# Patient Record
Sex: Female | Born: 2000 | Race: White | Hispanic: Yes | Marital: Single | Smoking: Former smoker
Health system: Southern US, Community
[De-identification: ages and names within clinical notes are randomized; demographics above are authoritative.]

## PROBLEM LIST (undated history)

## (undated) DIAGNOSIS — Z789 Other specified health status: Secondary | ICD-10-CM

## (undated) DIAGNOSIS — F32A Depression, unspecified: Secondary | ICD-10-CM

## (undated) DIAGNOSIS — H539 Unspecified visual disturbance: Secondary | ICD-10-CM

## (undated) DIAGNOSIS — F329 Major depressive disorder, single episode, unspecified: Secondary | ICD-10-CM

## (undated) DIAGNOSIS — F419 Anxiety disorder, unspecified: Secondary | ICD-10-CM

## (undated) HISTORY — PX: WISDOM TOOTH EXTRACTION: SHX21

## (undated) HISTORY — DX: Depression, unspecified: F32.A

## (undated) HISTORY — DX: Major depressive disorder, single episode, unspecified: F32.9

---
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
## 2019-01-13 ENCOUNTER — Ambulatory Visit
Admission: EM | Admit: 2019-01-13 | Discharge: 2019-01-13 | Disposition: A | Discharge status: Home / Self Care | Payer: 59

## 2019-01-13 ENCOUNTER — Other Ambulatory Visit: Payer: Self-pay

## 2019-01-13 DIAGNOSIS — Z0000 Encounter for general adult medical examination without abnormal findings: Secondary | ICD-10-CM

## 2019-01-13 NOTE — Discharge Instructions (Signed)
This is a normal finding of the tongue The bumps are most likely due to some food irritant Follow up as needed for continued or worsening symptoms

## 2019-01-13 NOTE — ED Triage Notes (Signed)
Pt c/o white bumps to back of tongue x2 days, no pain at this time. Eating and drinking ok, just wants to know what it is.

## 2019-01-13 NOTE — ED Provider Notes (Signed)
[facility]    CSN: [id number] Arrival date & time: 01/13/19  1503     History   Chief Complaint Chief Complaint  Patient presents with  . bumps on tongue    HPI Gail [name] is a 18 y.o. female.   Pt is an 18 year old female that presents with bumps on the back of her tongue. She noticed this Saturday. These bumps area not painful. No associated fever, chills, sore throat, ear pain. She is able to eat and drink without any issues.   ROS per HPI      Past Medical History:  Diagnosis Date  . Anxiety   . Depression   . Medical history non-contributory   . Vision abnormalities     Patient Active Problem List   Diagnosis Date Noted  . Initiation of Depo Provera 09/24/2018  . MDD (major depressive disorder), single episode, severe with psychosis (HCC) 05/03/2018  . Severe major depression with psychotic features, mood-congruent (HCC) 04/28/2018  . Suicide ideation 04/28/2018  . GAD (generalized anxiety disorder) 04/28/2018    Past Surgical History:  Procedure Laterality Date  . WISDOM TOOTH EXTRACTION      OB History    Gravida  0   Para  0   Term  0   Preterm  0   AB  0   Living  0     SAB  0   TAB  0   Ectopic  0   Multiple  0   Live Births  0            Home Medications    Prior to Admission medications   Medication Sig Start Date End Date Taking? Authorizing Provider  ARIPiprazole (ABILIFY) 5 MG tablet Take 0.5 tablets (2.5 mg total) by mouth at bedtime. 05/04/18   Denzil Magnusonhomas, Lashunda, NP  escitalopram (LEXAPRO) 10 MG tablet Take 1 tablet (10 mg total) by mouth daily. 05/05/18   Denzil Magnusonhomas, Lashunda, NP  hydrOXYzine (ATARAX/VISTARIL) 25 MG tablet Take 1 tablet (25 mg total) by mouth at bedtime as needed and may repeat dose one time if needed for anxiety (insomnia.). 05/04/18   Denzil Magnusonhomas, Lashunda, NP  medroxyPROGESTERone (DEPO-PROVERA) 150 MG/ML injection Inject 1 mL (150 mg total) into the muscle every 3 (three)  months. 11/19/18   Genia DelLavoie, [place], MD  ondansetron (ZOFRAN-ODT) 4 MG disintegrating tablet Take 1 tablet (4 mg total) by mouth every 8 (eight) hours as needed for nausea or vomiting. 10/24/18   Wieters, Junius CreamerHallie C, PA-C    Family History Family History  Problem Relation Age of Onset  . Rheum arthritis Mother   . Lupus Mother   . Obesity Mother   . Hypertension Mother   . Healthy Sister   . Breast cancer Maternal Grandmother   . Breast cancer Cousin     Social History Social History   Tobacco Use  . Smoking status: Never Smoker  . Smokeless tobacco: Never Used  Substance Use Topics  . Alcohol use: Not Currently    Frequency: Never  . Drug use: Never     Allergies   Patient has no known allergies.   Review of Systems Review of Systems   Physical Exam Triage Vital Signs ED Triage Vitals  Enc Vitals Group     BP 01/13/19 1519 106/72     Pulse Rate 01/13/19 1519 75     Resp 01/13/19 1519 16     Temp 01/13/19 1519 98.1 F (36.7 C)  Temp Source 01/13/19 1519 Oral     SpO2 01/13/19 1519 98 %     Weight --      Height --      Head Circumference --      Peak Flow --      Pain Score 01/13/19 1538 5     Pain Loc --      Pain Edu? --      Excl. in GC? --    No data found.  Updated Vital Signs BP 106/72 (BP Location: Left Arm)   Pulse 75   Temp 98.1 F (36.7 C) (Oral)   Resp 16   SpO2 98%   Visual Acuity Right Eye Distance:   Left Eye Distance:   Bilateral Distance:    Right Eye Near:   Left Eye Near:    Bilateral Near:     Physical Exam Vitals signs and nursing note reviewed.  Constitutional:      Appearance: Normal appearance.  HENT:     Head: Normocephalic and atraumatic.     Mouth/Throat:     Lips: Pink. No lesions.     Mouth: Mucous membranes are moist. No oral lesions.     Tongue: No lesions. Tongue does not deviate from midline.     Palate: No mass and lesions.     Pharynx: Oropharynx is clear. No pharyngeal swelling, oropharyngeal  exudate, posterior oropharyngeal erythema or uvula swelling.     Tonsils: No tonsillar exudate or tonsillar abscesses. 0 on the right. 0 on the left.     Comments: Bumps noted to the back of tongue. Normal finding.  Neurological:     Mental Status: She is alert.      UC Treatments / Results  Labs (all labs ordered are listed, but only abnormal results are displayed) Labs Reviewed - No data to display  EKG None  Radiology No results found.  Procedures Procedures (including critical care time)  Medications Ordered in UC Medications - No data to display  Initial Impression / Assessment and Plan / UC Course  I have reviewed the triage vital signs and the nursing notes.  Pertinent labs & imaging results that were available during my care of the patient were reviewed by me and considered in my medical decision making (see chart for details).     Reassured patient this is a normal finding. Follow up as needed for continued or worsening symptoms  Final Clinical Impressions(s) / UC Diagnoses   Final diagnoses:  Normal exam     Discharge Instructions     This is a normal finding of the tongue The bumps are most likely due to some food irritant Follow up as needed for continued or worsening symptoms     ED Prescriptions    None     Controlled Substance Prescriptions [place] Controlled Substance Registry consulted? Not Applicable   Janace Aris, NP 01/13/19 [phone]

## 2019-05-27 ENCOUNTER — Encounter (HOSPITAL_COMMUNITY): Payer: Self-pay | Admitting: Emergency Medicine

## 2019-05-27 ENCOUNTER — Other Ambulatory Visit: Payer: Self-pay

## 2019-05-27 ENCOUNTER — Emergency Department (HOSPITAL_COMMUNITY): Payer: 59

## 2019-05-27 ENCOUNTER — Emergency Department (HOSPITAL_COMMUNITY)
Admission: EM | Admit: 2019-05-27 | Discharge: 2019-05-27 | Disposition: A | Discharge status: Home / Self Care | Payer: 59 | Attending: Emergency Medicine | Admitting: Emergency Medicine

## 2019-05-27 DIAGNOSIS — Z79899 Other long term (current) drug therapy: Secondary | ICD-10-CM | POA: Diagnosis not present

## 2019-05-27 DIAGNOSIS — R1013 Epigastric pain: Secondary | ICD-10-CM | POA: Diagnosis not present

## 2019-05-27 DIAGNOSIS — R112 Nausea with vomiting, unspecified: Secondary | ICD-10-CM

## 2019-05-27 DIAGNOSIS — R634 Abnormal weight loss: Secondary | ICD-10-CM | POA: Diagnosis not present

## 2019-05-27 DIAGNOSIS — R1011 Right upper quadrant pain: Secondary | ICD-10-CM

## 2019-05-27 LAB — CBC
HCT: 40.9 % (ref 36.0–46.0)
Hemoglobin: 13 g/dL (ref 12.0–15.0)
MCH: 30.6 pg (ref 26.0–34.0)
MCHC: 31.8 g/dL (ref 30.0–36.0)
MCV: 96.2 fL (ref 80.0–100.0)
Platelets: 318 10*3/uL (ref 150–400)
RBC: 4.25 MIL/uL (ref 3.87–5.11)
RDW: 12.2 % (ref 11.5–15.5)
WBC: 6.5 10*3/uL (ref 4.0–10.5)
nRBC: 0 % (ref 0.0–0.2)

## 2019-05-27 LAB — URINALYSIS, ROUTINE W REFLEX MICROSCOPIC
Bilirubin Urine: NEGATIVE
Glucose, UA: NEGATIVE mg/dL
Ketones, ur: NEGATIVE mg/dL
Leukocytes,Ua: NEGATIVE
Nitrite: NEGATIVE
Protein, ur: NEGATIVE mg/dL
Specific Gravity, Urine: 1.021 (ref 1.005–1.030)
pH: 6 (ref 5.0–8.0)

## 2019-05-27 LAB — COMPREHENSIVE METABOLIC PANEL
ALT: 13 U/L (ref 0–44)
AST: 15 U/L (ref 15–41)
Albumin: 4 g/dL (ref 3.5–5.0)
Alkaline Phosphatase: 46 U/L (ref 38–126)
Anion gap: 9 (ref 5–15)
BUN: 12 mg/dL (ref 6–20)
CO2: 26 mmol/L (ref 22–32)
Calcium: 9.3 mg/dL (ref 8.9–10.3)
Chloride: 102 mmol/L (ref 98–111)
Creatinine, Ser: 0.86 mg/dL (ref 0.44–1.00)
GFR calc Af Amer: >60 mL/min (ref >60)
GFR calc non Af Amer: >60 mL/min (ref >60)
Glucose, Bld: 88 mg/dL (ref 70–99)
Potassium: 3.9 mmol/L (ref 3.5–5.1)
Sodium: 137 mmol/L (ref 135–145)
Total Bilirubin: 1.6 mg/dL — ABNORMAL HIGH (ref 0.3–1.2)
Total Protein: 7 g/dL (ref 6.5–8.1)

## 2019-05-27 LAB — LIPASE, BLOOD: Lipase: 40 U/L (ref 11–51)

## 2019-05-27 LAB — I-STAT BETA HCG BLOOD, ED (MC, WL, AP ONLY): I-stat hCG, quantitative: <5 m[IU]/mL (ref <5)

## 2019-05-27 MED ORDER — FAMOTIDINE IN NACL 20-0.9 MG/50ML-% IV SOLN
20.0000 mg | Freq: Once | INTRAVENOUS | Status: AC
  Administered 2019-05-27: 20 mg via INTRAVENOUS
  Filled 2019-05-27: qty 50

## 2019-05-27 MED ORDER — FAMOTIDINE 20 MG PO TABS: 20.0000 mg | ORAL_TABLET | Freq: Every day | ORAL | 0 refills | Status: DC

## 2019-05-27 MED ORDER — SODIUM CHLORIDE 0.9 % IV BOLUS
1000.0000 mL | Freq: Once | INTRAVENOUS | Status: AC
  Administered 2019-05-27: 1000 mL via INTRAVENOUS

## 2019-05-27 MED ORDER — SODIUM CHLORIDE 0.9% FLUSH: 3.0000 mL | Freq: Once | INTRAVENOUS | Status: DC

## 2019-05-27 MED ORDER — PROMETHAZINE HCL 25 MG PO TABS: 25.0000 mg | ORAL_TABLET | Freq: Four times a day (QID) | ORAL | 0 refills | Status: DC | PRN

## 2019-05-27 MED ORDER — ONDANSETRON HCL 4 MG/2ML IJ SOLN
4.0000 mg | Freq: Once | INTRAMUSCULAR | Status: AC
  Administered 2019-05-27: 4 mg via INTRAVENOUS
  Filled 2019-05-27: qty 2

## 2019-05-27 NOTE — ED Triage Notes (Signed)
Pt complains of abd pain and vomiting that has been going on since Monday. Pt has lost weight 112lb-99lb in a week.

## 2019-05-27 NOTE — ED Provider Notes (Signed)
[facility] Note   CSN: [id number] Arrival date & time: 05/27/19  1700     History   Chief Complaint Chief Complaint  Patient presents with  . Emesis  . Abdominal Pain    HPI Gail [name] is a 18 y.o. female.     Pt presents to the ED today abdominal pain and n/v.  The pt said she's been having these sx for the past week.  She's lost weight (different scales).  There was a little bit of blood in her emesis today.  She initially went to UC who sent her here.  UC did a covid swab while there.  No f/c.  The pt has been taking Zofran, but it is not helping.     Past Medical History:  Diagnosis Date  . Anxiety   . Depression   . Medical history non-contributory   . Vision abnormalities     Patient Active Problem List   Diagnosis Date Noted  . Initiation of Depo Provera 09/24/2018  . MDD (major depressive disorder), single episode, severe with psychosis (HCC) 05/03/2018  . Severe major depression with psychotic features, mood-congruent (HCC) 04/28/2018  . Suicide ideation 04/28/2018  . GAD (generalized anxiety disorder) 04/28/2018    Past Surgical History:  Procedure Laterality Date  . WISDOM TOOTH EXTRACTION       OB History    Gravida  0   Para  0   Term  0   Preterm  0   AB  0   Living  0     SAB  0   TAB  0   Ectopic  0   Multiple  0   Live Births  0            Home Medications    Prior to Admission medications   Medication Sig Start Date End Date Taking? Authorizing Provider  ARIPiprazole (ABILIFY) 5 MG tablet Take 0.5 tablets (2.5 mg total) by mouth at bedtime. 05/04/18   Denzil Magnusonhomas, Lashunda, NP  escitalopram (LEXAPRO) 10 MG tablet Take 1 tablet (10 mg total) by mouth daily. 05/05/18   Denzil Magnusonhomas, Lashunda, NP  famotidine (PEPCID) 20 MG tablet Take 1 tablet (20 mg total) by mouth daily. 05/27/19   Jacalyn LefevreHaviland, Jireh Elmore, MD  hydrOXYzine (ATARAX/VISTARIL) 25 MG tablet Take 1 tablet (25 mg  total) by mouth at bedtime as needed and may repeat dose one time if needed for anxiety (insomnia.). 05/04/18   Denzil Magnusonhomas, Lashunda, NP  medroxyPROGESTERone (DEPO-PROVERA) 150 MG/ML injection Inject 1 mL (150 mg total) into the muscle every 3 (three) months. 11/19/18   Genia DelLavoie, Marie-Lyne, MD  ondansetron (ZOFRAN-ODT) 4 MG disintegrating tablet Take 1 tablet (4 mg total) by mouth every 8 (eight) hours as needed for nausea or vomiting. 10/24/18   Wieters, Hallie C, PA-C  promethazine (PHENERGAN) 25 MG tablet Take 1 tablet (25 mg total) by mouth every 6 (six) hours as needed for nausea or vomiting. 05/27/19   Jacalyn LefevreHaviland, Cia Garretson, MD    Family History Family History  Problem Relation Age of Onset  . Rheum arthritis Mother   . Lupus Mother   . Obesity Mother   . Hypertension Mother   . Healthy Sister   . Breast cancer Maternal Grandmother   . Breast cancer Cousin     Social History Social History   Tobacco Use  . Smoking status: Never Smoker  . Smokeless tobacco: Never Used  Substance Use Topics  . Alcohol use: Not Currently  Frequency: Never  . Drug use: Never     Allergies   Patient has no known allergies.   Review of Systems Review of Systems  Gastrointestinal: Positive for abdominal pain, nausea and vomiting.  All other systems reviewed and are negative.    Physical Exam Updated Vital Signs BP 113/73   Pulse 77   Temp 98.4 F (36.9 C) (Oral)   Resp 16   Ht 5\' 2"  (1.575 m)   Wt 44.9 kg   SpO2 100%   BMI 18.11 kg/m   Physical Exam Vitals signs and nursing note reviewed.  Constitutional:      Appearance: She is well-developed.  HENT:     Head: Normocephalic and atraumatic.  Eyes:     Extraocular Movements: Extraocular movements intact.     Pupils: Pupils are equal, round, and reactive to light.  Cardiovascular:     Rate and Rhythm: Normal rate and regular rhythm.  Pulmonary:     Effort: Pulmonary effort is normal.     Breath sounds: Normal breath sounds.   Abdominal:     General: Abdomen is flat and scaphoid. Bowel sounds are normal.     Palpations: Abdomen is soft.     Tenderness: There is abdominal tenderness in the epigastric area.  Skin:    General: Skin is warm.     Capillary Refill: Capillary refill takes less than 2 seconds.  Neurological:     General: No focal deficit present.     Mental Status: She is alert and oriented to person, place, and time.  Psychiatric:        Mood and Affect: Mood normal.        Behavior: Behavior normal.      ED Treatments / Results  Labs (all labs ordered are listed, but only abnormal results are displayed) Labs Reviewed  COMPREHENSIVE METABOLIC PANEL - Abnormal; Notable for the following components:      Result Value   Total Bilirubin 1.6 (*)    All other components within normal limits  URINALYSIS, ROUTINE W REFLEX MICROSCOPIC - Abnormal; Notable for the following components:   APPearance HAZY (*)    Hgb urine dipstick SMALL (*)    Bacteria, UA MANY (*)    All other components within normal limits  LIPASE, BLOOD  CBC  I-STAT BETA HCG BLOOD, ED ([facility], [facility], [facility] ONLY)    EKG None  Radiology Abdomen Limited Ruq  Result Date: 05/27/2019 CLINICAL DATA:  Right upper quadrant pain EXAM: ULTRASOUND ABDOMEN LIMITED RIGHT UPPER QUADRANT COMPARISON:  None. FINDINGS: Gallbladder: No gallstones or wall thickening visualized. No sonographic Murphy sign noted by sonographer. Common bile duct: Diameter: 2.3 mm Liver: No focal lesion identified. Within normal limits in parenchymal echogenicity. Portal vein is patent on color Doppler imaging with normal direction of blood flow towards the liver. Other: None. IMPRESSION: Normal gallbladder and liver. Electronically Signed   By: 07/27/2019 M.D.   On: 05/27/2019 22:13    Procedures Procedures (including critical care time)  Medications Ordered in ED Medications  sodium chloride flush (NS) 0.9 % injection 3 mL (3 mLs Intravenous Not Given 05/27/19 2004)   famotidine (PEPCID) IVPB 20 mg premix (has no administration in time range)  sodium chloride 0.9 % bolus 1,000 mL (1,000 mLs Intravenous New Bag/Given 05/27/19 2149)  ondansetron (ZOFRAN) injection 4 mg (4 mg Intravenous Given 05/27/19 2150)     Initial Impression / Assessment and Plan / ED Course  I have reviewed the triage vital  signs and the nursing notes.  Pertinent labs & imaging results that were available during my care of the patient were reviewed by me and considered in my medical decision making (see chart for details).   Pt is feeling better after treatment.    GB US nl.  Pt's blood in emesis likely a small mallory-weiss tear.  Pt has had this periodic n/v and epigastric abd pain for awhile.  No fever or elevation in wbc, so I don't think pt needs a ct scan.  Pt has never seen a GI doctor, so she will be referred to one.  She will also be started on pepcid.  Zofran is not helping, so I will put her on phenergan.  She knows to return if worse.  Final Clinical Impressions(s) / ED Diagnoses   Final diagnoses:  RUQ pain  Non-intractable vomiting with nausea, unspecified vomiting type    ED Discharge Orders         Ordered    Ambulatory referral to Gastroenterology     05/27/19 2227    famotidine (PEPCID) 20 MG tablet  Daily     05/27/19 2228    promethazine (PHENERGAN) 25 MG tablet  Every 6 hours PRN     05/27/19 2228           Isla Pence, MD 05/27/19 2229

## 2019-05-27 NOTE — ED Notes (Signed)
Patient verbalizes understanding of discharge instructions. Opportunity for questioning and answers were provided. Armband removed by staff, pt discharged from ED ambulatory to home.  

## 2019-05-27 NOTE — ED Notes (Signed)
Patient transported to Ultrasound 

## 2019-05-27 NOTE — ED Notes (Signed)
Urine Culture sent to Main Lab with UA 

## 2019-05-28 ENCOUNTER — Encounter: Payer: Self-pay | Admitting: Gastroenterology

## 2019-06-01 ENCOUNTER — Emergency Department (HOSPITAL_COMMUNITY)
Admission: EM | Admit: 2019-06-01 | Discharge: 2019-06-01 | Disposition: A | Discharge status: Home / Self Care | Payer: 59 | Attending: Emergency Medicine | Admitting: Emergency Medicine

## 2019-06-01 ENCOUNTER — Other Ambulatory Visit: Payer: Self-pay

## 2019-06-01 DIAGNOSIS — Z79899 Other long term (current) drug therapy: Secondary | ICD-10-CM | POA: Insufficient documentation

## 2019-06-01 DIAGNOSIS — R112 Nausea with vomiting, unspecified: Secondary | ICD-10-CM | POA: Insufficient documentation

## 2019-06-01 DIAGNOSIS — R1013 Epigastric pain: Secondary | ICD-10-CM | POA: Insufficient documentation

## 2019-06-01 LAB — COMPREHENSIVE METABOLIC PANEL
ALT: 14 U/L (ref 0–44)
AST: 19 U/L (ref 15–41)
Albumin: 4.1 g/dL (ref 3.5–5.0)
Alkaline Phosphatase: 47 U/L (ref 38–126)
Anion gap: 10 (ref 5–15)
BUN: 9 mg/dL (ref 6–20)
CO2: 23 mmol/L (ref 22–32)
Calcium: 9.7 mg/dL (ref 8.9–10.3)
Chloride: 103 mmol/L (ref 98–111)
Creatinine, Ser: 0.97 mg/dL (ref 0.44–1.00)
GFR calc Af Amer: >60 mL/min (ref >60)
GFR calc non Af Amer: >60 mL/min (ref >60)
Glucose, Bld: 89 mg/dL (ref 70–99)
Potassium: 3.8 mmol/L (ref 3.5–5.1)
Sodium: 136 mmol/L (ref 135–145)
Total Bilirubin: 2.5 mg/dL — ABNORMAL HIGH (ref 0.3–1.2)
Total Protein: 7.2 g/dL (ref 6.5–8.1)

## 2019-06-01 LAB — URINALYSIS, ROUTINE W REFLEX MICROSCOPIC
Bilirubin Urine: NEGATIVE
Glucose, UA: NEGATIVE mg/dL
Ketones, ur: NEGATIVE mg/dL
Leukocytes,Ua: NEGATIVE
Nitrite: NEGATIVE
Protein, ur: NEGATIVE mg/dL
Specific Gravity, Urine: 1.009 (ref 1.005–1.030)
pH: 6 (ref 5.0–8.0)

## 2019-06-01 LAB — I-STAT BETA HCG BLOOD, ED (MC, WL, AP ONLY): I-stat hCG, quantitative: <5 m[IU]/mL (ref <5)

## 2019-06-01 LAB — LIPASE, BLOOD: Lipase: 32 U/L (ref 11–51)

## 2019-06-01 LAB — CBC
HCT: 39.9 % (ref 36.0–46.0)
Hemoglobin: 13.2 g/dL (ref 12.0–15.0)
MCH: 31.1 pg (ref 26.0–34.0)
MCHC: 33.1 g/dL (ref 30.0–36.0)
MCV: 94.1 fL (ref 80.0–100.0)
Platelets: 300 10*3/uL (ref 150–400)
RBC: 4.24 MIL/uL (ref 3.87–5.11)
RDW: 12.2 % (ref 11.5–15.5)
WBC: 5.9 10*3/uL (ref 4.0–10.5)
nRBC: 0 % (ref 0.0–0.2)

## 2019-06-01 MED ORDER — ONDANSETRON 4 MG PO TBDP: 4.0000 mg | ORAL_TABLET | Freq: Three times a day (TID) | ORAL | 0 refills | Status: DC | PRN

## 2019-06-01 MED ORDER — SODIUM CHLORIDE 0.9% FLUSH: 3.0000 mL | Freq: Once | INTRAVENOUS | Status: DC

## 2019-06-01 NOTE — ED Provider Notes (Signed)
[facility] EMERGENCY DEPARTMENT Provider Note   CSN: [id number] Arrival date & time: 06/01/19  4970     History   Chief Complaint Chief Complaint  Patient presents with  . Abdominal Pain  . Emesis    HPI Gail [name] is a 18 y.o. female with a history of anxiety and depression presenting to the emergency department with abdominal pain and vomiting.  The patient reports is been going on for "weeks" the patient was seen on October 8 in our ER for similar symptoms, had negative workup including  RUQ ultrasound after noting isolated elevated T Bili level on blood testing.  Discharged home on pepcid.  Now returning complaining of continued difficulty eating.  Feels that everything she eats and drinks "Comes up" immediately.  States she has not kept anything down in "days."  Unsure of her last bowel movement.  She is passing gas.  She has epigastric pain that is cramping and comes and goes, does not radiate.  Denies fevers, chills, dysuria, hematuria.     HPI  Past Medical History:  Diagnosis Date  . Anxiety   . Depression   . Medical history non-contributory   . Vision abnormalities     Patient Active Problem List   Diagnosis Date Noted  . Initiation of Depo Provera 09/24/2018  . MDD (major depressive disorder), single episode, severe with psychosis ([place]) 05/03/2018  . Severe major depression with psychotic features, mood-congruent ([place]) 04/28/2018  . Suicide ideation 04/28/2018  . GAD (generalized anxiety disorder) 04/28/2018    Past Surgical History:  Procedure Laterality Date  . WISDOM TOOTH EXTRACTION       OB History    Gravida  0   Para  0   Term  0   Preterm  0   AB  0   Living  0     SAB  0   TAB  0   Ectopic  0   Multiple  0   Live Births  0            Home Medications    Prior to Admission medications   Medication Sig Start Date End Date Taking? Authorizing Provider  ARIPiprazole (ABILIFY) 5 MG  tablet Take 0.5 tablets (2.5 mg total) by mouth at bedtime. 05/04/18   Mordecai Maes, NP  escitalopram (LEXAPRO) 10 MG tablet Take 1 tablet (10 mg total) by mouth daily. 05/05/18   Mordecai Maes, NP  famotidine (PEPCID) 20 MG tablet Take 1 tablet (20 mg total) by mouth daily. 05/27/19   Isla Pence, MD  hydrOXYzine (ATARAX/VISTARIL) 25 MG tablet Take 1 tablet (25 mg total) by mouth at bedtime as needed and may repeat dose one time if needed for anxiety (insomnia.). 05/04/18   Mordecai Maes, NP  medroxyPROGESTERone (DEPO-PROVERA) 150 MG/ML injection Inject 1 mL (150 mg total) into the muscle every 3 (three) months. 11/19/18   Princess Bruins, MD  ondansetron (ZOFRAN ODT) 4 MG disintegrating tablet Take 1 tablet (4 mg total) by mouth every 8 (eight) hours as needed for up to 15 doses for nausea or vomiting. 06/01/19   Wyvonnia Dusky, MD  ondansetron (ZOFRAN-ODT) 4 MG disintegrating tablet Take 1 tablet (4 mg total) by mouth every 8 (eight) hours as needed for nausea or vomiting. 10/24/18   Wieters, Hallie C, PA-C  promethazine (PHENERGAN) 25 MG tablet Take 1 tablet (25 mg total) by mouth every 6 (six) hours as needed for nausea or vomiting. 05/27/19   Isla Pence,  MD    Family History Family History  Problem Relation Age of Onset  . Rheum arthritis Mother   . Lupus Mother   . Obesity Mother   . Hypertension Mother   . Healthy Sister   . Breast cancer Maternal Grandmother   . Breast cancer Cousin     Social History Social History   Tobacco Use  . Smoking status: Never Smoker  . Smokeless tobacco: Never Used  Substance Use Topics  . Alcohol use: Not Currently    Frequency: Never  . Drug use: Never     Allergies   Patient has no known allergies.   Review of Systems Review of Systems  Constitutional: Negative for chills and fever.  Respiratory: Negative for cough and shortness of breath.   Cardiovascular: Negative for chest pain and palpitations.  Gastrointestinal:  Positive for abdominal pain, nausea and vomiting. Negative for diarrhea.  Genitourinary: Negative for dysuria and hematuria.  Musculoskeletal: Negative for arthralgias and back pain.  Skin: Negative for pallor and rash.  Neurological: Negative for syncope and light-headedness.  All other systems reviewed and are negative.    Physical Exam Updated Vital Signs BP 113/89 (BP Location: Left Arm)   Pulse 67   Temp 98.4 F (36.9 C) (Oral)   Resp 16   SpO2 100%   Physical Exam Vitals signs and nursing note reviewed.  Constitutional:      General: She is not in acute distress.    Appearance: She is well-developed.     Comments: Flat affect, does not make eye contact  HENT:     Head: Normocephalic and atraumatic.  Eyes:     Conjunctiva/sclera: Conjunctivae normal.  Neck:     Musculoskeletal: Neck supple.  Cardiovascular:     Rate and Rhythm: Normal rate and regular rhythm.     Heart sounds: No murmur.  Pulmonary:     Effort: Pulmonary effort is normal. No respiratory distress.     Breath sounds: Normal breath sounds.  Abdominal:     Palpations: Abdomen is soft.     Tenderness: There is abdominal tenderness (mild) in the epigastric area. There is no right CVA tenderness, left CVA tenderness, guarding or rebound. Negative signs include Murphy's sign, Rovsing's sign and McBurney's sign.  Skin:    General: Skin is warm and dry.  Neurological:     Mental Status: She is alert.      ED Treatments / Results  Labs (all labs ordered are listed, but only abnormal results are displayed) Labs Reviewed  COMPREHENSIVE METABOLIC PANEL - Abnormal; Notable for the following components:      Result Value   Total Bilirubin 2.5 (*)    All other components within normal limits  URINALYSIS, ROUTINE W REFLEX MICROSCOPIC - Abnormal; Notable for the following components:   Hgb urine dipstick SMALL (*)    Bacteria, UA MANY (*)    All other components within normal limits  LIPASE, BLOOD  CBC   I-STAT BETA HCG BLOOD, ED ([facility], [facility], [facility] ONLY)    EKG None  Radiology No results found.  Procedures Procedures (including critical care time)  Medications Ordered in ED Medications - No data to display   Initial Impression / Assessment and Plan / ED Course  I have reviewed the triage vital signs and the nursing notes.  Pertinent labs & imaging results that were available during my care of the patient were reviewed by me and considered in my medical decision making (see chart for details).  18 yo female presenting with recurrent ED visit for epigastric abdominal pain and nausea vomiting, triggered by eating.  Symptoms and presentation most consistent with gastroparesis. She is quite comfortable and well appearing on exam. Her labs show no evidence of dehydration or electrolyte abnormalities to correlate with her statement that she has not been able to eat or drink anything at all in "days."  I explained that this is likely a chronic process, she needs to see a GI doctor.  We can offer her zofran for nausea.  Keep drinking fluids, milkshakes, Ensure or Boost, and avoid heavy foods.  Pregnancy negative No leukocytosis or fever on labs today or previously to suggest acute intraabdominal process such as appendicitis  RUQ negative on last visit, low suspicion for cholecystitis    Will discharge.  Clinical Course as of Jun 01 1935  Tue Jun 01, 2019  1119 I-stat hCG, quantitative: <5.0 [MT]  1119 WBC: 5.9 [MT]  1119 Ultrasound RUQ on 10/8 showed: FINDINGS: Gallbladder:  No gallstones or wall thickening visualized. No sonographic Murphy sign noted by sonographer.  Common bile duct:  Diameter: 2.3 mm   [MT]    Clinical Course User Index [MT] Terald Sleeper, MD     Final Clinical Impressions(s) / ED Diagnoses   Final diagnoses:  Epigastric pain  Non-intractable vomiting with nausea, unspecified vomiting type    ED Discharge Orders         Ordered    ondansetron  (ZOFRAN ODT) 4 MG disintegrating tablet  Every 8 hours PRN     06/01/19 1142           Terald Sleeper, MD 06/01/19 1941

## 2019-06-01 NOTE — ED Notes (Signed)
Patient Alert and oriented to baseline. Stable and ambulatory to baseline. Patient verbalized understanding of the discharge instructions.  Patient belongings were taken by the patient.   

## 2019-06-01 NOTE — ED Triage Notes (Signed)
Pt reports ongoing abdominal pain and emesis over weekend. Denies recent fever. Had COVID test last Thursday. Doesn't have results. VSS. NAD at present.

## 2019-06-01 NOTE — Discharge Instructions (Addendum)
It is possible you have gastroparesis.  You should see a GI doctor for this.  Please call back their office and let them know you were seen in the ER twice, and ask if they can move up your office appointment.  At home, eat fluids and smoothies, liquids and soft food.  Eat SLOWLY.    Your bloodwork did not show signs of dehydration or infection.

## 2019-06-07 ENCOUNTER — Ambulatory Visit (INDEPENDENT_AMBULATORY_CARE_PROVIDER_SITE_OTHER): Payer: 59 | Admitting: Nurse Practitioner

## 2019-06-07 ENCOUNTER — Encounter: Payer: Self-pay | Admitting: Nurse Practitioner

## 2019-06-07 ENCOUNTER — Other Ambulatory Visit: Payer: Self-pay

## 2019-06-07 VITALS — BP 102/80 | HR 75 | Temp 98.3°F | Ht 61.89 in | Wt 99.8 lb

## 2019-06-07 DIAGNOSIS — Z23 Encounter for immunization: Secondary | ICD-10-CM | POA: Diagnosis not present

## 2019-06-07 DIAGNOSIS — R1013 Epigastric pain: Secondary | ICD-10-CM

## 2019-06-07 DIAGNOSIS — Z30013 Encounter for initial prescription of injectable contraceptive: Secondary | ICD-10-CM

## 2019-06-07 DIAGNOSIS — K5904 Chronic idiopathic constipation: Secondary | ICD-10-CM

## 2019-06-07 MED ORDER — MEDROXYPROGESTERONE ACETATE 150 MG/ML IM SUSP
150.0000 mg | Freq: Once | INTRAMUSCULAR | Status: AC
  Administered 2019-06-07: 150 mg via INTRAMUSCULAR

## 2019-06-07 MED ORDER — ONDANSETRON 4 MG PO TBDP: 4.0000 mg | ORAL_TABLET | Freq: Three times a day (TID) | ORAL | 0 refills | Status: DC | PRN

## 2019-06-07 MED ORDER — FAMOTIDINE 20 MG PO TABS: 20.0000 mg | ORAL_TABLET | Freq: Two times a day (BID) | ORAL | 0 refills | Status: DC

## 2019-06-07 MED ORDER — DOCUSATE SODIUM 100 MG PO CAPS: 100.0000 mg | ORAL_CAPSULE | Freq: Every day | ORAL | 0 refills | Status: DC

## 2019-06-07 NOTE — Progress Notes (Signed)
Subjective:  Patient ID: Gail [name], female    DOB: 19-Aug-2001  Age: 18 y.o. MRN: [id number]  CC: Establish Care (est care/follow up from ED/still has abd pain,nausea,vomitting,not able to eat,contipiation/goingon 2 wks/ cant sleep at night)  HPI Gail [name] is accompanied by her mother-Tanya. GI symptoms: chronic, ongoing for over 1year, worsening in last 59months. Reports anorexia, nausea, vomiting, ABD pain (describes as fullness), and constipation. Had 2 ED visits in last 2weeks. labs and radiology report reviewed: all normal except elevated bilirubin. She has not tried zofran prior to food. Gail [name] state she si only able to tolerate jello. She is unable to remember last BM, use of OTC laxative with no results.  Anxiety and Depression: Admits to non compliance with abilify, lexapro and vistaril due to side effects. Reports excessive daytime somnolence with all medications. Has not taken any in last 71months. Medications were previous by [facility]. Last seen by psychiatry 20months ago. Has appt with therapist. Has difficulty completing school while staying on campus. Hx of inpatient admission due to suicidal thoughts and auditory hallucinations. Hx of attempted suicide: overdose on mediation. She denies any ETOH or illicit drug or tobacco use. She is not sexually active at this time. Depression screen [facility] 2/9 06/07/2019 06/07/2019 09/23/2018  Decreased Interest 2 3 1   Down, Depressed, Hopeless 1 3 1   PHQ - 2 Score 3 6 2   Altered sleeping 3 - 3  Tired, decreased energy 3 - 1  Change in appetite 3 - 0  Feeling bad or failure about yourself  1 - 1  Trouble concentrating 3 - 0  Moving slowly or fidgety/restless 0 - 0  Suicidal thoughts 0 - 0  PHQ-9 Score 16 - 7   GAD 7 : Generalized Anxiety Score 06/07/2019 09/23/2018  Nervous, Anxious, on Edge 2 1  Control/stop worrying 2 1  Worry too much - different things 1 1  Trouble relaxing 3 1  Restless 2 0   Easily annoyed or irritable 3 1  Afraid - awful might happen 1 1  Total GAD 7 Score 14 6   Contraception management: Last depo Provera injection 10/2018, administered by GYN, last pregnancy test done in Ed 06/01/2019 (negative), LMP 79months ago.  Reviewed past Medical, Social and Family history today.  Outpatient Medications Prior to Visit  Medication Sig Dispense Refill  . promethazine (PHENERGAN) 25 MG tablet Take 1 tablet (25 mg total) by mouth every 6 (six) hours as needed for nausea or vomiting. 30 tablet 0  . ondansetron (ZOFRAN-ODT) 4 MG disintegrating tablet Take 1 tablet (4 mg total) by mouth every 8 (eight) hours as needed for nausea or vomiting. 20 tablet 0  . ARIPiprazole (ABILIFY) 5 MG tablet Take 0.5 tablets (2.5 mg total) by mouth at bedtime. (Patient not taking: Reported on 06/07/2019) 30 tablet 0  . escitalopram (LEXAPRO) 10 MG tablet Take 1 tablet (10 mg total) by mouth daily. (Patient not taking: Reported on 06/07/2019) 30 tablet 0  . hydrOXYzine (ATARAX/VISTARIL) 25 MG tablet Take 1 tablet (25 mg total) by mouth at bedtime as needed and may repeat dose one time if needed for anxiety (insomnia.). (Patient not taking: Reported on 06/07/2019) 30 tablet 0  . medroxyPROGESTERone (DEPO-PROVERA) 150 MG/ML injection Inject 1 mL (150 mg total) into the muscle every 3 (three) months. (Patient not taking: Reported on 06/07/2019) 1 mL 4  . famotidine (PEPCID) 20 MG tablet Take 1 tablet (20 mg total) by mouth daily. (Patient not taking: Reported on  06/07/2019) 30 tablet 0  . ondansetron (ZOFRAN ODT) 4 MG disintegrating tablet Take 1 tablet (4 mg total) by mouth every 8 (eight) hours as needed for up to 15 doses for nausea or vomiting. (Patient not taking: Reported on 06/07/2019) 15 tablet 0   No facility-administered medications prior to visit.     ROS See HPI  Objective:  BP 102/80   Pulse 75   Temp 98.3 F (36.8 C) (Tympanic)   Ht 5' 1.89" (1.572 m)   Wt 99 lb 12.8 oz (45.3  kg)   SpO2 97%   BMI 18.32 kg/m   BP Readings from Last 3 Encounters:  06/07/19 102/80  06/01/19 113/89  05/27/19 118/82    Wt Readings from Last 3 Encounters:  06/07/19 99 lb 12.8 oz (45.3 kg) (4 %, Z= -1.77)*  05/27/19 99 lb (44.9 kg) (3 %, Z= -1.84)*  11/19/18 103 lb (46.7 kg) (8 %, Z= -1.42)*   * Growth percentiles are based on CDC (Girls, 2-20 Years) data.   Physical Exam Neck:     Musculoskeletal: Normal range of motion and neck supple.  Cardiovascular:     Pulses: Normal pulses.     Heart sounds: Normal heart sounds.  Pulmonary:     Effort: Pulmonary effort is normal.     Breath sounds: Normal breath sounds.  Lymphadenopathy:     Cervical: No cervical adenopathy.    Lab Results  Component Value Date   WBC 5.9 06/01/2019   HGB 13.2 06/01/2019   HCT 39.9 06/01/2019   PLT 300 06/01/2019   GLUCOSE 89 06/01/2019   CHOL 125 04/28/2018   TRIG 58 04/28/2018   HDL 51 04/28/2018   LDLCALC 62 04/28/2018   ALT 14 06/01/2019   AST 19 06/01/2019   NA 136 06/01/2019   K 3.8 06/01/2019   CL 103 06/01/2019   CREATININE 0.97 06/01/2019   BUN 9 06/01/2019   CO2 23 06/01/2019   TSH 1.611 04/28/2018   HGBA1C 4.8 04/28/2018   Assessment & Plan:   Gail Folksmanda was seen today for establish care.  Diagnoses and all orders for this visit:  Initiation of Depo Provera -     medroxyPROGESTERone (DEPO-PROVERA) injection 150 mg  Dyspepsia -     famotidine (PEPCID) 20 MG tablet; Take 1 tablet (20 mg total) by mouth 2 (two) times daily. -     ondansetron (ZOFRAN ODT) 4 MG disintegrating tablet; Take 1 tablet (4 mg total) by mouth every 8 (eight) hours as needed for nausea or vomiting.  Chronic idiopathic constipation -     docusate sodium (COLACE) 100 MG capsule; Take 1 capsule (100 mg total) by mouth daily.  Need for influenza vaccination -     Flu Vaccine QUAD 36+ mos IM   I have discontinued Gail [name] ondansetron. I have also changed her famotidine and  ondansetron. Additionally, I am having her start on docusate sodium. Lastly, I am having her maintain her ARIPiprazole, escitalopram, hydrOXYzine, medroxyPROGESTERone, and promethazine. We administered medroxyPROGESTERone.  Meds ordered this encounter  Medications  . docusate sodium (COLACE) 100 MG capsule    Sig: Take 1 capsule (100 mg total) by mouth daily.    Dispense:  10 capsule    Refill:  0    Order Specific Question:   Supervising Provider    Answer:   Dianne DunARON, TALIA M [[id number]]  . famotidine (PEPCID) 20 MG tablet    Sig: Take 1 tablet (20 mg total) by mouth 2 (two) times  daily.    Dispense:  30 tablet    Refill:  0    Order Specific Question:   Supervising Provider    Answer:   Lucille Passy [[id number]]  . ondansetron (ZOFRAN ODT) 4 MG disintegrating tablet    Sig: Take 1 tablet (4 mg total) by mouth every 8 (eight) hours as needed for nausea or vomiting.    Dispense:  30 tablet    Refill:  0    Order Specific Question:   Supervising Provider    Answer:   Lucille Passy [[id number]]  . medroxyPROGESTERone (DEPO-PROVERA) injection 150 mg    Problem List Items Addressed This Visit      Other   Initiation of Depo Provera - Primary    Other Visit Diagnoses    Dyspepsia       Relevant Medications   famotidine (PEPCID) 20 MG tablet   ondansetron (ZOFRAN ODT) 4 MG disintegrating tablet   Chronic idiopathic constipation       Relevant Medications   docusate sodium (COLACE) 100 MG capsule   Need for influenza vaccination       Relevant Orders   Flu Vaccine QUAD 36+ mos IM (Completed)      Follow-up: Return if symptoms worsen or fail to improve.  Wilfred Lacy, NP

## 2019-06-07 NOTE — Patient Instructions (Addendum)
Maintain small frequent meals. Take zofran 15-39mins prior to food intake Start famotidine BID. Start colace daily. Maintain appt with GI.  Return to office in 8months for repeat depoprovera injection.  Make appt with [facility]ry asap: [facility] in [place]. Maintain appt with therapist.

## 2019-06-09 ENCOUNTER — Other Ambulatory Visit: Payer: Self-pay

## 2019-06-09 ENCOUNTER — Encounter: Payer: Self-pay | Admitting: Gastroenterology

## 2019-06-09 ENCOUNTER — Other Ambulatory Visit (INDEPENDENT_AMBULATORY_CARE_PROVIDER_SITE_OTHER): Payer: 59

## 2019-06-09 ENCOUNTER — Encounter: Payer: Self-pay | Admitting: Nurse Practitioner

## 2019-06-09 ENCOUNTER — Ambulatory Visit (INDEPENDENT_AMBULATORY_CARE_PROVIDER_SITE_OTHER): Payer: 59 | Admitting: Gastroenterology

## 2019-06-09 VITALS — BP 98/54 | HR 62 | Temp 98.1°F | Ht 61.0 in | Wt 101.0 lb

## 2019-06-09 DIAGNOSIS — R112 Nausea with vomiting, unspecified: Secondary | ICD-10-CM | POA: Diagnosis not present

## 2019-06-09 DIAGNOSIS — K5900 Constipation, unspecified: Secondary | ICD-10-CM

## 2019-06-09 DIAGNOSIS — K920 Hematemesis: Secondary | ICD-10-CM

## 2019-06-09 LAB — HEPATIC FUNCTION PANEL
ALT: 9 U/L (ref 0–35)
AST: 12 U/L (ref 0–37)
Albumin: 4.3 g/dL (ref 3.5–5.2)
Alkaline Phosphatase: 47 U/L (ref 47–119)
Bilirubin, Direct: 0.3 mg/dL (ref 0.0–0.3)
Total Bilirubin: 1.5 mg/dL — ABNORMAL HIGH (ref 0.3–1.2)
Total Protein: 6.9 g/dL (ref 6.0–8.3)

## 2019-06-09 LAB — BILIRUBIN, DIRECT: Bilirubin, Direct: 0.3 mg/dL (ref 0.0–0.3)

## 2019-06-09 MED ORDER — PANTOPRAZOLE SODIUM 40 MG PO TBEC: 40.0000 mg | DELAYED_RELEASE_TABLET | Freq: Two times a day (BID) | ORAL | 3 refills | Status: DC

## 2019-06-09 MED ORDER — LINACLOTIDE 145 MCG PO CAPS: 145.0000 ug | ORAL_CAPSULE | Freq: Every day | ORAL | 1 refills | Status: DC

## 2019-06-09 NOTE — Progress Notes (Signed)
Referring Provider: Bing Neighbors, FNP Primary Care Physician:  Anne Ng, NP  Reason for Consultation:  Nausea, vomiting, and abdomina pain   IMPRESSION:  Nausea and vomiting and midepigastric abdominal pain x 3 weeks Hematemesis x 1 Chronic constipation Elevated bilirubin with otherwise normal liver enzymes  Etiology of nausea, vomiting and midepigastric pain is unclear given the normal ultrasound. Differential includes H pylori, PUD, reflux esophagitis, gastritis. They may also be related to her constipation, although she does not feel there is a temporal association.  Discussed UGI and H pylori versus EGD. Patient and her mother strongly prefer EGD.  Will start empiric PPI therapy and work to treat her constipation.   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 Will repeat hepatic function panel today including a direct bilirubin.     PLAN: Pantoprazole 40 mg daily Hepatic function panel, direct bilirubin EGD High fiber diet, drink plenty of liquids, add daily stool bulking agent Trial of Linzess 145 mcg daily to treat her constipation  Please see the "Patient Instructions" section for addition details about the plan.  HPI: Gail [name] is a 18 y.o. female referred by the ED for further evaluation of abdominal pain, nausea, and vomiting. The history is obtained through the patient, review of her medical record, and her mother accompanies her to this appointment. She has a history of anxiety and depression, but using her Lexapro on a PRN basis.  3 weeks of sharp, non-radiating, mid-abdominal pain described as a "stomach ache."  Nausea and vomiting after eating, although nausea is present when she awakes. Intermittent blood in the emesis. Associated dizziness, sitophobia, early satiety and fullness/bloating. Symptoms can be so severe that it "doubles her over" and is unable to walk when most severe. No change in symptoms with eating, defecation,  or movement. Smelling food escalates her symptoms. Her Mom often finds her crying in the morning due to the pain.   Associated weight loss because she doesn't feel liks she is keeping any food down. Has lost 10 pounds. No appetite. Ate [employer] nuggets and fries prior to her appointment today but has  felt nausea since eating it. Symptoms are particularly worse with fried or greasy. Does not eat diary because she does not like it.  Eats jello. No other associated symptoms. No identified exacerbating or relieving features.   Seen in the ED 10/8/ and 10/13 for these symptoms. RUQ was normal. Labs including liver enzymes and lipase were normal except for a bilirubin.  Prescribed Pepcid, promethazine, and ondansetron. Taking them as standing doses. Pretreating 30 minutes prior to meals has not provided any relief.  No relief with PeptoBismal or ginger ale, either.   Chronic constipation. Has a bowel movement once or twice weekly. Currently taking Colace. No associated of constipation with nausea.   Prior abdominal imaging: Abdominal ultrasound 05/27/19: normal gallbladder and liver  No known family history of colon cancer or polyps. No family history of uterine/endometrial cancer, pancreatic cancer or gastric/stomach cancer.   Past Medical History:  Diagnosis Date  . Anxiety   . Depression   . Medical history non-contributory   . Vision abnormalities     Past Surgical History:  Procedure Laterality Date  . WISDOM TOOTH EXTRACTION      Current Outpatient Medications  Medication Sig Dispense Refill  . ARIPiprazole (ABILIFY) 5 MG tablet Take 0.5 tablets (2.5 mg total) by mouth at bedtime. 30 tablet 0  . docusate sodium (COLACE) 100 MG capsule Take 1 capsule (100 mg total)  by mouth daily. 10 capsule 0  . escitalopram (LEXAPRO) 10 MG tablet Take 1 tablet (10 mg total) by mouth daily. 30 tablet 0  . famotidine (PEPCID) 20 MG tablet Take 1 tablet (20 mg total) by mouth 2 (two) times daily. 30  tablet 0  . hydrOXYzine (ATARAX/VISTARIL) 25 MG tablet Take 1 tablet (25 mg total) by mouth at bedtime as needed and may repeat dose one time if needed for anxiety (insomnia.). 30 tablet 0  . medroxyPROGESTERone (DEPO-PROVERA) 150 MG/ML injection Inject 1 mL (150 mg total) into the muscle every 3 (three) months. 1 mL 4  . ondansetron (ZOFRAN ODT) 4 MG disintegrating tablet Take 1 tablet (4 mg total) by mouth every 8 (eight) hours as needed for nausea or vomiting. 30 tablet 0  . promethazine (PHENERGAN) 25 MG tablet Take 1 tablet (25 mg total) by mouth every 6 (six) hours as needed for nausea or vomiting. 30 tablet 0  . linaclotide (LINZESS) 145 MCG CAPS capsule Take 1 capsule (145 mcg total) by mouth daily before breakfast. 30 capsule 1  . pantoprazole (PROTONIX) 40 MG tablet Take 1 tablet (40 mg total) by mouth 2 (two) times daily. 30 tablet 3   No current facility-administered medications for this visit.     Allergies as of 06/09/2019  . (No Known Allergies)    Family History  Problem Relation Age of Onset  . Rheum arthritis Mother   . Lupus Mother   . Obesity Mother   . Hypertension Mother   . Kidney disease Mother   . Healthy Sister   . Breast cancer Maternal Grandmother   . Cancer Maternal Grandmother        breast cancer  . Diabetes Maternal Grandmother   . Hypertension Maternal Grandmother   . Hyperlipidemia Maternal Grandmother   . Breast cancer Cousin   . Diabetes Father     Social History   Socioeconomic History  . Marital status: Single    Spouse name: Not on file  . Number of children: Not on file  . Years of education: Not on file  . Highest education level: Not on file  Occupational History  . Not on file  Social Needs  . Financial resource strain: Not on file  . Food insecurity    Worry: Not on file    Inability: Not on file  . Transportation needs    Medical: Not on file    Non-medical: Not on file  Tobacco Use  . Smoking status: Never Smoker  .  Smokeless tobacco: Never Used  Substance and Sexual Activity  . Alcohol use: Not Currently    Frequency: Never  . Drug use: Never  . Sexual activity: Not Currently    Comment: 1st intercourse- 16, partners- 2,   Lifestyle  . Physical activity    Days per week: Not on file    Minutes per session: Not on file  . Stress: Not on file  Relationships  . Social Herbalist on phone: Not on file    Gets together: Not on file    Attends religious service: Not on file    Active member of club or organization: Not on file    Attends meetings of clubs or organizations: Not on file    Relationship status: Not on file  . Intimate partner violence    Fear of current or ex partner: Not on file    Emotionally abused: Not on file    Physically abused: Not  on file    Forced sexual activity: Not on file  Other Topics Concern  . Not on file  Social History Narrative  . Not on file    Review of Systems: 12 system ROS is negative except as noted above with the additions of anxiety, depression, headaches, and insomnia.   Physical Exam: General:   Alert,  well-nourished, pleasant and cooperative in NAD Head:  Normocephalic and atraumatic. Eyes:  Sclera clear, no icterus.   Conjunctiva pink. Ears:  Normal auditory acuity. Nose:  No deformity, discharge,  or lesions. Mouth:  No deformity or lesions.   Neck:  Supple; no masses or thyromegaly. Lungs:  Clear throughout to auscultation.   No wheezes. Heart:  Regular rate and rhythm; no murmurs. Abdomen:  Soft,nontender, nondistended, normal bowel sounds, no rebound or guarding. No hepatosplenomegaly.   Rectal:  Deferred  Msk:  Symmetrical. No boney deformities LAD: No inguinal or umbilical LAD Extremities:  No clubbing or edema. Neurologic:  Alert and  oriented x4;  grossly nonfocal Skin:  Intact without significant lesions or rashes. Psych:  Alert and cooperative. Normal mood and affect.   Anola Mcgough L. Orvan FalconerBeavers, MD, MPH 06/09/2019,  8:35 PM

## 2019-06-09 NOTE — Patient Instructions (Addendum)
We have sent the following medications to your pharmacy for you to pick up at your convenience: Pantoprazole 40 mg daily Linzess 145 mcg daily  Your provider has requested that you go to the basement level for lab work before leaving today. Press "B" on the elevator. The lab is located at the first door on the left as you exit the elevator.  You have been scheduled for an endoscopy. Please follow written instructions given to you at your visit today. If you use inhalers (even only as needed), please bring them with you on the day of your procedure.

## 2019-06-10 ENCOUNTER — Ambulatory Visit (AMBULATORY_SURGERY_CENTER): Payer: 59 | Admitting: Gastroenterology

## 2019-06-10 ENCOUNTER — Other Ambulatory Visit: Payer: Self-pay

## 2019-06-10 ENCOUNTER — Encounter: Payer: Self-pay | Admitting: Gastroenterology

## 2019-06-10 ENCOUNTER — Encounter: Payer: Self-pay | Admitting: *Deleted

## 2019-06-10 VITALS — BP 103/68 | HR 89 | Temp 98.2°F | Resp 22 | Ht 61.0 in | Wt 101.0 lb

## 2019-06-10 DIAGNOSIS — K219 Gastro-esophageal reflux disease without esophagitis: Secondary | ICD-10-CM

## 2019-06-10 DIAGNOSIS — R112 Nausea with vomiting, unspecified: Secondary | ICD-10-CM

## 2019-06-10 MED ORDER — SODIUM CHLORIDE 0.9 % IV SOLN: 500.0000 mL | Freq: Once | INTRAVENOUS | Status: DC

## 2019-06-10 NOTE — Patient Instructions (Signed)
Thank you for allowing [place] to care for you today!  Await biopsy results, approximately 2 weeks.  We will either send a letter or call, depending on results.  Resume previous diet and medications today.  CONTINUE TO TAKE PANTOPRAZOLE 40 mg BY MOUTH EVERY MORNING.  Return to GI clinic in 3-4 weeks.  We will call to make this appointment with you.    YOU HAD AN ENDOSCOPIC PROCEDURE TODAY AT [facility]:   Refer to the procedure report that was given to you for any specific questions about what was found during the examination.  If the procedure report does not answer your questions, please call your gastroenterologist to clarify.  If you requested that your care partner not be given the details of your procedure findings, then the procedure report has been included in a sealed envelope for you to review at your convenience later.  YOU SHOULD EXPECT: Some feelings of bloating in the abdomen. Passage of more gas than usual.  Walking can help get rid of the air that was put into your GI tract during the procedure and reduce the bloating. If you had a lower endoscopy (such as a colonoscopy or flexible sigmoidoscopy) you may notice spotting of blood in your stool or on the toilet paper. If you underwent a bowel prep for your procedure, you may not have a normal bowel movement for a few days.  Please Note:  You might notice some irritation and congestion in your nose or some drainage.  This is from the oxygen used during your procedure.  There is no need for concern and it should clear up in a day or so.  SYMPTOMS TO REPORT IMMEDIATELY:   Following upper endoscopy (EGD)  Vomiting of blood or coffee ground material  New chest pain or pain under the shoulder blades  Painful or persistently difficult swallowing  New shortness of breath  Fever of 100F or higher  Black, tarry-looking stools  For urgent or emergent issues, a gastroenterologist can be reached at any hour by calling  [phone].   DIET:  We do recommend a small meal at first, but then you may proceed to your regular diet.  Drink plenty of fluids but you should avoid alcoholic beverages for 24 hours.  ACTIVITY:  You should plan to take it easy for the rest of today and you should NOT DRIVE or use heavy machinery until tomorrow (because of the sedation medicines used during the test).    FOLLOW UP: Our staff will call the number listed on your records 48-72 hours following your procedure to check on you and address any questions or concerns that you may have regarding the information given to you following your procedure. If we do not reach you, we will leave a message.  We will attempt to reach you two times.  During this call, we will ask if you have developed any symptoms of COVID 19. If you develop any symptoms (ie: fever, flu-like symptoms, shortness of breath, cough etc.) before then, please call [phone].  If you test positive for Covid 19 in the 2 weeks post procedure, please call and report this information to [place].    If any biopsies were taken you will be contacted by phone or by letter within the next 1-3 weeks.  Please call us at [phone] if you have not heard about the biopsies in 3 weeks.    SIGNATURES/CONFIDENTIALITY: You and/or your care partner have signed paperwork which will be  entered into your electronic medical record.  These signatures attest to the fact that that the information above on your After Visit Summary has been reviewed and is understood.  Full responsibility of the confidentiality of this discharge information lies with you and/or your care-partner. 

## 2019-06-10 NOTE — Progress Notes (Signed)
PT taken to PACU. Monitors in place. VSS. Report given to RN. 

## 2019-06-10 NOTE — Progress Notes (Signed)
Called to room to assist during endoscopic procedure.  Patient ID and intended procedure confirmed with present staff. Received instructions for my participation in the procedure from the performing physician.  

## 2019-06-10 NOTE — Op Note (Signed)
[facility] Patient Name: Delaina Fetsch Procedure Date: 06/10/2019 3:09 PM MRN: [id number] Endoscopist: Thornton Park MD, MD Age: 18 Referring MD:  Date of Birth: 07-30-2001 Gender: Female Account #: [id number] Procedure:                Upper GI endoscopy Indications:              Nausea with vomiting                           Midepigastric abdominal pain x 3 weeks                           Hematemesis x 1 Medicines:                See the Anesthesia note for documentation of the                            administered medications Procedure:                Pre-Anesthesia Assessment:                           - Prior to the procedure, a History and Physical                            was performed, and patient medications and                            allergies were reviewed. The patient's tolerance of                            previous anesthesia was also reviewed. The risks                            and benefits of the procedure and the sedation                            options and risks were discussed with the patient.                            All questions were answered, and informed consent                            was obtained. Prior Anticoagulants: The patient has                            taken no previous anticoagulant or antiplatelet                            agents. ASA Grade Assessment: I - A normal, healthy                            patient. After reviewing the risks and benefits,  the patient was deemed in satisfactory condition to                            undergo the procedure.                           After obtaining informed consent, the endoscope was                            passed under direct vision. Throughout the                            procedure, the patient's blood pressure, pulse, and                            oxygen saturations were monitored continuously. The   Endoscope was introduced through the mouth, and                            advanced to the third part of duodenum. The upper                            GI endoscopy was accomplished without difficulty.                            The patient tolerated the procedure well. Scope In: Scope Out: Findings:                 The examined esophagus was normal. Biopsies were                            obtained from the proximal and distal esophagus                            with cold forceps for histology of suspected                            eosinophilic esophagitis.                           The entire examined stomach was normal. Biopsies                            were taken with a cold forceps for histology.                            Estimated blood loss was minimal.                           The examined duodenum was normal. Biopsies were                            taken with a cold forceps for histology. Estimated  blood loss was minimal.                           The cardia and gastric fundus were normal on                            retroflexion.                           The exam was otherwise without abnormality. Complications:            No immediate complications. Estimated blood loss:                            Minimal. Estimated Blood Loss:     Estimated blood loss was minimal. Impression:               - Normal esophagus. Biopsied.                           - Normal stomach. Biopsied.                           - Normal examined duodenum. Biopsied.                           - The examination was otherwise normal. Recommendation:           - Patient has a contact number available for                            emergencies. The signs and symptoms of potential                            delayed complications were discussed with the                            patient. Return to normal activities tomorrow.                            Written discharge  instructions were provided to the                            patient.                           - Resume previous diet.                           - Continue present medications including                            pantoprazole 40 mg every morning.                           - Await pathology results.                           -  Return to GI clinic in 3-4 weeks. Tressia DanasKimberly Laurajean Hosek MD, MD 06/10/2019 3:47:23 PM This report has been signed electronically.

## 2019-06-14 ENCOUNTER — Telehealth: Payer: Self-pay

## 2019-06-14 NOTE — Telephone Encounter (Signed)
  Follow up Call-  Call back number 06/10/2019  Post procedure Call Back phone  # [phone]  Permission to leave phone message Yes     Patient questions:  Do you have a fever, pain , or abdominal swelling? No. Pain Score  0 *  Have you tolerated food without any problems? Yes.    Have you been able to return to your normal activities? Yes.    Do you have any questions about your discharge instructions: Diet   No. Medications  No. Follow up visit  No.  Do you have questions or concerns about your Care? No.  Actions: * If pain score is 4 or above: No action needed, pain <4.  1. Have you developed a fever since your procedure? no  2.   Have you had an respiratory symptoms (SOB or cough) since your procedure? no  3.   Have you tested positive for COVID 19 since your procedure no  4.   Have you had any family members/close contacts diagnosed with the COVID 19 since your procedure?  no   If yes to any of these questions please route to Joylene John, RN and Alphonsa Gin, Therapist, sports.

## 2019-06-14 NOTE — Telephone Encounter (Signed)
No answer, left message to call back later today, B.Jamarrius Salay RN. 

## 2019-06-15 ENCOUNTER — Encounter: Payer: Self-pay | Admitting: Gastroenterology

## 2019-06-16 ENCOUNTER — Encounter: Payer: Self-pay | Admitting: *Deleted

## 2019-07-07 ENCOUNTER — Ambulatory Visit: Payer: 59 | Admitting: Gastroenterology

## 2019-07-08 ENCOUNTER — Encounter: Payer: 59 | Admitting: Gastroenterology

## 2019-07-21 ENCOUNTER — Other Ambulatory Visit: Payer: Self-pay | Admitting: Gastroenterology

## 2019-07-27 ENCOUNTER — Ambulatory Visit: Payer: 59 | Admitting: Gastroenterology

## 2019-07-30 ENCOUNTER — Encounter: Payer: Self-pay | Admitting: Gastroenterology

## 2019-07-30 ENCOUNTER — Ambulatory Visit (INDEPENDENT_AMBULATORY_CARE_PROVIDER_SITE_OTHER): Payer: 59 | Admitting: Gastroenterology

## 2019-07-30 VITALS — BP 96/60 | HR 80 | Temp 97.9°F | Ht 61.0 in | Wt 101.2 lb

## 2019-07-30 DIAGNOSIS — K920 Hematemesis: Secondary | ICD-10-CM | POA: Diagnosis not present

## 2019-07-30 DIAGNOSIS — K5900 Constipation, unspecified: Secondary | ICD-10-CM

## 2019-07-30 DIAGNOSIS — R112 Nausea with vomiting, unspecified: Secondary | ICD-10-CM

## 2019-07-30 NOTE — Progress Notes (Signed)
Referring Provider: Flossie Buffy, NP Primary Care Physician:  Flossie Buffy, NP  Reason for Consultation:  Nausea, vomiting, and abdomina pain   IMPRESSION:  Reflux esophagitis on EGD 06/09/19     - esophageal biopsies negative for eosinophilic esophagitis Nausea and vomiting and midepigastric abdominal pain likely due to reflux esophagitis    - normal abdominal ultrasound 05/27/19    - gastric biopsies negative for H pylori, duodenal biopsies negative for celiac Chronic constipation Gilbert's syndrome - isolated elevated bilirubin with otherwise normal liver enzymes  Etiology of nausea, vomiting and midepigastric pain may be due to reflux esophagitis given her clinical improvement on BID PPI and H2Blocker.  Differential also includes functional dyspepsia and symptomatic biliary dyskinesia. Gastroparesis seems less likely.   HIDA with CCK recommended to excluded symptomatic biliary dyskinesia.   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 No further work-up needed at this time.    PLAN: Continue pantoprazole 40 mg BID and famotidine 20 mg BID (3 months refill today) Add a daily stool bulking agent such as Metamucil HIDA scan with CCK Follow-up after the HIDA scan  Please see the "Patient Instructions" section for addition details about the plan.  HPI: Gail [name] is a 18 y.o. female initially referred by the ED for further evaluation of acute onset of sharp, non-radiating, mid-abdominal pain, with associated nausea, early satiety, bloating and vomiting that had resulted in an unintentional 10 pounds weight loss. The interval history is obtained through the patient, review of her electronic health health record, and her mother who accompanies her to this appointment. She also has a history of anxiety and depression, but using her Lexapro on a PRN basis. She also has a history of chronic constipation with a bowel movement once or twice weekly.    Prior work-up in the ED included a RUQ ultrasound, normal liver enzymes except for a bilirubin, and normal lipase.  Initially seen in consultation  06/09/19. She had a normal EGD 06/10/19. Esophageal biopsies showed reflux without eosinophilic esophagitis. Gastric and duodenal biopsies were normal. There was no H pylori.   Labs to follow-up on the hyperbilirubinemia on 06/09/19: TB 1.5, DB 0.3, AST 12, ALT 9, alk phos 47  Returns in follow-up taking famotidine and pantoprazole BID. Uses phenergan as needed for nausea.   Symptoms have largely improved. Symptoms return when she eats large meals. Certain food will trigger her symptoms, particularly fast food. Symptoms are currently diffuse, sharp, non-radiating mid-abdominal pain that develops within 30 minutes of eating and and lasts for up to 2 hours.  Did not like Linzess because of the risk for diarrhea interfering with her normal routine.    No new complaints or concerns.  Weight is stable compared to her visit from 06/09/19.   Prior abdominal imaging: Abdominal ultrasound 05/27/19: normal gallbladder and liver  Endoscopy history: EGD 06/10/19: Normal. Esophageal biopsies showed reflux without eosinophilic esophagitis. Gastric and duodenal biopsies were normal. There was no H pylori.   Past Medical History:  Diagnosis Date  . Anxiety   . Depression   . Medical history non-contributory   . Vision abnormalities     Past Surgical History:  Procedure Laterality Date  . WISDOM TOOTH EXTRACTION      Current Outpatient Medications  Medication Sig Dispense Refill  . famotidine (PEPCID) 20 MG tablet Take 1 tablet (20 mg total) by mouth 2 (two) times daily. 30 tablet 0  . medroxyPROGESTERone (DEPO-PROVERA) 150 MG/ML injection Inject 1 mL (150 mg total) into  the muscle every 3 (three) months. 1 mL 4  . ondansetron (ZOFRAN ODT) 4 MG disintegrating tablet Take 1 tablet (4 mg total) by mouth every 8 (eight) hours as needed for nausea or  vomiting. 30 tablet 0  . pantoprazole (PROTONIX) 40 MG tablet Take 1 tablet (40 mg total) by mouth 2 (two) times daily. 180 tablet 3  . promethazine (PHENERGAN) 25 MG tablet Take 1 tablet (25 mg total) by mouth every 6 (six) hours as needed for nausea or vomiting. 30 tablet 0   No current facility-administered medications for this visit.    Allergies as of 07/30/2019  . (No Known Allergies)    Family History  Problem Relation Age of Onset  . Rheum arthritis Mother   . Lupus Mother   . Obesity Mother   . Hypertension Mother   . Kidney disease Mother   . Healthy Sister   . Breast cancer Maternal Grandmother   . Cancer Maternal Grandmother        breast cancer  . Diabetes Maternal Grandmother   . Hypertension Maternal Grandmother   . Hyperlipidemia Maternal Grandmother   . Breast cancer Cousin   . Diabetes Father     Social History   Socioeconomic History  . Marital status: Single    Spouse name: Not on file  . Number of children: Not on file  . Years of education: Not on file  . Highest education level: Not on file  Occupational History  . Not on file  Tobacco Use  . Smoking status: Never Smoker  . Smokeless tobacco: Never Used  Substance and Sexual Activity  . Alcohol use: Not Currently  . Drug use: Never  . Sexual activity: Not Currently    Comment: 1st intercourse- 16, partners- 2,   Other Topics Concern  . Not on file  Social History Narrative  . Not on file   Social Determinants of Health   Financial Resource Strain:   . Difficulty of Paying Living Expenses: Not on file  Food Insecurity:   . Worried About Charity fundraiser in the Last Year: Not on file  . Ran Out of Food in the Last Year: Not on file  Transportation Needs:   . Lack of Transportation (Medical): Not on file  . Lack of Transportation (Non-Medical): Not on file  Physical Activity:   . Days of Exercise per Week: Not on file  . Minutes of Exercise per Session: Not on file  Stress:   .  Feeling of Stress : Not on file  Social Connections:   . Frequency of Communication with Friends and Family: Not on file  . Frequency of Social Gatherings with Friends and Family: Not on file  . Attends Religious Services: Not on file  . Active Member of Clubs or Organizations: Not on file  . Attends Archivist Meetings: Not on file  . Marital Status: Not on file  Intimate Partner Violence:   . Fear of Current or Ex-Partner: Not on file  . Emotionally Abused: Not on file  . Physically Abused: Not on file  . Sexually Abused: Not on file     Physical Exam: General:   Alert,  well-nourished, pleasant and cooperative in NAD. Appears her stated age.  Head:  Normocephalic and atraumatic. Eyes:  Sclera clear, no icterus.   Conjunctiva pink. Ears:  Normal auditory acuity. Nose:  No deformity, discharge,  or lesions. Mouth:  No deformity or lesions.   Neck:  Supple; no masses  or thyromegaly. Abdomen:  Soft, thin, nontender, nondistended, normal bowel sounds, no rebound or guarding. I am unable to reproduce her pain or symptoms.  No hepatosplenomegaly.   LAD: No inguinal or umbilical LAD Extremities:  No clubbing or edema. Neurologic:  Alert and  oriented x4;  grossly nonfocal Skin:  No obvious rash or bruise Psych:  Alert and cooperative. Normal mood and affect.   Gaylen Venning L. Tarri Glenn, MD, MPH 07/30/2019, 9:06 AM

## 2019-07-30 NOTE — Patient Instructions (Signed)
Continue Pantoprazole 40 mg twice a day and famotidine 20 mg twice a day.    Metamucil, 1 tablespoon once or twice daily can be used to keep bowels regular if needed.  You have been scheduled for a HIDA scan at [facility] (1st floor) on 08-09-2019. Please arrive 15 minutes prior to your scheduled appointment at  6:57 am. Make certain not to have anything to eat or drink at least 6 hours prior to your test. Should this appointment date or time not work well for you, please call radiology scheduling at [phone].  _____________________________________________________________________ hepatobiliary (HIDA) scan is an imaging procedure used to diagnose problems in the liver, gallbladder and bile ducts. In the HIDA scan, a radioactive chemical or tracer is injected into a vein in your arm. The tracer is handled by the liver like bile. Bile is a fluid produced and excreted by your liver that helps your digestive system break down fats in the foods you eat. Bile is stored in your gallbladder and the gallbladder releases the bile when you eat a meal. A special nuclear medicine scanner (gamma camera) tracks the flow of the tracer from your liver into your gallbladder and small intestine.  During your HIDA scan  You'll be asked to change into a hospital gown before your HIDA scan begins. Your health care team will position you on a table, usually on your back. The radioactive tracer is then injected into a vein in your arm.The tracer travels through your bloodstream to your liver, where it's taken up by the bile-producing cells. The radioactive tracer travels with the bile from your liver into your gallbladder and through your bile ducts to your small intestine.You may feel some pressure while the radioactive tracer is injected into your vein. As you lie on the table, a special gamma camera is positioned over your abdomen taking pictures of the tracer as it moves through your body. The gamma camera takes  pictures continually for about an hour. You'll need to keep still during the HIDA scan. This can become uncomfortable, but you may find that you can lessen the discomfort by taking deep breaths and thinking about other things. Tell your health care team if you're uncomfortable. The radiologist will watch on a computer the progress of the radioactive tracer through your body. The HIDA scan may be stopped when the radioactive tracer is seen in the gallbladder and enters your small intestine. This typically takes about an hour. In some cases extra imaging will be performed if original images aren't satisfactory, if morphine is given to help visualize the gallbladder or if the medication CCK is given to look at the contraction of the gallbladder. This test typically takes 2 hours to complete. ________________________________________________________________________

## 2019-08-10 ENCOUNTER — Other Ambulatory Visit: Payer: Self-pay

## 2019-08-10 ENCOUNTER — Encounter (HOSPITAL_COMMUNITY)
Admission: RE | Admit: 2019-08-10 | Discharge: 2019-08-10 | Disposition: A | Discharge status: Home / Self Care | Payer: Managed Care, Other (non HMO) | Source: Ambulatory Visit | Attending: Gastroenterology | Admitting: Gastroenterology

## 2019-08-10 DIAGNOSIS — R112 Nausea with vomiting, unspecified: Secondary | ICD-10-CM | POA: Insufficient documentation

## 2019-08-10 MED ORDER — TECHNETIUM TC 99M MEBROFENIN IV KIT
5.4000 | PACK | Freq: Once | INTRAVENOUS | Status: AC
  Administered 2019-08-10: 5.4 via INTRAVENOUS

## 2019-08-31 ENCOUNTER — Other Ambulatory Visit: Payer: Self-pay

## 2019-09-01 ENCOUNTER — Ambulatory Visit (INDEPENDENT_AMBULATORY_CARE_PROVIDER_SITE_OTHER): Payer: No Typology Code available for payment source

## 2019-09-01 DIAGNOSIS — Z3042 Encounter for surveillance of injectable contraceptive: Secondary | ICD-10-CM

## 2019-09-01 MED ORDER — MEDROXYPROGESTERONE ACETATE 150 MG/ML IM SUSP: 150.0000 mg | Freq: Once | INTRAMUSCULAR | Status: DC

## 2019-09-01 MED ORDER — MEDROXYPROGESTERONE ACETATE 150 MG/ML IM SUSY: PREFILLED_SYRINGE | Freq: Once | INTRAMUSCULAR | Status: AC

## 2019-09-01 NOTE — Progress Notes (Signed)
Per orders of Charlotte Nche  Injection Depo provera of  given by Novalyn Lajara L Jamare Vanatta in Left Glute. Patient tolerated injection well. Patient will make appointment for 3 months, between 11/17/19-12/01/19.

## 2019-09-01 NOTE — Progress Notes (Signed)
Medical screening examination/treatment/procedure(s) were performed by the CMA. As primary care provider I was immediately available for consulation/collaboration. I agree with above documentation. Riggin Cuttino, AGNP-C 

## 2019-09-01 NOTE — Patient Instructions (Signed)
Health Maintenance Due  Topic Date Due  . TETANUS/TDAP  07/05/2019    Depression screen [facility] 2/9 06/07/2019 06/07/2019 09/23/2018  Decreased Interest 2 3 1   Down, Depressed, Hopeless 1 3 1   PHQ - 2 Score 3 6 2   Altered sleeping 3 - 3  Tired, decreased energy 3 - 1  Change in appetite 3 - 0  Feeling bad or failure about yourself  1 - 1  Trouble concentrating 3 - 0  Moving slowly or fidgety/restless 0 - 0  Suicidal thoughts 0 - 0  PHQ-9 Score 16 - 7

## 2019-11-22 ENCOUNTER — Other Ambulatory Visit: Payer: Self-pay

## 2019-11-23 MED ORDER — MEDROXYPROGESTERONE ACETATE 150 MG/ML IM SUSP: 150.0000 mg | Freq: Once | INTRAMUSCULAR | Status: DC

## 2019-11-23 NOTE — Progress Notes (Signed)
This encounter was created in error - please disregard.

## 2019-11-26 ENCOUNTER — Other Ambulatory Visit: Payer: Self-pay

## 2019-11-26 ENCOUNTER — Ambulatory Visit (INDEPENDENT_AMBULATORY_CARE_PROVIDER_SITE_OTHER): Payer: 59 | Admitting: Obstetrics & Gynecology

## 2019-11-26 ENCOUNTER — Encounter: Payer: Self-pay | Admitting: Obstetrics & Gynecology

## 2019-11-26 VITALS — BP 116/72 | Ht 61.0 in | Wt 109.0 lb

## 2019-11-26 DIAGNOSIS — Z113 Encounter for screening for infections with a predominantly sexual mode of transmission: Secondary | ICD-10-CM

## 2019-11-26 DIAGNOSIS — Z3042 Encounter for surveillance of injectable contraceptive: Secondary | ICD-10-CM | POA: Diagnosis not present

## 2019-11-26 DIAGNOSIS — Z01419 Encounter for gynecological examination (general) (routine) without abnormal findings: Secondary | ICD-10-CM

## 2019-11-26 NOTE — Patient Instructions (Signed)
1. Well female exam with routine gynecological exam Normal gynecologic exam.  Will start Pap tests at age 19.  Breasts normal.  BMI 20.6.  Walks her dog.  Healthy nutrition.  2. Surveillance for Depo-Provera contraception Well on Depo-Provera IM q3 mths x 09/2018.  Prescribed and administered by Fam MD.  3. Screen for STD (sexually transmitted disease) Strict condom use strongly recommended.   - Gono-Chlam done - HIV antibody (with reflex) - RPR - Hepatitis C Antibody - Hepatitis B Surface AntiGEN  Gail [name], it was a pleasure seeing you today!  I will inform you of your results as soon as they are available.

## 2019-11-26 NOTE — Progress Notes (Signed)
    Gail [name] [birth year] [id number]   History:    19 y.o. G0 Single.  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  RP:  Established patient presenting for annual gyn exam   HPI:  Well on DepoProvera started 09/2018.  No BTB.  No pelvic pain.  Had IC with condoms in the past, not currently sexually active.  STI screen Negative 09/2018.  Breasts normal. BMI 20.6.  Physically active.   Past medical history,surgical history, family history and social history were all reviewed and documented in the EPIC chart.  Gynecologic History No LMP recorded. Patient has had an injection.  Obstetric History OB History  Gravida Para Term Preterm AB Living  0 0 0 0 0 0  SAB TAB Ectopic Multiple Live Births  0 0 0 0 0     ROS: A ROS was performed and pertinent positives and negatives are included in the history.  GENERAL: No fevers or chills. HEENT: No change in vision, no earache, sore throat or sinus congestion. NECK: No pain or stiffness. CARDIOVASCULAR: No chest pain or pressure. No palpitations. PULMONARY: No shortness of breath, cough or wheeze. GASTROINTESTINAL: No abdominal pain, nausea, vomiting or diarrhea, melena or bright red blood per rectum. GENITOURINARY: No urinary frequency, urgency, hesitancy or dysuria. MUSCULOSKELETAL: No joint or muscle pain, no back pain, no recent trauma. DERMATOLOGIC: No rash, no itching, no lesions. ENDOCRINE: No polyuria, polydipsia, no heat or cold intolerance. No recent change in weight. HEMATOLOGICAL: No anemia or easy bruising or bleeding. NEUROLOGIC: No headache, seizures, numbness, tingling or weakness. PSYCHIATRIC: No depression, no loss of interest in normal activity or change in sleep pattern.     Exam:   BP 116/72   Ht 5\' 1"  (1.549 m)   Wt 109 lb (49.4 kg)   BMI 20.60 kg/m   Body mass index is 20.6 kg/m.  General appearance : Well developed well nourished female. No acute distress HEENT: Eyes: no retinal hemorrhage or exudates,  Neck supple,  trachea midline, no carotid bruits, no thyroidmegaly Lungs: Clear to auscultation, no rhonchi or wheezes, or rib retractions  Heart: Regular rate and rhythm, no murmurs or gallops Breast:Examined in sitting and supine position were symmetrical in appearance, no palpable masses or tenderness,  no skin retraction, no nipple inversion, no nipple discharge, no skin discoloration, no axillary or supraclavicular lymphadenopathy Abdomen: no palpable masses or tenderness, no rebound or guarding Extremities: no edema or skin discoloration or tenderness  Pelvic: Vulva: Normal             Vagina: No gross lesions or discharge  Cervix: No gross lesions or discharge.  Gono-Chlam done.  Uterus  AV, normal size, shape and consistency, non-tender and mobile  Adnexa  Without masses or tenderness  Anus: Normal   Assessment/Plan:  19 y.o. female for annual exam   1. Well female exam with routine gynecological exam Normal gynecologic exam.  Will start Pap tests at age 68.  Breasts normal.  BMI 20.6.  Walks her dog.  Healthy nutrition.  2. Surveillance for Depo-Provera contraception Well on Depo-Provera IM q3 mths x 09/2018.  Prescribed and administered by Fam MD.  3. Screen for STD (sexually transmitted disease) Strict condom use strongly recommended.   - Gono-Chlam done - HIV antibody (with reflex) - RPR - Hepatitis C Antibody - Hepatitis B Surface AntiGEN  10/2018 MD, 10:30 AM 11/26/2019

## 2019-11-26 NOTE — Addendum Note (Signed)
Addended by: Dayna Barker on: 11/26/2019 10:58 AM   Modules accepted: Orders

## 2019-11-27 LAB — C. TRACHOMATIS/N. GONORRHOEAE RNA
C. trachomatis RNA, TMA: NOT DETECTED
N. gonorrhoeae RNA, TMA: NOT DETECTED

## 2019-11-29 LAB — HEPATITIS C ANTIBODY
Hepatitis C Ab: NONREACTIVE
SIGNAL TO CUT-OFF: 0.01 (ref <1.00)

## 2019-11-29 LAB — HEPATITIS B SURFACE ANTIGEN: Hepatitis B Surface Ag: NONREACTIVE

## 2019-11-29 LAB — RPR: RPR Ser Ql: NONREACTIVE

## 2019-11-29 LAB — HIV ANTIBODY (ROUTINE TESTING W REFLEX): HIV 1&2 Ab, 4th Generation: NONREACTIVE

## 2019-12-01 ENCOUNTER — Ambulatory Visit: Payer: Self-pay

## 2019-12-02 ENCOUNTER — Other Ambulatory Visit: Payer: Self-pay

## 2019-12-02 ENCOUNTER — Ambulatory Visit (INDEPENDENT_AMBULATORY_CARE_PROVIDER_SITE_OTHER): Payer: 59

## 2019-12-02 DIAGNOSIS — Z30013 Encounter for initial prescription of injectable contraceptive: Secondary | ICD-10-CM | POA: Diagnosis not present

## 2019-12-02 LAB — POCT URINE PREGNANCY: Preg Test, Ur: NEGATIVE

## 2019-12-02 MED ORDER — MEDROXYPROGESTERONE ACETATE 150 MG/ML IM SUSY
PREFILLED_SYRINGE | Freq: Once | INTRAMUSCULAR | Status: AC
  Administered 2019-12-02: 150 mg via INTRAMUSCULAR

## 2019-12-02 NOTE — Progress Notes (Signed)
After obtaining consent, and per orders of [facility], injection of Depo given left glute by [place]. Patient instructed to remain in clinic for 20 minutes afterwards, and to report any adverse reaction to me immediately.

## 2019-12-03 ENCOUNTER — Encounter: Payer: 59 | Admitting: Nurse Practitioner

## 2019-12-03 ENCOUNTER — Other Ambulatory Visit: Payer: Self-pay

## 2019-12-03 ENCOUNTER — Encounter: Payer: Self-pay | Admitting: Nurse Practitioner

## 2019-12-03 NOTE — Progress Notes (Signed)
Medical screening examination/treatment/procedure(s) were performed by the CMA. As primary care provider I was immediately available for consulation/collaboration. I agree with above documentation. Devanny Palecek, AGNP-C 

## 2019-12-05 NOTE — Progress Notes (Signed)
This encounter was created in error - please disregard.

## 2019-12-07 ENCOUNTER — Ambulatory Visit: Payer: 59 | Admitting: Nurse Practitioner

## 2019-12-22 ENCOUNTER — Encounter: Payer: Self-pay | Admitting: Nurse Practitioner

## 2019-12-22 ENCOUNTER — Other Ambulatory Visit: Payer: Self-pay

## 2019-12-22 ENCOUNTER — Ambulatory Visit (INDEPENDENT_AMBULATORY_CARE_PROVIDER_SITE_OTHER): Payer: 59 | Admitting: Nurse Practitioner

## 2019-12-22 VITALS — BP 112/62 | HR 91 | Temp 97.2°F | Ht 61.0 in | Wt 110.0 lb

## 2019-12-22 DIAGNOSIS — R519 Headache, unspecified: Secondary | ICD-10-CM | POA: Diagnosis not present

## 2019-12-22 NOTE — Patient Instructions (Addendum)
Please contact GI to schedule f/up appt.  Work on time management to allow for adequate sleep. Use blue light glass during computer time. Ok to use tylenol 500mg  1tab every 8hrs as needed.  Decrease shaving frequency to avoid skin irritation Use miconazole power twice a day in underarms x 1week. Change blade after each use.  General Headache Without Cause A headache is pain or discomfort that is felt around the head or neck area. There are many causes and types of headaches. In some cases, the cause may not be found. Follow these instructions at home: Watch your condition for any changes. Let your doctor know about them. Take these steps to help with your condition: Managing pain      Take over-the-counter and prescription medicines only as told by your doctor.  Lie down in a dark, quiet room when you have a headache.  If told, put ice on your head and neck area: ? Put ice in a plastic bag. ? Place a towel between your skin and the bag. ? Leave the ice on for 20 minutes, 2-3 times per day.  If told, put heat on the affected area. Use the heat source that your doctor recommends, such as a moist heat pack or a heating pad. ? Place a towel between your skin and the heat source. ? Leave the heat on for 20-30 minutes. ? Remove the heat if your skin turns bright red. This is very important if you are unable to feel pain, heat, or cold. You may have a greater risk of getting burned.  Keep lights dim if bright lights bother you or make your headaches worse. Eating and drinking  Eat meals on a regular schedule.  If you drink alcohol: ? Limit how much you use to:  0-1 drink a day for women.  0-2 drinks a day for men. ? Be aware of how much alcohol is in your drink. In the [place] one drink equals one 12 oz bottle of beer (355 mL), one 5 oz glass of wine (148 mL), or one 1 oz glass of hard liquor (44 mL).  Stop drinking caffeine, or reduce how much caffeine you drink. General  instructions   Keep a journal to find out if certain things bring on headaches. For example, write down: ? What you eat and drink. ? How much sleep you get. ? Any change to your diet or medicines.  Get a massage or try other ways to relax.  Limit stress.  Sit up straight. Do not tighten (tense) your muscles.  Do not use any products that contain nicotine or tobacco. This includes cigarettes, e-cigarettes, and chewing tobacco. If you need help quitting, ask your doctor.  Exercise regularly as told by your doctor.  Get enough sleep. This often means 7-9 hours of sleep each night.  Keep all follow-up visits as told by your doctor. This is important. Contact a doctor if:  Your symptoms are not helped by medicine.  You have a headache that feels different than the other headaches.  You feel sick to your stomach (nauseous) or you throw up (vomit).  You have a fever. Get help right away if:  Your headache gets very bad quickly.  Your headache gets worse after a lot of physical activity.  You keep throwing up.  You have a stiff neck.  You have trouble seeing.  You have trouble speaking.  You have pain in the eye or ear.  Your muscles are weak or you lose  muscle control.  You lose your balance or have trouble walking.  You feel like you will pass out (faint) or you pass out.  You are mixed up (confused).  You have a seizure. Summary  A headache is pain or discomfort that is felt around the head or neck area.  There are many causes and types of headaches. In some cases, the cause may not be found.  Keep a journal to help find out what causes your headaches. Watch your condition for any changes. Let your doctor know about them.  Contact a doctor if you have a headache that is different from usual, or if your headache is not helped by medicine.  Get help right away if your headache gets very bad, you throw up, you have trouble seeing, you lose your balance, or you  have a seizure. This information is not intended to replace advice given to you by your health care provider. Make sure you discuss any questions you have with your health care provider. Document Revised: 02/23/2018 Document Reviewed: 02/23/2018 Elsevier Patient Education  [place].

## 2019-12-22 NOTE — Progress Notes (Signed)
Subjective:  Patient ID: Gail [name], female    DOB: 26-Feb-2001  Age: 19 y.o. MRN: [id number]  CC: Abdominal Pain (Pt not fasting//abdominal pain in the center of stomach more when Gail [name] eats//bothered her since April//pepid helps some) and Migraine (last week been geting more headaches//they seem to be getting worse and makes her somach hurt worse)  Headache  This is a new problem. The current episode started in the past 7 days. The problem occurs constantly. The problem has been unchanged. The pain is located in the frontal region. The pain does not radiate. The pain quality is similar to prior headaches. The quality of the pain is described as dull. Associated symptoms include nausea. Pertinent negatives include no abdominal pain, abnormal behavior, anorexia, back pain, blurred vision, eye pain, eye redness, eye watering, facial sweating, fever, hearing loss, insomnia, loss of balance, muscle aches, phonophobia, photophobia, rhinorrhea, scalp tenderness, seizures, sinus pressure, tingling, tinnitus, visual change, vomiting or weakness. The symptoms are aggravated by emotional stress, bright light and fatigue. Gail [name] has tried nothing for the symptoms. There is no history of cluster headaches, hypertension, immunosuppression, migraine headaches, migraines in the family, obesity, recent head traumas or sinus disease.  triggered by increased demand with school work (final exam material), which has led to late bedtime. And prolonged time on her labtop.  Reviewed past Medical, Social and Family history today.  Outpatient Medications Prior to Visit  Medication Sig Dispense Refill  . famotidine (PEPCID) 20 MG tablet Take 1 tablet (20 mg total) by mouth 2 (two) times daily. 30 tablet 0  . medroxyPROGESTERone (DEPO-PROVERA) 150 MG/ML injection Inject 1 mL (150 mg total) into the muscle every 3 (three) months. 1 mL 4  . ondansetron (ZOFRAN ODT) 4 MG disintegrating tablet Take 1 tablet (4 mg  total) by mouth every 8 (eight) hours as needed for nausea or vomiting. 30 tablet 0  . pantoprazole (PROTONIX) 40 MG tablet Take 1 tablet (40 mg total) by mouth 2 (two) times daily. 180 tablet 3  . promethazine (PHENERGAN) 25 MG tablet Take 1 tablet (25 mg total) by mouth every 6 (six) hours as needed for nausea or vomiting. 30 tablet 0   No facility-administered medications prior to visit.    ROS See HPI  Objective:  BP 112/62   Pulse 91   Temp (!) 97.2 F (36.2 C) (Tympanic)   Ht 5\' 1"  (1.549 m)   Wt 110 lb (49.9 kg)   SpO2 97%   BMI 20.78 kg/m   BP Readings from Last 3 Encounters:  12/22/19 112/62  11/26/19 116/72  07/30/19 96/60    Wt Readings from Last 3 Encounters:  12/22/19 110 lb (49.9 kg) (16 %, Z= -1.01)*  12/03/19 110 lb (49.9 kg) (16 %, Z= -1.00)*  11/26/19 109 lb (49.4 kg) (14 %, Z= -1.07)*   * Growth percentiles are based on CDC (Girls, 2-20 Years) data.    Physical Exam Vitals reviewed.  HENT:     Right Ear: Tympanic membrane, ear canal and external ear normal.     Left Ear: Tympanic membrane, ear canal and external ear normal.  Eyes:     Extraocular Movements: Extraocular movements intact.     Conjunctiva/sclera: Conjunctivae normal.  Cardiovascular:     Rate and Rhythm: Normal rate and regular rhythm.     Pulses: Normal pulses.     Heart sounds: Normal heart sounds.  Pulmonary:     Effort: Pulmonary effort is normal.     Breath  sounds: Normal breath sounds.  Abdominal:     General: There is no distension.     Palpations: Abdomen is soft.     Tenderness: There is abdominal tenderness.  Musculoskeletal:     Cervical back: Normal range of motion and neck supple.  Skin:    General: Skin is warm and dry.  Neurological:     Mental Status: Gail [name] is alert and oriented to person, place, and time.  Psychiatric:        Mood and Affect: Mood normal.        Behavior: Behavior normal.        Thought Content: Thought content normal.    Lab Results    Component Value Date   WBC 5.9 06/01/2019   HGB 13.2 06/01/2019   HCT 39.9 06/01/2019   PLT 300 06/01/2019   GLUCOSE 89 06/01/2019   CHOL 125 04/28/2018   TRIG 58 04/28/2018   HDL 51 04/28/2018   LDLCALC 62 04/28/2018   ALT 9 06/09/2019   AST 12 06/09/2019   NA 136 06/01/2019   K 3.8 06/01/2019   CL 103 06/01/2019   CREATININE 0.97 06/01/2019   BUN 9 06/01/2019   CO2 23 06/01/2019   TSH 1.611 04/28/2018   HGBA1C 4.8 04/28/2018    Assessment & Plan:  This visit occurred during the SARS-CoV-2 public health emergency.  Safety protocols were in place, including screening questions prior to the visit, additional usage of staff PPE, and extensive cleaning of exam room while observing appropriate contact time as indicated for disinfecting solutions.   Gail [name] was seen today for abdominal pain and migraine.  Diagnoses and all orders for this visit:  Nonintractable episodic headache, unspecified headache type   I am having Gail [name]. [name] maintain her medroxyPROGESTERone, promethazine, famotidine, ondansetron, and pantoprazole.  No orders of the defined types were placed in this encounter.   Problem List Items Addressed This Visit    None    Visit Diagnoses    Nonintractable episodic headache, unspecified headache type    -  Primary       Follow-up: Return if symptoms worsen or fail to improve.  Alysia Penna, NP

## 2020-02-22 ENCOUNTER — Ambulatory Visit (INDEPENDENT_AMBULATORY_CARE_PROVIDER_SITE_OTHER): Payer: No Typology Code available for payment source | Admitting: Behavioral Health

## 2020-02-22 ENCOUNTER — Other Ambulatory Visit: Payer: Self-pay

## 2020-02-22 DIAGNOSIS — Z3042 Encounter for surveillance of injectable contraceptive: Secondary | ICD-10-CM | POA: Diagnosis not present

## 2020-02-22 MED ORDER — MEDROXYPROGESTERONE ACETATE 150 MG/ML IM SUSP: 150.0000 mg | Freq: Once | INTRAMUSCULAR | Status: DC

## 2020-02-22 MED ORDER — MEDROXYPROGESTERONE ACETATE 150 MG/ML IM SUSY: PREFILLED_SYRINGE | Freq: Once | INTRAMUSCULAR | Status: AC

## 2020-02-22 NOTE — Progress Notes (Signed)
Patient presents in clinic today for Depo-Provera injection. IM injection was given in the RT Ventrogluteal. Patient tolerated it well. No signs or symptoms of a reaction were noted prior to patient leaving the nurse visit. Next appointment has been scheduled for 05/10/20 at 8:20 AM.

## 2020-02-27 NOTE — Progress Notes (Signed)
Medical screening examination/treatment/procedure(s) were performed by the RN. As primary care provider I was immediately available for consulation/collaboration. I agree with above documentation. Shelbey Spindler, AGNP-C 

## 2020-04-02 ENCOUNTER — Ambulatory Visit (INDEPENDENT_AMBULATORY_CARE_PROVIDER_SITE_OTHER): Payer: No Typology Code available for payment source

## 2020-04-02 ENCOUNTER — Encounter: Payer: Self-pay | Admitting: Emergency Medicine

## 2020-04-02 ENCOUNTER — Ambulatory Visit
Admission: EM | Admit: 2020-04-02 | Discharge: 2020-04-02 | Disposition: A | Discharge status: Home / Self Care | Payer: No Typology Code available for payment source | Attending: Emergency Medicine | Admitting: Emergency Medicine

## 2020-04-02 ENCOUNTER — Other Ambulatory Visit: Payer: Self-pay

## 2020-04-02 DIAGNOSIS — J069 Acute upper respiratory infection, unspecified: Secondary | ICD-10-CM

## 2020-04-02 DIAGNOSIS — R0981 Nasal congestion: Secondary | ICD-10-CM | POA: Diagnosis not present

## 2020-04-02 DIAGNOSIS — Z1152 Encounter for screening for COVID-19: Secondary | ICD-10-CM | POA: Diagnosis not present

## 2020-04-02 DIAGNOSIS — R05 Cough: Secondary | ICD-10-CM | POA: Diagnosis not present

## 2020-04-02 MED ORDER — CETIRIZINE HCL 10 MG PO TABS: 10.0000 mg | ORAL_TABLET | Freq: Every day | ORAL | 0 refills | Status: DC

## 2020-04-02 MED ORDER — BENZONATATE 100 MG PO CAPS: 100.0000 mg | ORAL_CAPSULE | Freq: Three times a day (TID) | ORAL | 0 refills | Status: DC

## 2020-04-02 MED ORDER — FLUTICASONE PROPIONATE 50 MCG/ACT NA SUSP
1.0000 | Freq: Every day | NASAL | 0 refills | Status: DC
Start: 2020-04-02 — End: 2020-11-29

## 2020-04-02 NOTE — ED Triage Notes (Signed)
Pt here for cough and congestion x 3 weeks; denies fever but sts productive cough

## 2020-04-02 NOTE — Discharge Instructions (Signed)

## 2020-04-02 NOTE — ED Provider Notes (Signed)
[facility]    CSN: [id number] Arrival date & time: 04/02/20  0915      History   Chief Complaint Chief Complaint  Patient presents with  . Cough  . Nasal Congestion    HPI Gail [name] is a 19 y.o. female  No history of anxiety, depression presenting for 3-week course of mildly productive cough and nasal congestion.  Denies fever, hemoptysis, difficulty breathing or chest pain.  No known sick contacts.  Requesting Covid testing as she is supposed to return to school in a few days.  Past Medical History:  Diagnosis Date  . Anxiety   . Depression   . Medical history non-contributory   . Vision abnormalities     Patient Active Problem List   Diagnosis Date Noted  . Initiation of Depo Provera 09/24/2018  . MDD (major depressive disorder), single episode, severe with psychosis (HCC) 05/03/2018  . Severe major depression with psychotic features, mood-congruent (HCC) 04/28/2018  . Suicide ideation 04/28/2018  . GAD (generalized anxiety disorder) 04/28/2018    Past Surgical History:  Procedure Laterality Date  . WISDOM TOOTH EXTRACTION      OB History    Gravida  0   Para  0   Term  0   Preterm  0   AB  0   Living  0     SAB  0   TAB  0   Ectopic  0   Multiple  0   Live Births  0            Home Medications    Prior to Admission medications   Medication Sig Start Date End Date Taking? Authorizing Provider  benzonatate (TESSALON) 100 MG capsule Take 1 capsule (100 mg total) by mouth every 8 (eight) hours. 04/02/20   Hall-Potvin, [place], PA-C  cetirizine (ZYRTEC ALLERGY) 10 MG tablet Take 1 tablet (10 mg total) by mouth daily. 04/02/20   Hall-Potvin, [place], PA-C  famotidine (PEPCID) 20 MG tablet Take 1 tablet (20 mg total) by mouth 2 (two) times daily. 06/07/19   Nche, Bonna Gains, NP  fluticasone (FLONASE) 50 MCG/ACT nasal spray Place 1 spray into both nostrils daily. 04/02/20   Hall-Potvin, [place], PA-C    medroxyPROGESTERone (DEPO-PROVERA) 150 MG/ML injection Inject 1 mL (150 mg total) into the muscle every 3 (three) months. 11/19/18   Genia Del, MD  ondansetron (ZOFRAN ODT) 4 MG disintegrating tablet Take 1 tablet (4 mg total) by mouth every 8 (eight) hours as needed for nausea or vomiting. 06/07/19   Nche, Bonna Gains, NP  pantoprazole (PROTONIX) 40 MG tablet Take 1 tablet (40 mg total) by mouth 2 (two) times daily. 07/21/19   Tressia Danas, MD  promethazine (PHENERGAN) 25 MG tablet Take 1 tablet (25 mg total) by mouth every 6 (six) hours as needed for nausea or vomiting. 05/27/19   Jacalyn Lefevre, MD    Family History Family History  Problem Relation Age of Onset  . Rheum arthritis Mother   . Lupus Mother   . Obesity Mother   . Hypertension Mother   . Kidney disease Mother   . Healthy Sister   . Breast cancer Maternal Grandmother   . Cancer Maternal Grandmother        breast cancer  . Diabetes Maternal Grandmother   . Hypertension Maternal Grandmother   . Hyperlipidemia Maternal Grandmother   . Breast cancer Cousin   . Diabetes Father     Social History Social History   Tobacco  Use  . Smoking status: Never Smoker  . Smokeless tobacco: Never Used  Vaping Use  . Vaping Use: Never used  Substance Use Topics  . Alcohol use: Yes    Comment: Rare  . Drug use: Never     Allergies   Patient has no known allergies.   Review of Systems As per HPI   Physical Exam Triage Vital Signs ED Triage Vitals  Enc Vitals Group     BP 04/02/20 0924 129/90     Pulse Rate 04/02/20 0924 76     Resp 04/02/20 0924 18     Temp 04/02/20 0924 98.3 F (36.8 C)     Temp Source 04/02/20 0924 Oral     SpO2 04/02/20 0924 97 %     Weight --      Height --      Head Circumference --      Peak Flow --      Pain Score 04/02/20 0925 5     Pain Loc --      Pain Edu? --      Excl. in GC? --    No data found.  Updated Vital Signs BP 129/90 (BP Location: Left Arm)   Pulse  76   Temp 98.3 F (36.8 C) (Oral)   Resp 18   SpO2 97%   Visual Acuity Right Eye Distance:   Left Eye Distance:   Bilateral Distance:    Right Eye Near:   Left Eye Near:    Bilateral Near:     Physical Exam Constitutional:      General: She is not in acute distress.    Appearance: She is not ill-appearing or diaphoretic.  HENT:     Head: Normocephalic and atraumatic.     Mouth/Throat:     Mouth: Mucous membranes are moist.     Pharynx: Oropharynx is clear. No oropharyngeal exudate or posterior oropharyngeal erythema.  Eyes:     General: No scleral icterus.    Conjunctiva/sclera: Conjunctivae normal.     Pupils: Pupils are equal, round, and reactive to light.  Neck:     Comments: Trachea midline, negative JVD Cardiovascular:     Rate and Rhythm: Normal rate and regular rhythm.     Heart sounds: No murmur heard.  No gallop.   Pulmonary:     Effort: Pulmonary effort is normal. No respiratory distress.     Breath sounds: No wheezing, rhonchi or rales.     Comments: Decreased BS blt Musculoskeletal:     Cervical back: Neck supple. No tenderness.  Lymphadenopathy:     Cervical: No cervical adenopathy.  Skin:    Capillary Refill: Capillary refill takes less than 2 seconds.     Coloration: Skin is not jaundiced or pale.     Findings: No rash.  Neurological:     General: No focal deficit present.     Mental Status: She is alert and oriented to person, place, and time.      UC Treatments / Results  Labs (all labs ordered are listed, but only abnormal results are displayed) Labs Reviewed  NOVEL CORONAVIRUS, NAA    EKG   Radiology DG Chest 2 View  Result Date: 04/02/2020 CLINICAL DATA:  Productive cough and nasal congestion for 3 weeks. EXAM: CHEST - 2 VIEW COMPARISON:  None. FINDINGS: The heart size and mediastinal contours are within normal limits. Both lungs are clear. The visualized skeletal structures are unremarkable. IMPRESSION: No active cardiopulmonary  disease. Electronically Signed  By: Annia Belt M.D.   On: 04/02/2020 10:03    Procedures Procedures (including critical care time)  Medications Ordered in UC Medications - No data to display  Initial Impression / Assessment and Plan / UC Course  I have reviewed the triage vital signs and the nursing notes.  Pertinent labs & imaging results that were available during my care of the patient were reviewed by me and considered in my medical decision making (see chart for details).     Patient afebrile, nontoxic, with SpO2 97%.  CXR negative.  Covid PCR pending.  Patient to quarantine until results are back.  We will treat supportively as outlined below.  Return precautions discussed, patient verbalized understanding and is agreeable to plan. Final Clinical Impressions(s) / UC Diagnoses   Final diagnoses:  Encounter for screening for COVID-19  URI with cough and congestion     Discharge Instructions     Tessalon for cough. Start flonase, atrovent nasal spray for nasal congestion/drainage. You can use over the counter nasal saline rinse such as neti pot for nasal congestion. Keep hydrated, your urine should be clear to pale yellow in color. Tylenol/motrin for fever and pain. Monitor for any worsening of symptoms, chest pain, shortness of breath, wheezing, swelling of the throat, go to the emergency department for further evaluation needed.     ED Prescriptions    Medication Sig Dispense Auth. Provider   benzonatate (TESSALON) 100 MG capsule Take 1 capsule (100 mg total) by mouth every 8 (eight) hours. 21 capsule Hall-Potvin, [place], PA-C   cetirizine (ZYRTEC ALLERGY) 10 MG tablet Take 1 tablet (10 mg total) by mouth daily. 30 tablet Hall-Potvin, [place], PA-C   fluticasone (FLONASE) 50 MCG/ACT nasal spray Place 1 spray into both nostrils daily. 16 g Hall-Potvin, [place], PA-C     PDMP not reviewed this encounter.   Hall-Potvin, [place], [place] 04/02/20 1025

## 2020-04-03 LAB — NOVEL CORONAVIRUS, NAA: SARS-CoV-2, NAA: NOT DETECTED

## 2020-04-03 LAB — SARS-COV-2, NAA 2 DAY TAT

## 2020-05-10 ENCOUNTER — Other Ambulatory Visit: Payer: Self-pay

## 2020-05-10 ENCOUNTER — Ambulatory Visit (INDEPENDENT_AMBULATORY_CARE_PROVIDER_SITE_OTHER): Payer: No Typology Code available for payment source

## 2020-05-10 DIAGNOSIS — Z3042 Encounter for surveillance of injectable contraceptive: Secondary | ICD-10-CM

## 2020-05-10 MED ORDER — MEDROXYPROGESTERONE ACETATE 150 MG/ML IM SUSP
150.0000 mg | Freq: Once | INTRAMUSCULAR | Status: AC
  Administered 2020-05-10: 150 mg via INTRAMUSCULAR

## 2020-05-10 NOTE — Progress Notes (Signed)
Medical screening examination/treatment/procedure(s) were performed by the CMA. As primary care provider I was immediately available for consulation/collaboration. I agree with above documentation. Jawanza Zambito, AGNP-C 

## 2020-05-10 NOTE — Progress Notes (Signed)
Pt came in today for her 3 month depo injection, pt received injection in her left ventrogluteal an tolerated injection well, pt was asked to scheduled her next appointment in the threshold of Dec. 8 thru Dec. 22, pt said she would call and schedule this pt is aware to schedule or she will have to take pregnancy test, pt was informed to stay in office for 15 min.

## 2020-06-16 ENCOUNTER — Ambulatory Visit
Admission: EM | Admit: 2020-06-16 | Discharge: 2020-06-16 | Disposition: A | Discharge status: Home / Self Care | Payer: No Typology Code available for payment source | Attending: Physician Assistant | Admitting: Physician Assistant

## 2020-06-16 ENCOUNTER — Other Ambulatory Visit: Payer: Self-pay

## 2020-06-16 ENCOUNTER — Encounter: Payer: Self-pay | Admitting: Emergency Medicine

## 2020-06-16 DIAGNOSIS — R112 Nausea with vomiting, unspecified: Secondary | ICD-10-CM

## 2020-06-16 DIAGNOSIS — Z20822 Contact with and (suspected) exposure to covid-19: Secondary | ICD-10-CM | POA: Diagnosis not present

## 2020-06-16 DIAGNOSIS — R1032 Left lower quadrant pain: Secondary | ICD-10-CM

## 2020-06-16 DIAGNOSIS — R1031 Right lower quadrant pain: Secondary | ICD-10-CM

## 2020-06-16 MED ORDER — ONDANSETRON 4 MG PO TBDP
4.0000 mg | ORAL_TABLET | Freq: Three times a day (TID) | ORAL | 0 refills | Status: DC | PRN
Start: 2020-06-16 — End: 2020-07-05

## 2020-06-16 MED ORDER — ONDANSETRON 4 MG PO TBDP
4.0000 mg | ORAL_TABLET | Freq: Once | ORAL | Status: AC
  Administered 2020-06-16: 4 mg via ORAL

## 2020-06-16 NOTE — Discharge Instructions (Signed)
Zofran for nausea and vomiting as needed. Keep hydrated, you urine should be clear to pale yellow in color. Bland diet, advance as tolerated. Monitor for any worsening of symptoms, nausea or vomiting not controlled by medication, worsening abdominal pain, fever, go to the emergency department for further evaluation needed.  ° °

## 2020-06-16 NOTE — ED Triage Notes (Signed)
Pt sts HA with N/V starting last night a midnight; pt denies hx of same

## 2020-06-16 NOTE — ED Provider Notes (Signed)
[facility]    CSN: [id number] Arrival date & time: 06/16/20  4098      History   Chief Complaint Chief Complaint  Patient presents with  . Headache  . Emesis    HPI Gail [name] is a 19 y.o. female.   19 year old female comes in for nausea/vomiting, abdominal pain that started last night. Nausea with 3 episodes of NBNB vomiting. Has been able to tolerate some fluid intake since last episode. Bilateral lower abdominal pain, not directly associated with N/V, pressure/cramping sensation. No diarrhea. Denies fevers. Denies urinary changes. Denies vaginal symptoms. Denies URI symptoms. Gets depo provera injections every 3 months. Not currently sexually active.      Past Medical History:  Diagnosis Date  . Anxiety   . Depression   . Medical history non-contributory   . Vision abnormalities     Patient Active Problem List   Diagnosis Date Noted  . Initiation of Depo Provera 09/24/2018  . MDD (major depressive disorder), single episode, severe with psychosis (HCC) 05/03/2018  . Severe major depression with psychotic features, mood-congruent (HCC) 04/28/2018  . Suicide ideation 04/28/2018  . GAD (generalized anxiety disorder) 04/28/2018    Past Surgical History:  Procedure Laterality Date  . WISDOM TOOTH EXTRACTION      OB History    Gravida  0   Para  0   Term  0   Preterm  0   AB  0   Living  0     SAB  0   TAB  0   Ectopic  0   Multiple  0   Live Births  0            Home Medications    Prior to Admission medications   Medication Sig Start Date End Date Taking? Authorizing Provider  cetirizine (ZYRTEC ALLERGY) 10 MG tablet Take 1 tablet (10 mg total) by mouth daily. 04/02/20   Hall-Potvin, [place], PA-C  famotidine (PEPCID) 20 MG tablet Take 1 tablet (20 mg total) by mouth 2 (two) times daily. 06/07/19   Nche, Bonna Gains, NP  fluticasone (FLONASE) 50 MCG/ACT nasal spray Place 1 spray into both nostrils  daily. 04/02/20   Hall-Potvin, [place], PA-C  medroxyPROGESTERone (DEPO-PROVERA) 150 MG/ML injection Inject 1 mL (150 mg total) into the muscle every 3 (three) months. 11/19/18   Genia Del, MD  ondansetron (ZOFRAN ODT) 4 MG disintegrating tablet Take 1 tablet (4 mg total) by mouth every 8 (eight) hours as needed for nausea or vomiting. 06/16/20   Cathie Hoops, Daquana Paddock V, PA-C  pantoprazole (PROTONIX) 40 MG tablet Take 1 tablet (40 mg total) by mouth 2 (two) times daily. Patient not taking: Reported on 06/16/2020 07/21/19 06/16/20  Tressia Danas, MD  promethazine (PHENERGAN) 25 MG tablet Take 1 tablet (25 mg total) by mouth every 6 (six) hours as needed for nausea or vomiting. Patient not taking: Reported on 06/16/2020 05/27/19 06/16/20  Jacalyn Lefevre, MD    Family History Family History  Problem Relation Age of Onset  . Rheum arthritis Mother   . Lupus Mother   . Obesity Mother   . Hypertension Mother   . Kidney disease Mother   . Healthy Sister   . Breast cancer Maternal Grandmother   . Cancer Maternal Grandmother        breast cancer  . Diabetes Maternal Grandmother   . Hypertension Maternal Grandmother   . Hyperlipidemia Maternal Grandmother   . Breast cancer Cousin   . Diabetes  Father     Social History Social History   Tobacco Use  . Smoking status: Never Smoker  . Smokeless tobacco: Never Used  Vaping Use  . Vaping Use: Never used  Substance Use Topics  . Alcohol use: Yes    Comment: Rare  . Drug use: Never     Allergies   Patient has no known allergies.   Review of Systems Review of Systems  Reason unable to perform ROS: See HPI as above.     Physical Exam Triage Vital Signs ED Triage Vitals  Enc Vitals Group     BP 06/16/20 1006 122/88     Pulse Rate 06/16/20 1006 79     Resp 06/16/20 1006 18     Temp 06/16/20 1006 98.4 F (36.9 C)     Temp Source 06/16/20 1006 Oral     SpO2 06/16/20 1006 97 %     Weight --      Height --      Head Circumference  --      Peak Flow --      Pain Score 06/16/20 1007 10     Pain Loc --      Pain Edu? --      Excl. in GC? --    No data found.  Updated Vital Signs BP 122/88 (BP Location: Left Arm)   Pulse 79   Temp 98.4 F (36.9 C) (Oral)   Resp 18   SpO2 97%   Physical Exam Constitutional:      General: She is not in acute distress.    Appearance: She is well-developed. She is not ill-appearing, toxic-appearing or diaphoretic.  HENT:     Head: Normocephalic and atraumatic.  Eyes:     Conjunctiva/sclera: Conjunctivae normal.     Pupils: Pupils are equal, round, and reactive to light.  Cardiovascular:     Rate and Rhythm: Normal rate and regular rhythm.  Pulmonary:     Effort: Pulmonary effort is normal. No respiratory distress.     Comments: LCTAB Abdominal:     General: Bowel sounds are normal.     Palpations: Abdomen is soft.     Tenderness: There is generalized abdominal tenderness. There is no right CVA tenderness, left CVA tenderness, guarding or rebound. Negative signs include McBurney's sign.  Musculoskeletal:     Cervical back: Normal range of motion and neck supple.  Skin:    General: Skin is warm and dry.  Neurological:     Mental Status: She is alert and oriented to person, place, and time.  Psychiatric:        Behavior: Behavior normal.        Judgment: Judgment normal.      UC Treatments / Results  Labs (all labs ordered are listed, but only abnormal results are displayed) Labs Reviewed  NOVEL CORONAVIRUS, NAA    EKG   Radiology No results found.  Procedures Procedures (including critical care time)  Medications Ordered in UC Medications  ondansetron (ZOFRAN-ODT) disintegrating tablet 4 mg (4 mg Oral Given 06/16/20 1026)    Initial Impression / Assessment and Plan / UC Course  I have reviewed the triage vital signs and the nursing notes.  Pertinent labs & imaging results that were available during my care of the patient were reviewed by me and  considered in my medical decision making (see chart for details).    Discussed with patient no alarming signs on exam. Zofran for nausea. Push fluids. Bland diet, advance as  tolerated. Return precautions given.  Final Clinical Impressions(s) / UC Diagnoses   Final diagnoses:  Encounter for screening laboratory testing for COVID-19 virus  Intractable vomiting with nausea, unspecified vomiting type  Bilateral lower abdominal pain    ED Prescriptions    Medication Sig Dispense Auth. Provider   ondansetron (ZOFRAN ODT) 4 MG disintegrating tablet Take 1 tablet (4 mg total) by mouth every 8 (eight) hours as needed for nausea or vomiting. 20 tablet Belinda Fisher, PA-C     PDMP not reviewed this encounter.   Belinda Fisher, PA-C 06/16/20 1034

## 2020-06-17 LAB — NOVEL CORONAVIRUS, NAA: SARS-CoV-2, NAA: NOT DETECTED

## 2020-06-17 LAB — SARS-COV-2, NAA 2 DAY TAT

## 2020-07-05 ENCOUNTER — Other Ambulatory Visit: Payer: Self-pay

## 2020-07-05 ENCOUNTER — Ambulatory Visit
Admission: EM | Admit: 2020-07-05 | Discharge: 2020-07-05 | Disposition: A | Discharge status: Home / Self Care | Payer: No Typology Code available for payment source | Attending: Internal Medicine | Admitting: Internal Medicine

## 2020-07-05 ENCOUNTER — Encounter: Payer: Self-pay | Admitting: Physician Assistant

## 2020-07-05 ENCOUNTER — Ambulatory Visit (INDEPENDENT_AMBULATORY_CARE_PROVIDER_SITE_OTHER): Payer: No Typology Code available for payment source | Admitting: Physician Assistant

## 2020-07-05 ENCOUNTER — Encounter: Payer: Self-pay | Admitting: Emergency Medicine

## 2020-07-05 VITALS — BP 92/68 | HR 76 | Ht 61.0 in | Wt 107.0 lb

## 2020-07-05 DIAGNOSIS — R519 Headache, unspecified: Secondary | ICD-10-CM | POA: Diagnosis not present

## 2020-07-05 DIAGNOSIS — M791 Myalgia, unspecified site: Secondary | ICD-10-CM

## 2020-07-05 DIAGNOSIS — Z20822 Contact with and (suspected) exposure to covid-19: Secondary | ICD-10-CM | POA: Diagnosis not present

## 2020-07-05 DIAGNOSIS — K589 Irritable bowel syndrome without diarrhea: Secondary | ICD-10-CM | POA: Diagnosis not present

## 2020-07-05 DIAGNOSIS — R112 Nausea with vomiting, unspecified: Secondary | ICD-10-CM

## 2020-07-05 MED ORDER — GLYCOPYRROLATE 2 MG PO TABS
2.0000 mg | ORAL_TABLET | Freq: Two times a day (BID) | ORAL | 6 refills | Status: DC | PRN
Start: 2020-07-05 — End: 2020-11-29

## 2020-07-05 MED ORDER — FAMOTIDINE 20 MG PO TABS: 20.0000 mg | ORAL_TABLET | Freq: Two times a day (BID) | ORAL | 6 refills | Status: DC

## 2020-07-05 MED ORDER — CETIRIZINE HCL 10 MG PO TABS: 10.0000 mg | ORAL_TABLET | Freq: Every day | ORAL | 0 refills | Status: DC

## 2020-07-05 MED ORDER — NAPROXEN 500 MG PO TABS
500.0000 mg | ORAL_TABLET | Freq: Two times a day (BID) | ORAL | 0 refills | Status: DC
Start: 2020-07-05 — End: 2020-11-29

## 2020-07-05 MED ORDER — ONDANSETRON 4 MG PO TBDP: 4.0000 mg | ORAL_TABLET | Freq: Three times a day (TID) | ORAL | 3 refills | Status: DC | PRN

## 2020-07-05 NOTE — Discharge Instructions (Signed)
Your COVID test is pending - it is important to quarantine / isolate at home until your results are back. °If you test positive and would like further evaluation for persistent or worsening symptoms, you may schedule an E-visit or virtual (video) visit throughout the [facility] MyChart app or website. ° °PLEASE NOTE: If you develop severe chest pain or shortness of breath please go to the ER or call [phone] for further evaluation --> DO NOT schedule electronic or virtual visits for this. °Please call our office for further guidance / recommendations as needed. ° °For information about the Covid vaccine, please visit [email or web address] °

## 2020-07-05 NOTE — Progress Notes (Signed)
Subjective:    Patient ID: Gail [name], female    DOB: 2000-11-05, 19 y.o.   MRN: [id number]  HPI  Gail [name] is a pleasant 19 year old female, established with Dr. Orvan Falconer who was last seen here in December 2020.  Patient has diagnosis of GERD and IBS.  She had undergone fairly extensive work-up last fall with complaints of abdominal pain nausea bloating, intermittent vomiting and at that time had had about a 10 pound weight loss.  She had also been having issues with chronic constipation. She underwent EGD which was unremarkable, no evidence for eosinophilic esophagitis, gastric biopsies negative for H. pylori.  She had upper abdominal ultrasound which was negative and then CCK HIDA scan which showed a normal EF of 73%.  She had been placed on a PPI and famotidine and was given Zofran. 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 She comes in today stating that her symptoms have been continuing to be present over the past year "up and down" with some good days and some bad days.  She has actually gained her weight back and is at 107 now.  She continues to have complaints of intermittent nausea that is not occurring on a daily basis at this point and infrequent episodes of vomiting.  She is trying to eat 3 meals a day.  She is now complaining of stomachache and urgency postprandially with loose stools which have been occurring over the past week or so.  No melena or hematochezia. She is not on any medication for anxiety or depression at this point.  It sounds as if she had lost contact with the psychiatrist she had seen previously and in fact she could not remember that person's name.  She has been talking with her mom about getting reestablished with psychiatric care so she can get back on medication. 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  Review of Systems Pertinent positive and negative review of systems  were noted in the above HPI section.  All other review of systems was otherwise negative.  Outpatient Encounter Medications as of 07/05/2020  Medication Sig  . cetirizine (ZYRTEC ALLERGY) 10 MG tablet Take 1 tablet (10 mg total) by mouth daily.  . famotidine (PEPCID) 20 MG tablet Take 1 tablet (20 mg total) by mouth 2 (two) times daily.  . fluticasone (FLONASE) 50 MCG/ACT nasal spray Place 1 spray into both nostrils daily.  . medroxyPROGESTERone (DEPO-PROVERA) 150 MG/ML injection Inject 1 mL (150 mg total) into the muscle every 3 (three) months.  . naproxen (NAPROSYN) 500 MG tablet Take 1 tablet (500 mg total) by mouth 2 (two) times daily.  . ondansetron (ZOFRAN ODT) 4 MG disintegrating tablet Take 1 tablet (4 mg total) by mouth every 8 (eight) hours as needed for nausea or vomiting.  . [DISCONTINUED] famotidine (PEPCID) 20 MG tablet Take 1 tablet (20 mg total) by mouth 2 (two) times daily.  . [DISCONTINUED] ondansetron (ZOFRAN ODT) 4 MG disintegrating tablet Take 1 tablet (4 mg total) by mouth every 8 (eight) hours as needed for nausea or vomiting.  Marland Kitchen glycopyrrolate (ROBINUL) 2 MG tablet Take 1 tablet (2 mg total) by mouth 2 (two) times daily as needed (, Abdominal Cramping/IBS/Diarrhea).  . [DISCONTINUED] cetirizine (ZYRTEC ALLERGY) 10 MG tablet Take 1 tablet (10 mg total) by mouth daily.  . [DISCONTINUED] pantoprazole (PROTONIX) 40 MG tablet Take 1 tablet (40 mg total) by mouth 2 (two) times daily. (Patient not  taking: Reported on 06/16/2020)  . [DISCONTINUED] promethazine (PHENERGAN) 25 MG tablet Take 1 tablet (25 mg total) by mouth every 6 (six) hours as needed for nausea or vomiting. (Patient not taking: Reported on 06/16/2020)   No facility-administered encounter medications on file as of 07/05/2020.   No Known Allergies Patient Active Problem List   Diagnosis Date Noted  . Initiation of Depo Provera 09/24/2018  . MDD (major depressive disorder), single episode, severe with psychosis  (HCC) 05/03/2018  . Severe major depression with psychotic features, mood-congruent (HCC) 04/28/2018  . Suicide ideation 04/28/2018  . GAD (generalized anxiety disorder) 04/28/2018   Social History   Socioeconomic History  . Marital status: Single    Spouse name: Not on file  . Number of children: Not on file  . Years of education: Not on file  . Highest education level: Not on file  Occupational History  . Not on file  Tobacco Use  . Smoking status: Never Smoker  . Smokeless tobacco: Never Used  Vaping Use  . Vaping Use: Never used  Substance and Sexual Activity  . Alcohol use: Yes    Comment: Rare  . Drug use: Never  . Sexual activity: Not Currently    Birth control/protection: Injection    Comment: 1st intercourse- 16, partners- 2,   Other Topics Concern  . Not on file  Social History Narrative  . Not on file   Social Determinants of Health   Financial Resource Strain:   . Difficulty of Paying Living Expenses: Not on file  Food Insecurity:   . Worried About Programme researcher, broadcasting/film/video in the Last Year: Not on file  . Ran Out of Food in the Last Year: Not on file  Transportation Needs:   . Lack of Transportation (Medical): Not on file  . Lack of Transportation (Non-Medical): Not on file  Physical Activity:   . Days of Exercise per Week: Not on file  . Minutes of Exercise per Session: Not on file  Stress:   . Feeling of Stress : Not on file  Social Connections:   . Frequency of Communication with Friends and Family: Not on file  . Frequency of Social Gatherings with Friends and Family: Not on file  . Attends Religious Services: Not on file  . Active Member of Clubs or Organizations: Not on file  . Attends Banker Meetings: Not on file  . Marital Status: Not on file  Intimate Partner Violence:   . Fear of Current or Ex-Partner: Not on file  . Emotionally Abused: Not on file  . Physically Abused: Not on file  . Sexually Abused: Not on file    Gail [name]'s family history includes Breast cancer in her cousin and maternal grandmother; Cancer in her maternal grandmother; Diabetes in her father and maternal grandmother; Healthy in her sister; Hyperlipidemia in her maternal grandmother; Hypertension in her maternal grandmother and mother; Kidney disease in her mother; Lupus in her mother; Obesity in her mother; Rheum arthritis in her mother.      Objective:    Vitals:   07/05/20 1350  BP: 92/68  Pulse: 76    Physical Exam Well-developed well-nourished  Thin young female  in no acute distress.  Height, Weight,107  BMI 20.2  HEENT; nontraumatic normocephalic, EOMI, PE RR LA, sclera anicteric. Oropharynx;not done Neck; supple, no JVD Cardiovascular; regular rate and rhythm with S1-S2, no murmur rub or gallop Pulmonary; Clear bilaterally Abdomen; soft, nontender, nondistended, no palpable mass or hepatosplenomegaly,  bowel sounds are active Rectal;not done Skin; benign exam, no jaundice rash or appreciable lesions Extremities; no clubbing cyanosis or edema skin warm and dry Neuro/Psych; alert and oriented x4, grossly nonfocal mood and affect appropriate       Assessment & Plan:   #56 19 year old female with GERD, ongoing intermittent nausea and now with infrequent episodes of vomiting.  Work-up last fall unremarkable as outlined above. I suspect there is a component of functional dyspepsia and/or nausea exacerbated by underlying anxiety. #2 postprandial abdominal cramping urgency and loose stools worse over the past week or so.  Symptoms are consistent with IBS. #3 history of major depressive disorder, prior depression with psychosis and generalized anxiety disorder  Plan; refill Zofran 4 mg every 6 hours as needed for nausea, Continue Pepcid 20 mg p.o. twice daily Add trial of glycopyrrolate 2 mg p.o. twice daily Of asked her to call back in a couple of weeks if the glycopyrrolate is not helping. Was also able to  determine that her previous psychiatrist had been Dr. Elsie Saas associated with [facility] and she was provided with that information so she can make an appointment  Allyson Tineo Oswald Hillock PA-C 07/05/2020   Cc: Anne Ng, NP

## 2020-07-05 NOTE — ED Triage Notes (Signed)
Patient c/o headache and body aches x 1 day.   Patient states "I feel like I'm burning up".   Patient denies SOB and cough.   Patient stated she has a recent COVID-19 exposure.

## 2020-07-05 NOTE — Progress Notes (Signed)
Reviewed and agree with management plans. ? ?Annison Birchard L. Jibri Schriefer, MD, MPH  ?

## 2020-07-05 NOTE — Patient Instructions (Signed)
If you are age 20 or older, your body mass index should be between 23-30. Your Body mass index is 20.22 kg/m. If this is out of the aforementioned range listed, please consider follow up with your Primary Care Provider.  If you are age 6 or younger, your body mass index should be between 19-25. Your Body mass index is 20.22 kg/m. If this is out of the aformentioned range listed, please consider follow up with your Primary Care Provider.   Continue Pepcid and Zofran  START Glycopyrrolate 2 mg twice daily as needed for abdominal pain/IBS/Diarrhea   Get an Appointment with [facility]-Dr. Lavonia Drafts (Psychiatrist)  Follow up as needed.

## 2020-07-05 NOTE — ED Provider Notes (Signed)
[facility]    CSN: [id number] Arrival date & time: 07/05/20  1253      History   Chief Complaint Chief Complaint  Patient presents with  . Headache    HPI Gail [name] is a 19 y.o. female  Presenting for Covid testing: Exposure: aunt Date of exposure: repeatedly over last wk Any fever, symptoms since exposure: yes - Frontal headache with myalgias.  No nasal congestion, sore throat, cough.  Has not take anything for symptoms.  Past Medical History:  Diagnosis Date  . Anxiety   . Depression   . Medical history non-contributory   . Vision abnormalities     Patient Active Problem List   Diagnosis Date Noted  . Initiation of Depo Provera 09/24/2018  . MDD (major depressive disorder), single episode, severe with psychosis (HCC) 05/03/2018  . Severe major depression with psychotic features, mood-congruent (HCC) 04/28/2018  . Suicide ideation 04/28/2018  . GAD (generalized anxiety disorder) 04/28/2018    Past Surgical History:  Procedure Laterality Date  . WISDOM TOOTH EXTRACTION      OB History    Gravida  0   Para  0   Term  0   Preterm  0   AB  0   Living  0     SAB  0   TAB  0   Ectopic  0   Multiple  0   Live Births  0            Home Medications    Prior to Admission medications   Medication Sig Start Date End Date Taking? Authorizing Provider  cetirizine (ZYRTEC ALLERGY) 10 MG tablet Take 1 tablet (10 mg total) by mouth daily. 07/05/20   Hall-Potvin, [place], PA-C  famotidine (PEPCID) 20 MG tablet Take 1 tablet (20 mg total) by mouth 2 (two) times daily. 06/07/19   Nche, Bonna Gains, NP  fluticasone (FLONASE) 50 MCG/ACT nasal spray Place 1 spray into both nostrils daily. 04/02/20   Hall-Potvin, [place], PA-C  medroxyPROGESTERone (DEPO-PROVERA) 150 MG/ML injection Inject 1 mL (150 mg total) into the muscle every 3 (three) months. 11/19/18   Genia Del, MD  naproxen (NAPROSYN) 500 MG tablet Take 1  tablet (500 mg total) by mouth 2 (two) times daily. 07/05/20   Hall-Potvin, [place], PA-C  ondansetron (ZOFRAN ODT) 4 MG disintegrating tablet Take 1 tablet (4 mg total) by mouth every 8 (eight) hours as needed for nausea or vomiting. 06/16/20   Cathie Hoops, Amy V, PA-C  pantoprazole (PROTONIX) 40 MG tablet Take 1 tablet (40 mg total) by mouth 2 (two) times daily. Patient not taking: Reported on 06/16/2020 07/21/19 06/16/20  Tressia Danas, MD  promethazine (PHENERGAN) 25 MG tablet Take 1 tablet (25 mg total) by mouth every 6 (six) hours as needed for nausea or vomiting. Patient not taking: Reported on 06/16/2020 05/27/19 06/16/20  Jacalyn Lefevre, MD    Family History Family History  Problem Relation Age of Onset  . Rheum arthritis Mother   . Lupus Mother   . Obesity Mother   . Hypertension Mother   . Kidney disease Mother   . Healthy Sister   . Breast cancer Maternal Grandmother   . Cancer Maternal Grandmother        breast cancer  . Diabetes Maternal Grandmother   . Hypertension Maternal Grandmother   . Hyperlipidemia Maternal Grandmother   . Breast cancer Cousin   . Diabetes Father     Social History Social History   Tobacco Use  .  Smoking status: Never Smoker  . Smokeless tobacco: Never Used  Vaping Use  . Vaping Use: Never used  Substance Use Topics  . Alcohol use: Yes    Comment: Rare  . Drug use: Never     Allergies   Patient has no known allergies.   Review of Systems Review of Systems  Constitutional: Positive for chills and fatigue. Negative for fever.  HENT: Negative for congestion, dental problem, ear pain, facial swelling, hearing loss, sinus pain, sore throat, trouble swallowing and voice change.   Eyes: Negative for photophobia, pain and visual disturbance.  Respiratory: Negative for cough and shortness of breath.   Cardiovascular: Negative for chest pain and palpitations.  Gastrointestinal: Negative for diarrhea and vomiting.  Musculoskeletal: Positive  for myalgias. Negative for arthralgias, back pain, neck pain and neck stiffness.  Neurological: Positive for headaches. Negative for dizziness, tremors, facial asymmetry, speech difficulty, weakness, light-headedness and numbness.     Physical Exam Triage Vital Signs ED Triage Vitals  Enc Vitals Group     BP 07/05/20 1302 106/73     Pulse Rate 07/05/20 1302 86     Resp 07/05/20 1302 16     Temp 07/05/20 1302 98.3 F (36.8 C)     Temp Source 07/05/20 1302 Oral     SpO2 07/05/20 1302 98 %     Weight --      Height --      Head Circumference --      Peak Flow --      Pain Score 07/05/20 1259 6     Pain Loc --      Pain Edu? --      Excl. in GC? --    No data found.  Updated Vital Signs BP 106/73 (BP Location: Left Arm)   Pulse 86   Temp 98.3 F (36.8 C) (Oral)   Resp 16   LMP  (LMP Unknown) Comment: Depo   SpO2 98%   Visual Acuity Right Eye Distance:   Left Eye Distance:   Bilateral Distance:    Right Eye Near:   Left Eye Near:    Bilateral Near:     Physical Exam Constitutional:      General: She is not in acute distress.    Appearance: She is well-developed. She is not ill-appearing or diaphoretic.  HENT:     Head: Normocephalic and atraumatic.     Mouth/Throat:     Mouth: Mucous membranes are moist.     Pharynx: Oropharynx is clear. No oropharyngeal exudate or posterior oropharyngeal erythema.  Eyes:     General: No scleral icterus.    Conjunctiva/sclera: Conjunctivae normal.     Pupils: Pupils are equal, round, and reactive to light.  Neck:     Comments: Trachea midline, negative JVD Cardiovascular:     Rate and Rhythm: Normal rate and regular rhythm.     Heart sounds: No murmur heard.  No gallop.   Pulmonary:     Effort: Pulmonary effort is normal. No respiratory distress.     Breath sounds: No wheezing, rhonchi or rales.  Musculoskeletal:     Cervical back: Neck supple. No tenderness.  Lymphadenopathy:     Cervical: No cervical adenopathy.    Skin:    Capillary Refill: Capillary refill takes less than 2 seconds.     Coloration: Skin is not jaundiced or pale.     Findings: No rash.  Neurological:     General: No focal deficit present.  Mental Status: She is alert and oriented to person, place, and time.      UC Treatments / Results  Labs (all labs ordered are listed, but only abnormal results are displayed) Labs Reviewed  NOVEL CORONAVIRUS, NAA    EKG   Radiology No results found.  Procedures Procedures (including critical care time)  Medications Ordered in UC Medications - No data to display  Initial Impression / Assessment and Plan / UC Course  I have reviewed the triage vital signs and the nursing notes.  Pertinent labs & imaging results that were available during my care of the patient were reviewed by me and considered in my medical decision making (see chart for details).     Patient afebrile, nontoxic, with SpO2 98%.  Covid PCR pending.  Patient to quarantine until results are back.  We will treat supportively as outlined below.  Return precautions discussed, patient verbalized understanding and is agreeable to plan. Final Clinical Impressions(s) / UC Diagnoses   Final diagnoses:  Frontal headache  Exposure to COVID-19 virus  Myalgia     Discharge Instructions     Your COVID test is pending - it is important to quarantine / isolate at home until your results are back. If you test positive and would like further evaluation for persistent or worsening symptoms, you may schedule an E-visit or virtual (video) visit throughout the [facility] app or website.  PLEASE NOTE: If you develop severe chest pain or shortness of breath please go to the ER or call [phone] for further evaluation --> DO NOT schedule electronic or virtual visits for this. Please call our office for further guidance / recommendations as needed.  For information about the Covid vaccine, please visit  [email or web address]    ED Prescriptions    Medication Sig Dispense Auth. Provider   naproxen (NAPROSYN) 500 MG tablet Take 1 tablet (500 mg total) by mouth 2 (two) times daily. 30 tablet Hall-Potvin, [place], PA-C   cetirizine (ZYRTEC ALLERGY) 10 MG tablet Take 1 tablet (10 mg total) by mouth daily. 30 tablet Hall-Potvin, [place], PA-C     PDMP not reviewed this encounter.   Hall-Potvin, [place], [place] 07/05/20 1334

## 2020-07-06 LAB — NOVEL CORONAVIRUS, NAA: SARS-CoV-2, NAA: NOT DETECTED

## 2020-07-06 LAB — SARS-COV-2, NAA 2 DAY TAT

## 2020-08-08 ENCOUNTER — Other Ambulatory Visit: Payer: Self-pay

## 2020-08-08 ENCOUNTER — Ambulatory Visit (INDEPENDENT_AMBULATORY_CARE_PROVIDER_SITE_OTHER): Payer: No Typology Code available for payment source

## 2020-08-08 DIAGNOSIS — Z3042 Encounter for surveillance of injectable contraceptive: Secondary | ICD-10-CM

## 2020-08-08 MED ORDER — MEDROXYPROGESTERONE ACETATE 150 MG/ML IM SUSP
150.0000 mg | Freq: Once | INTRAMUSCULAR | Status: AC
  Administered 2020-08-08: 09:00:00 150 mg via INTRAMUSCULAR

## 2020-08-08 NOTE — Progress Notes (Signed)
Pt came in today for her 3 month depo injection, pt received injection in her right ventrogluteal an tolerated injection well, pt was asked to scheduled her next appointment in the threshold of March. 8 thru March. 22, pt said she would schedule appointment upon leaving. Pt is aware to schedule or she will have to take pregnancy test, pt was informed to stay in office for 15 min.

## 2020-08-26 ENCOUNTER — Other Ambulatory Visit: Payer: Self-pay

## 2020-08-26 ENCOUNTER — Emergency Department (HOSPITAL_BASED_OUTPATIENT_CLINIC_OR_DEPARTMENT_OTHER)
Admission: EM | Admit: 2020-08-26 | Discharge: 2020-08-26 | Disposition: A | Discharge status: Home / Self Care | Payer: No Typology Code available for payment source | Attending: Emergency Medicine | Admitting: Emergency Medicine

## 2020-08-26 ENCOUNTER — Encounter (HOSPITAL_BASED_OUTPATIENT_CLINIC_OR_DEPARTMENT_OTHER): Payer: Self-pay | Admitting: Emergency Medicine

## 2020-08-26 DIAGNOSIS — U071 COVID-19: Secondary | ICD-10-CM | POA: Insufficient documentation

## 2020-08-26 DIAGNOSIS — R112 Nausea with vomiting, unspecified: Secondary | ICD-10-CM

## 2020-08-26 DIAGNOSIS — R197 Diarrhea, unspecified: Secondary | ICD-10-CM

## 2020-08-26 LAB — SARS CORONAVIRUS 2 (TAT 6-24 HRS): SARS Coronavirus 2: POSITIVE — AB

## 2020-08-26 MED ORDER — ONDANSETRON 4 MG PO TBDP
4.0000 mg | ORAL_TABLET | Freq: Three times a day (TID) | ORAL | 0 refills | Status: DC | PRN
Start: 2020-08-26 — End: 2020-11-29

## 2020-08-26 MED ORDER — ONDANSETRON 4 MG PO TBDP
4.0000 mg | ORAL_TABLET | Freq: Once | ORAL | Status: AC
  Administered 2020-08-26: 4 mg via ORAL
  Filled 2020-08-26: qty 1

## 2020-08-26 NOTE — ED Triage Notes (Signed)
Cough, congestion, diarrhea x 2-3 days. +COVID exposure.

## 2020-08-26 NOTE — ED Provider Notes (Signed)
[facility] EMERGENCY DEPARTMENT Provider Note   CSN: [id number] Arrival date & time: 08/26/20  0845     History Chief Complaint  Patient presents with  . COVID symptoms    Gail [name] is a 20 y.o. female.  Patient presents to the emergency department for evaluation of Covid-like symptoms.  Patient had sick contacts with her family.  She is vaccinated.  No booster.  Patient reports nausea, vomiting, diarrhea, muscle aches, congestion and cough.  Today is day 3 of illness.  No chest or abdominal pain.  She has been taking TheraFlu at home.  The onset of this condition was acute. The course is constant. Aggravating factors: none. Alleviating factors: none.          Past Medical History:  Diagnosis Date  . Anxiety   . Depression   . Medical history non-contributory   . Vision abnormalities     Patient Active Problem List   Diagnosis Date Noted  . Initiation of Depo Provera 09/24/2018  . MDD (major depressive disorder), single episode, severe with psychosis (HCC) 05/03/2018  . Severe major depression with psychotic features, mood-congruent (HCC) 04/28/2018  . Suicide ideation 04/28/2018  . GAD (generalized anxiety disorder) 04/28/2018    Past Surgical History:  Procedure Laterality Date  . WISDOM TOOTH EXTRACTION       OB History    Gravida  0   Para  0   Term  0   Preterm  0   AB  0   Living  0     SAB  0   IAB  0   Ectopic  0   Multiple  0   Live Births  0           Family History  Problem Relation Age of Onset  . Rheum arthritis Mother   . Lupus Mother   . Obesity Mother   . Hypertension Mother   . Kidney disease Mother   . Healthy Sister   . Breast cancer Maternal Grandmother   . Cancer Maternal Grandmother        breast cancer  . Diabetes Maternal Grandmother   . Hypertension Maternal Grandmother   . Hyperlipidemia Maternal Grandmother   . Breast cancer Cousin   . Diabetes Father     Social History    Tobacco Use  . Smoking status: Never Smoker  . Smokeless tobacco: Never Used  Vaping Use  . Vaping Use: Never used  Substance Use Topics  . Alcohol use: Yes    Comment: Rare  . Drug use: Never    Home Medications Prior to Admission medications   Medication Sig Start Date End Date Taking? Authorizing Provider  ondansetron (ZOFRAN ODT) 4 MG disintegrating tablet Take 1 tablet (4 mg total) by mouth every 8 (eight) hours as needed for nausea or vomiting. 08/26/20  Yes Renne Crigler, PA-C  cetirizine (ZYRTEC ALLERGY) 10 MG tablet Take 1 tablet (10 mg total) by mouth daily. 07/05/20   Hall-Potvin, [place], PA-C  famotidine (PEPCID) 20 MG tablet Take 1 tablet (20 mg total) by mouth 2 (two) times daily. 07/05/20   Esterwood, Amy S, PA-C  fluticasone (FLONASE) 50 MCG/ACT nasal spray Place 1 spray into both nostrils daily. 04/02/20   Hall-Potvin, [place], PA-C  glycopyrrolate (ROBINUL) 2 MG tablet Take 1 tablet (2 mg total) by mouth 2 (two) times daily as needed (, Abdominal Cramping/IBS/Diarrhea). 07/05/20   Esterwood, Amy S, PA-C  medroxyPROGESTERone (DEPO-PROVERA) 150 MG/ML injection Inject 1 mL (  150 mg total) into the muscle every 3 (three) months. 11/19/18   Genia Del, MD  naproxen (NAPROSYN) 500 MG tablet Take 1 tablet (500 mg total) by mouth 2 (two) times daily. 07/05/20   Hall-Potvin, [place], PA-C  pantoprazole (PROTONIX) 40 MG tablet Take 1 tablet (40 mg total) by mouth 2 (two) times daily. Patient not taking: Reported on 06/16/2020 07/21/19 06/16/20  Tressia Danas, MD  promethazine (PHENERGAN) 25 MG tablet Take 1 tablet (25 mg total) by mouth every 6 (six) hours as needed for nausea or vomiting. Patient not taking: Reported on 06/16/2020 05/27/19 06/16/20  Jacalyn Lefevre, MD    Allergies    Patient has no known allergies.  Review of Systems   Review of Systems  Constitutional: Positive for chills and fatigue. Negative for fever.  HENT: Positive for congestion.  Negative for ear pain, rhinorrhea, sinus pressure and sore throat.   Eyes: Negative for redness.  Respiratory: Positive for cough and shortness of breath. Negative for wheezing.   Gastrointestinal: Positive for diarrhea, nausea and vomiting. Negative for abdominal pain.  Genitourinary: Negative for dysuria.  Musculoskeletal: Positive for myalgias. Negative for neck stiffness.  Skin: Negative for rash.  Neurological: Positive for headaches.  Hematological: Negative for adenopathy.    Physical Exam Updated Vital Signs BP (!) 103/92 (BP Location: Left Arm)   Pulse 69   Temp 98.3 F (36.8 C) (Oral)   Resp 18   Ht 5\' 2"  (1.575 m)   Wt 49.9 kg   SpO2 100%   BMI 20.12 kg/m   Physical Exam Vitals and nursing note reviewed.  Constitutional:      Appearance: She is well-developed and well-nourished.  HENT:     Head: Normocephalic and atraumatic.     Jaw: No trismus.     Right Ear: Tympanic membrane, ear canal and external ear normal.     Left Ear: Tympanic membrane, ear canal and external ear normal.     Nose: Nose normal. No mucosal edema or rhinorrhea.     Mouth/Throat:     Mouth: Oropharynx is clear and moist and mucous membranes are normal. Mucous membranes are not dry. No oral lesions.     Pharynx: Uvula midline. No oropharyngeal exudate, posterior oropharyngeal edema, posterior oropharyngeal erythema or uvula swelling.     Tonsils: No tonsillar abscesses.  Eyes:     General:        Right eye: No discharge.        Left eye: No discharge.     Conjunctiva/sclera: Conjunctivae normal.  Cardiovascular:     Rate and Rhythm: Normal rate and regular rhythm.     Heart sounds: Normal heart sounds.  Pulmonary:     Effort: Pulmonary effort is normal. No respiratory distress.     Breath sounds: Normal breath sounds. No wheezing or rales.  Abdominal:     Palpations: Abdomen is soft.     Tenderness: There is no abdominal tenderness.  Musculoskeletal:     Cervical back: Normal range  of motion and neck supple.  Lymphadenopathy:     Cervical: No cervical adenopathy.  Skin:    General: Skin is warm and dry.  Neurological:     Mental Status: She is alert.  Psychiatric:        Mood and Affect: Mood and affect normal.     ED Results / Procedures / Treatments   Labs (all labs ordered are listed, but only abnormal results are displayed) Labs Reviewed  SARS CORONAVIRUS 2 (TAT  6-24 HRS)    EKG None  Radiology No results found.  Procedures Procedures (including critical care time)  Medications Ordered in ED Medications  ondansetron (ZOFRAN-ODT) disintegrating tablet 4 mg (4 mg Oral Given 08/26/20 1121)    ED Course  I have reviewed the triage vital signs and the nursing notes.  Pertinent labs & imaging results that were available during my care of the patient were reviewed by me and considered in my medical decision making (see chart for details).  Patient seen and examined. Work-up initiated. Medications ordered.   Vital signs reviewed and are as follows: BP (!) 103/92 (BP Location: Left Arm)   Pulse 69   Temp 98.3 F (36.8 C) (Oral)   Resp 18   Ht 5\' 2"  (1.575 m)   Wt 49.9 kg   SpO2 100%   BMI 20.12 kg/m    Detailed discussion had with with patient regarding COVID-19 precautions and written instructions given as well.  We discussed need to isolate themselves for 5 days from onset of symptoms and have 24 hours of improvement prior to breaking isolation.  We discussed signs symptoms to return which include worsening shortness of breath, trouble breathing, or increased work of breathing.  Also return with persistent vomiting, confusion, passing out, or if they have any other concerns. Counseled on the need for rest and good hydration. Discussed that high-risk contacts should be aware of positive result and they need to quarantine and be tested if they develop any symptoms. Patient verbalizes understanding.   Amil Moseman [name] was evaluated in  Emergency Department on 08/26/2020 for the symptoms described in the history of present illness. She was evaluated in the context of the global COVID-19 pandemic, which necessitated consideration that the patient might be at risk for infection with the SARS-CoV-2 virus that causes COVID-19. Institutional protocols and algorithms that pertain to the evaluation of patients at risk for COVID-19 are in a state of rapid change based on information released by regulatory bodies including the CDC and federal and state organizations. These policies and algorithms were followed during the patient's care in the ED.      MDM Rules/Calculators/A&P                          Patient with COVID-like symptoms. Testing ordered. Vitals reassuring. Plan for supportive management. Currently tolerating PO fluids in small amounts.    Final Clinical Impression(s) / ED Diagnoses Final diagnoses:  Nausea vomiting and diarrhea    Rx / DC Orders ED Discharge Orders         Ordered    ondansetron (ZOFRAN ODT) 4 MG disintegrating tablet  Every 8 hours PRN        08/26/20 1121           10/24/20, PA-C 08/26/20 1128    10/24/20, MD 08/29/20 1458

## 2020-08-26 NOTE — Discharge Instructions (Signed)
Please read and follow all provided instructions.  Your diagnoses today include:  1. Nausea vomiting and diarrhea     Tests performed today include:  Vital signs. See below for your results today.   COVID test - pending, check mychart for results  Medications prescribed:  Zofran (ondansetron) - for nausea and vomiting  Take any prescribed medications only as directed. Treatment for your infection is aimed at treating the symptoms. There are no medications, such as antibiotics, that will cure your infection.   Home care instructions:  Follow any educational materials contained in this packet.   Your illness is contagious and can be spread to others, especially during the first 3 or 4 days. It cannot be cured by antibiotics or other medicines. Take basic precautions such as washing your hands often, covering your mouth when you cough or sneeze, and avoiding public places where you could spread your illness to others.   Please continue drinking plenty of fluids.  Use over-the-counter medicines as needed as directed on packaging for symptom relief.  You may also use ibuprofen or tylenol as directed on packaging for pain or fever.  Do not take multiple medicines containing Tylenol or acetaminophen to avoid taking too much of this medication.  If you are positive for Covid-19, you should isolate yourself and not be exposed to other people for 5 days after your symptoms began. If you are not feeling better at day 5, you need to isolate yourself for a total of 10 days. If you are feeling better by day 5, you should wear a mask properly, over your nose and mouth, at all times while around other people until 10 days after your symptoms started.   Follow-up instructions: Please follow-up with your primary care provider as needed for further evaluation of your symptoms if you are not feeling better.   Return instructions:   Please return to the Emergency Department if you experience worsening  symptoms.   Return to the emergency department if you have worsening shortness of breath breathing or increased work of breathing, persistent vomiting  RETURN IMMEDIATELY IF you develop shortness of breath, confusion or altered mental status, a new rash, become dizzy, faint, or poorly responsive, or are unable to be cared for at home.  Please return if you have persistent vomiting and cannot keep down fluids or develop a fever that is not controlled by tylenol or motrin.    Please return if you have any other emergent concerns.  Additional Information:  Your vital signs today were: BP (!) 103/92 (BP Location: Left Arm)    Pulse 69    Temp 98.3 F (36.8 C) (Oral)    Resp 18    Ht 5\' 2"  (1.575 m)    Wt 49.9 kg    SpO2 100%    BMI 20.12 kg/m  If your blood pressure (BP) was elevated above 135/85 this visit, please have this repeated by your doctor within one month. --------------

## 2020-08-28 ENCOUNTER — Telehealth: Payer: Self-pay | Admitting: Nurse Practitioner

## 2020-08-28 ENCOUNTER — Encounter: Payer: No Typology Code available for payment source | Admitting: Nurse Practitioner

## 2020-08-28 NOTE — Telephone Encounter (Signed)
Patient tested positive for COVID. She has a cough, fever, body aches, muscle pain and sore throat. She wants to know how long she should quarantine before returning to work.. Please call her mom at [phone] [facility]) and advise.

## 2020-08-28 NOTE — Telephone Encounter (Signed)
Spoke with patient and mother, appointment made to follow up, and discuss quarantine and return to work.

## 2020-08-29 ENCOUNTER — Telehealth (INDEPENDENT_AMBULATORY_CARE_PROVIDER_SITE_OTHER): Payer: No Typology Code available for payment source | Admitting: Family

## 2020-08-29 ENCOUNTER — Encounter: Payer: Self-pay | Admitting: Family

## 2020-08-29 VITALS — Ht 62.0 in | Wt 110.0 lb

## 2020-08-29 DIAGNOSIS — U071 COVID-19: Secondary | ICD-10-CM

## 2020-08-29 DIAGNOSIS — R0981 Nasal congestion: Secondary | ICD-10-CM

## 2020-08-29 MED ORDER — PREDNISONE 10 MG (21) PO TBPK
ORAL_TABLET | ORAL | 0 refills | Status: DC
Start: 2020-08-29 — End: 2021-01-02

## 2020-08-29 NOTE — Progress Notes (Signed)
Virtual Visit via Video   I connected with patient on 08/29/20 at  3:40 PM EST by a video enabled telemedicine application and verified that I am speaking with the correct person using two identifiers.  Location patient: Home Location provider: [employer], Office Persons participating in the virtual visit: Patient, Provider, CMA   I discussed the limitations of evaluation and management by telemedicine and the availability of in person appointments. The patient expressed understanding and agreed to proceed.  Subjective:   HPI:   20 year old female is in for a hospital follow-up from January 8th 2022 via video visit with concerns of cough, chest congestion, +COVID-19. She had nausea vomiting and diarrhea that has resolved. Her symptoms began January 5th. She is vaccinated but not boosted. She is requesting a note for work and medication to help with nasal congestion.    ROS:   See pertinent positives and negatives per HPI.  Patient Active Problem List   Diagnosis Date Noted  . Initiation of Depo Provera 09/24/2018  . MDD (major depressive disorder), single episode, severe with psychosis (HCC) 05/03/2018  . Severe major depression with psychotic features, mood-congruent (HCC) 04/28/2018  . Suicide ideation 04/28/2018  . GAD (generalized anxiety disorder) 04/28/2018    Social History   Tobacco Use  . Smoking status: Never Smoker  . Smokeless tobacco: Never Used  Substance Use Topics  . Alcohol use: Yes    Comment: Rare    Current Outpatient Medications:  .  famotidine (PEPCID) 20 MG tablet, Take 1 tablet (20 mg total) by mouth 2 (two) times daily., Disp: 60 tablet, Rfl: 6 .  medroxyPROGESTERone (DEPO-PROVERA) 150 MG/ML injection, Inject 1 mL (150 mg total) into the muscle every 3 (three) months., Disp: 1 mL, Rfl: 4 .  ondansetron (ZOFRAN ODT) 4 MG disintegrating tablet, Take 1 tablet (4 mg total) by mouth every 8 (eight) hours as needed for nausea or  vomiting., Disp: 10 tablet, Rfl: 0 .  predniSONE (STERAPRED UNI-PAK 21 TAB) 10 MG (21) TBPK tablet, As directed, Disp: 21 tablet, Rfl: 0 .  cetirizine (ZYRTEC ALLERGY) 10 MG tablet, Take 1 tablet (10 mg total) by mouth daily. (Patient not taking: Reported on 08/29/2020), Disp: 30 tablet, Rfl: 0 .  fluticasone (FLONASE) 50 MCG/ACT nasal spray, Place 1 spray into both nostrils daily. (Patient not taking: Reported on 08/29/2020), Disp: 16 g, Rfl: 0 .  glycopyrrolate (ROBINUL) 2 MG tablet, Take 1 tablet (2 mg total) by mouth 2 (two) times daily as needed (, Abdominal Cramping/IBS/Diarrhea). (Patient not taking: Reported on 08/29/2020), Disp: 60 tablet, Rfl: 6 .  naproxen (NAPROSYN) 500 MG tablet, Take 1 tablet (500 mg total) by mouth 2 (two) times daily. (Patient not taking: Reported on 08/29/2020), Disp: 30 tablet, Rfl: 0  No Known Allergies  Objective:   Ht 5\' 2"  (1.575 m)   Wt 110 lb (49.9 kg)   BMI 20.12 kg/m   Patient is well-developed, well-nourished in no acute distress.  Resting comfortably at home.  Head is normocephalic, atraumatic.  No labored breathing.  Speech is clear and coherent with logical content.  Patient is alert and oriented at baseline.   Assessment and Plan:   Gail [name] was seen today for covid positive.  Diagnoses and all orders for this visit:  Nasal congestion  COVID-19  Other orders -     predniSONE (STERAPRED UNI-PAK 21 TAB) 10 MG (21) TBPK tablet; As directed      [name], FNP 08/29/2020  Time spent with the patient: minutes, of which >50% was spent in obtaining information about symptoms, reviewing previous labs, evaluations, and treatments, counseling about condition (please see the discussed topics above), and developing a plan to further investigate it; had a number of questions which I addressed.

## 2020-09-03 IMAGING — NM NM HEPATO W/GB/PHARM/[PERSON_NAME]
2 series · 12 of 12 positions shown · non-contrast
Comparison: None

CLINICAL DATA: Nausea, vomiting, reflux, loss of appetite, RIGHT
upper quadrant pain and bloating

EXAM:
NUCLEAR MEDICINE HEPATOBILIARY IMAGING WITH GALLBLADDER EF
TECHNIQUE: Sequential images of the abdomen were obtained [DATE] minutes
following intravenous administration of radiopharmaceutical. After
oral ingestion of Ensure, gallbladder ejection fraction was
determined. At 60 min, normal ejection fraction is greater than 33%.
RADIOPHARMACEUTICALS:  5.4 mCi Mc-SSm  Choletec IV

[Series 1: biliary · 3.25mm/px · 6 of 60 frames shown]
[frame 6/60]
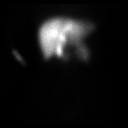
[frame 16/60]
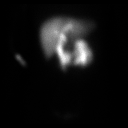
[frame 26/60]
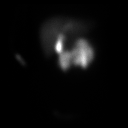
[frame 36/60]
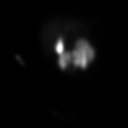
[frame 46/60]
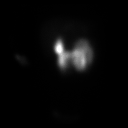
[frame 56/60]
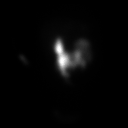

[Series 2: gbef · 3.25mm/px · 6 of 60 frames shown]
[frame 6/60]
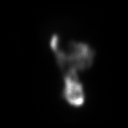
[frame 16/60]
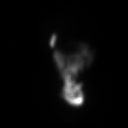
[frame 26/60]
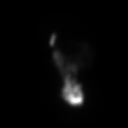
[frame 36/60]
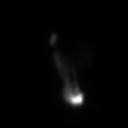
[frame 46/60]
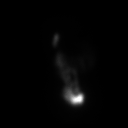
[frame 56/60]
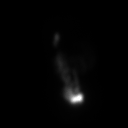

[12 of 12 positions shown; findings below may reference images not displayed]

FINDINGS: Normal tracer extraction from bloodstream indicating normal
hepatocellular function.

Normal excretion of tracer into biliary tree.

Gallbladder visualized at 23 min.

Small bowel visualized at 6 min.

No hepatic retention of tracer.

Subjectively normal emptying of tracer from gallbladder following
fatty meal stimulation.

Calculated gallbladder ejection fraction is 73%, normal.

Normal gallbladder ejection fraction following Ensure ingestion is
greater than 33% at 1 hour.
IMPRESSION: Patent biliary tree with normal gallbladder ejection fraction of 73%
following fatty meal stimulation.

## 2020-09-22 ENCOUNTER — Encounter: Payer: No Typology Code available for payment source | Admitting: Nurse Practitioner

## 2020-10-30 ENCOUNTER — Telehealth (HOSPITAL_COMMUNITY): Payer: Self-pay | Admitting: Psychiatry

## 2020-10-30 NOTE — Telephone Encounter (Signed)
D:  Pt phoned and stated that she is very interested in starting MH-IOP.  Reports that Abel Presto, [facility] referred her.  A:  Oriented pt.  Pt will start on 11-06-20 and case manager will call pt this week to complete the CCA.  R:  Pt receptive.

## 2020-11-01 ENCOUNTER — Other Ambulatory Visit: Payer: Self-pay

## 2020-11-02 ENCOUNTER — Other Ambulatory Visit (HOSPITAL_COMMUNITY): Payer: 59 | Attending: Psychiatry | Admitting: Psychiatry

## 2020-11-02 ENCOUNTER — Ambulatory Visit (INDEPENDENT_AMBULATORY_CARE_PROVIDER_SITE_OTHER): Payer: 59

## 2020-11-02 DIAGNOSIS — Z30013 Encounter for initial prescription of injectable contraceptive: Secondary | ICD-10-CM | POA: Diagnosis not present

## 2020-11-02 DIAGNOSIS — F411 Generalized anxiety disorder: Secondary | ICD-10-CM | POA: Insufficient documentation

## 2020-11-02 DIAGNOSIS — F323 Major depressive disorder, single episode, severe with psychotic features: Secondary | ICD-10-CM | POA: Insufficient documentation

## 2020-11-02 DIAGNOSIS — F209 Schizophrenia, unspecified: Secondary | ICD-10-CM | POA: Insufficient documentation

## 2020-11-02 DIAGNOSIS — Z793 Long term (current) use of hormonal contraceptives: Secondary | ICD-10-CM | POA: Insufficient documentation

## 2020-11-02 MED ORDER — MEDROXYPROGESTERONE ACETATE 150 MG/ML IM SUSP
150.0000 mg | Freq: Once | INTRAMUSCULAR | Status: AC
  Administered 2020-11-02: 150 mg via INTRAMUSCULAR

## 2020-11-02 NOTE — Progress Notes (Signed)
Virtual Visit via Video Note  I connected with Gail [name] [name] on @TODAY @ at  9:00 AM EDT by a video enabled telemedicine application and verified that I am speaking with the correct person using two identifiers.  Location: Patient: at home Provider: at office   I discussed the limitations of evaluation and management by telemedicine and the availability of in person appointments. The patient expressed understanding and agreed to proceed.  I discussed the assessment and treatment plan with the patient. The patient was provided an opportunity to ask questions and all were answered. The patient agreed with the plan and demonstrated an understanding of the instructions.   The patient was advised to call back or seek an in-person evaluation if the symptoms worsen or if the condition fails to improve as anticipated.  I provided 60 minutes of non-face-to-face time during this encounter.   , RITA, M.Ed, CNA   Comprehensive Clinical Assessment (CCA) Note  11/02/2020 Gail [name] Gail [name]  Chief Complaint:  Chief Complaint  Patient presents with  . Anxiety  . Depression   Visit Diagnosis: F33.3   CCA Screening, Triage and Referral (STR)  Patient Reported Information How did you hear about [id number]? Other (Comment)  Referral name: [place] (therapist)  Referral phone number: No data recorded  Whom do you see for routine medical problems? No data recorded Practice/Facility Name: No data recorded Practice/Facility Phone Number: No data recorded Name of Contact: No data recorded Contact Number: No data recorded Contact Fax Number: No data recorded Prescriber Name: No data recorded Prescriber Address (if known): No data recorded  What Is the Reason for Your Visit/Call Today? anxiety and depression  How Long Has This Been Causing You Problems? 1-6 months  What Do You Feel Would Help You the Most Today? Stress Management; Treatment for Depression  or other mood problem   Have You Recently Been in Any Inpatient Treatment (Hospital/Detox/Crisis Center/28-Day Program)? No  Name/Location of Program/Hospital:No data recorded How Long Were You There? No data recorded When Were You Discharged? No data recorded  Have You Ever Received Services From [facility] Before? Yes  [facility]   Have You Recently Had Any Thoughts About Hurting Yourself? No  Are You Planning to Commit Suicide/Harm Yourself At This time? No   Have you Recently Had Thoughts About Hurting Someone 02-24-2000? No  Explanation: No data recorded  Have You Used Any Alcohol or Drugs in the Past 24 Hours? No  How Long Ago Did You Use Drugs or Alcohol? No data recorded What Did You Use and How Much? No data recorded  Do You Currently Have a Therapist/Psychiatrist? Yes  Name of Therapist/Psychiatrist: cc: above   Have You Been Recently Discharged From Any Office Practice or Programs? No  Explanation of Discharge From Practice/Program: No data recorded    CCA Screening Triage Referral Assessment Type of Contact: Face-to-Face  Is this Initial or Reassessment? No data recorded Date Telepsych consult ordered in [facility]:  No data recorded Time Telepsych consult ordered in [facility]:  No data recorded  Patient Reported Information Reviewed? Yes  Patient Left Without Being Seen? No data recorded Reason for Not Completing Assessment: No data recorded  Collateral Involvement: No data recorded  Does Patient Have a Court Appointed Legal Guardian? No data recorded Name and Contact of Legal Guardian: No data recorded If Minor and Not Living with Parent(s), Who has Custody? No data recorded Is CPS involved or ever been involved? Never  Is APS involved or ever been involved? Never   Patient Determined To Be At Risk for Harm To Self or Others Based on Review of Patient Reported Information or Presenting Complaint? No  Method: No data  recorded Availability of Means: No data recorded Intent: No data recorded Notification Required: No data recorded Additional Information for Danger to Others Potential: No data recorded Additional Comments for Danger to Others Potential: No data recorded Are There Guns or Other Weapons in Your Home? No data recorded Types of Guns/Weapons: No data recorded Are These Weapons Safely Secured?                            No data recorded Who Could Verify You Are Able To Have These Secured: No data recorded Do You Have any Outstanding Charges, Pending Court Dates, Parole/Probation? No data recorded Contacted To Inform of Risk of Harm To Self or Others: No data recorded  Location of Assessment: No data recorded  Does Patient Present under Involuntary Commitment? No  IVC Papers Initial File Date: No data recorded  [place]   Patient Currently Receiving the Following Services: IOP (Intensive Outpatient Program)   Determination of Need: Routine (7 days)   Options For Referral: Intensive Outpatient Therapy     CCA Biopsychosocial Intake/Chief Complaint:  This is a 20 yr old, single female who was referred per Abel Prestoonna Hood, [facility]; treatment for worsening depressive and anxiety symptoms.  Pt denies SI/HI and V/Hallucinations.  Pt admits to A/Hallucinations.  Pt reports [facility]  Pt states her sx's started to worsen a couple of months ago.  "I stress over everything."  Pt has been struggling with anxiety thus making the A/Hallucinations worsen at times.  "I hear someone calling my name, but I know that there's no one calling me and that it's in my head."  Pt admits to previously taking various meds but states they always made her feel worse.  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  States that the amount of courses she is taking and the amount of time she spends on each class is very taxing.  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  pt works Wed-Sun (9pm-8 am.).  Pt states her mother, sister and friends are her support system.  Current Symptoms/Problems: Sadness, very anxious, isolative, poor concentration, anhedonia, low energy, no motivation, tearfulness, racing thoughts, increased sleep, decreased appetite, A/hallucinations ("I hear someone calling my name")  Denies commanding voices."   Patient Reported Schizophrenia/Schizoaffective Diagnosis in Past: Yes ([facility] as per pt states)   Strengths: "I am funny."  Preferences: "I need to work on increasing my self-esteem"  Abilities: Speaks English very well.   Type of Services Patient Feels are Needed: MH-IOP   Initial Clinical Notes/Concerns: Work schedule with attending MH-IOP and school on top of this may add more stress.   Mental Health Symptoms Depression:  Change in energy/activity; Difficulty Concentrating; Fatigue; Increase/decrease in appetite; Sleep (too much or little); Tearfulness; Worthlessness   Duration of Depressive symptoms: Greater than two weeks   Mania:  N/A   Anxiety:   Worrying   Psychosis:  Hallucinations (Auditory hallucinations ("I hear someone calling my name"))   Duration of Psychotic symptoms: Less than six months   Trauma:  N/A   Obsessions:  N/A   Compulsions:  N/A   Inattention:  N/A   Hyperactivity/Impulsivity:  N/A   Oppositional/Defiant Behaviors:  N/A   Emotional Irregularity:  N/A   Other Mood/Personality Symptoms:  No data recorded   Mental Status Exam Appearance and self-care  Stature:  Average   Weight:  Average weight   Clothing:  Casual   Grooming:  Normal   Cosmetic use:  Age appropriate   Posture/gait:  Normal   Motor activity:  Not Remarkable   Sensorium  Attention:  Normal   Concentration:  Scattered; Variable   Orientation:  X5   Recall/memory:  Normal   Affect and Mood  Affect:  Anxious    Mood:  Depressed   Relating  Eye contact:  Normal   Facial expression:  Anxious   Attitude toward examiner:  Cooperative   Thought and Language  Speech flow: Normal   Thought content:  Appropriate to Mood and Circumstances   Preoccupation:  None   Hallucinations:  Auditory   Organization:  No data recorded  [employer] of Knowledge:  Average   Intelligence:  Average   Abstraction:  Concrete   Judgement:  Fair   Reality Testing:  Adequate   Insight:  Gaps   Decision Making:  Vacilates   Social Functioning  Social Maturity:  Isolates   Social Judgement:  Normal   Stress  Stressors:  School; Work   Coping Ability:  Human resources officer Deficits:  Building services engineer; Interpersonal; Self-care   Supports:  Family; Friends/Service system (mom, sister, friends, a Nurse, mental health)     Religion: Religion/Spirituality Are You A Religious Person?: Yes What is Your Religious Affiliation?: Catholic  Leisure/Recreation: Leisure / Recreation Do You Have Hobbies?: Yes Leisure and Hobbies: reading, drawing, watch tv, play volleyball  Exercise/Diet: Exercise/Diet Do You Exercise?: No Have You Gained or Lost A Significant Amount of Weight in the Past Six Months?: No Do You Follow a Special Diet?: No Do You Have Any Trouble Sleeping?: No (increased sleep (more than 8 hrs))   CCA Employment/Education Employment/Work Situation: Employment / Work Situation Employment situation: Employed 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 How long has patient been employed?: 9 months Patient's job has been impacted by current illness: Yes Describe how patient's job has been impacted: "I am slower." Has patient ever been in the [employer]?: No  Education: Education Is Patient Currently Attending School?: Yes School Currently Attending: [employer] Did Garment/textile technologist From [employer]?: Yes Did Theme park manager?: Yes What Type of College Degree Do you Have?:  currently a sophomore at [employer] Did [place] Attend Graduate School?: No What Was Your Major?: Social Work Did You Have An Individualized Education Program (IIEP): No Did You Have Any Difficulty At [employer]?: No Patient's Education Has Been Impacted by Current Illness: Yes How Does Current Illness Impact Education?: "worried that her grades may go down."   CCA Family/Childhood History Family and Relationship History: Family history Marital status: Single Are you sexually active?: No What is your sexual orientation?: bi-sexual Does patient have children?: No  Childhood History:  Childhood History By whom was/is the patient raised?: Mother Additional childhood history information: [place] when she turned 20 yrs old she moved to [place] with her mother.  "My childhood was pretty good."  Denies any trauma or abuse.  States high school was stressful b/c she didn't like it. Description of patient's relationship with caregiver when they were a child: Pt was very close to mother then and now. Does patient have  siblings?: Yes Number of Siblings: 1 Description of patient's current relationship with siblings: Older sister who resides in [place], [place].  Close to sister. Did patient suffer any verbal/emotional/physical/sexual abuse as a child?: No Did patient suffer from severe childhood neglect?: No Has patient ever been sexually abused/assaulted/raped as an adolescent or adult?: No Was the patient ever a victim of a crime or a disaster?: No Witnessed domestic violence?: No Has patient been affected by domestic violence as an adult?: No  Child/Adolescent Assessment:     CCA Substance Use Alcohol/Drug Use: Alcohol / Drug Use Pain Medications: N/A Prescriptions: N/A Over the Counter: cc: MAR History of alcohol / drug use?: No history of alcohol / drug abuse                         ASAM's:  Six Dimensions of Multidimensional Assessment  Dimension 1:  Acute Intoxication and/or  Withdrawal Potential:      Dimension 2:  Biomedical Conditions and Complications:      Dimension 3:  Emotional, Behavioral, or Cognitive Conditions and Complications:     Dimension 4:  Readiness to Change:     Dimension 5:  Relapse, Continued use, or Continued Problem Potential:     Dimension 6:  Recovery/Living Environment:     ASAM Severity Score:    ASAM Recommended Level of Treatment:     Substance use Disorder (SUD)    Recommendations for Services/Supports/Treatments:    DSM5 Diagnoses: Patient Active Problem List   Diagnosis Date Noted  . Initiation of Depo Provera 09/24/2018  . MDD (major depressive disorder), single episode, severe with psychosis (HCC) 05/03/2018  . Severe major depression with psychotic features, mood-congruent (HCC) 04/28/2018  . Suicide ideation 04/28/2018  . GAD (generalized anxiety disorder) 04/28/2018    Patient Centered Plan: Patient is on the following Treatment Plan(s):  Anxiety and Depression Oriented pt to virtual MH-IOP.  Pt gave verbal consent for treatment, to release chart information to referred providers and to complete any forms if needed.  Pt also gave consent for attending group virtually d/t COVID-19 social distancing restrictions.  Encouraged support groups.  F/U with Abel Presto, [facility].  Will refer pt to a psychiatrist.  Referrals to Alternative Service(s): Referred to Alternative Service(s):   Place:   Date:   Time:    Referred to Alternative Service(s):   Place:   Date:   Time:    Referred to Alternative Service(s):   Place:   Date:   Time:    Referred to Alternative Service(s):   Place:   Date:   Time:     Jeri Modena, M.Ed,CNA

## 2020-11-02 NOTE — Progress Notes (Signed)
Per order from [place] NP; administered Depo Injection 150mg /ml Given my , RN in left vetrogluteal, was tolerated well by patient, No reaction.

## 2020-11-06 ENCOUNTER — Other Ambulatory Visit (HOSPITAL_COMMUNITY): Payer: 59 | Admitting: Family

## 2020-11-06 ENCOUNTER — Encounter (HOSPITAL_COMMUNITY): Payer: Self-pay | Admitting: Psychiatry

## 2020-11-06 ENCOUNTER — Other Ambulatory Visit: Payer: Self-pay

## 2020-11-06 DIAGNOSIS — F323 Major depressive disorder, single episode, severe with psychotic features: Secondary | ICD-10-CM

## 2020-11-06 DIAGNOSIS — F411 Generalized anxiety disorder: Secondary | ICD-10-CM | POA: Diagnosis not present

## 2020-11-06 DIAGNOSIS — Z793 Long term (current) use of hormonal contraceptives: Secondary | ICD-10-CM | POA: Diagnosis not present

## 2020-11-06 MED ORDER — ESCITALOPRAM OXALATE 10 MG PO TABS: 10.0000 mg | ORAL_TABLET | Freq: Every day | ORAL | 2 refills | Status: DC

## 2020-11-06 MED ORDER — ESCITALOPRAM OXALATE 10 MG PO TABS
10.0000 mg | ORAL_TABLET | Freq: Every day | ORAL | 2 refills | Status: DC
Start: 2020-11-06 — End: 2020-11-06

## 2020-11-06 MED ORDER — ARIPIPRAZOLE 5 MG PO TABS: 5.0000 mg | ORAL_TABLET | Freq: Every day | ORAL | 0 refills | Status: DC

## 2020-11-06 MED ORDER — HYDROXYZINE PAMOATE 25 MG PO CAPS: 25.0000 mg | ORAL_CAPSULE | Freq: Three times a day (TID) | ORAL | 0 refills | Status: DC | PRN

## 2020-11-06 MED ORDER — ARIPIPRAZOLE 5 MG PO TABS
5.0000 mg | ORAL_TABLET | Freq: Every day | ORAL | 0 refills | Status: DC
Start: 2020-11-06 — End: 2020-11-06

## 2020-11-06 NOTE — Progress Notes (Signed)
Virtual Visit via Video Note  I connected with Gail [name] on 11/06/20 at  9:00 AM EDT by a video enabled telemedicine application and verified that I am speaking with the correct person using two identifiers.  At orientation to the IOP program, Case Manager discussed the limitations of evaluation and management by telemedicine and the availability of in person appointments. The patient expressed understanding and agreed to proceed with virtual visits throughout the duration of the program.   Location:  Patient: Patient Home Provider: Home Office   History of Present Illness: MDD  Observations/Objective: Check In: Case Manager checked in with all participants to review discharge dates, insurance authorizations, work-related documents and needs from the treatment team regarding medications. Client stated needs and engaged in discussion. Case Manager introduced a new Client to the group, with group members making introductions and starting the joining process.   Initial Therapeutic Activity: Counselor facilitated therapeutic processing with group members to assess mood and current functioning, prompting group members to share about application of skills, progress and challenges in treatment/personal lives. Client is new to the group today, sharing about her life stressors, support system and history of mental health treatment. Client presents with moderate depression and moderate anxiety. Client denied any current SI/HI/psychosis.  Second Therapeutic Activity: Counselor facilitated the creation of a Mental Health Safety Plan with the Group. Client walked group through the various sections, prompting them to customize and make it their own, including coping skills, triggers, supports, professionals and motivations to live. Group members shared their individual results. Client engaged well in the activity.   Check Out: Counselor prompted each group member to share their plans for the  day/week and to discuss any pending concerns. Client stated they are looking forward to returning to group on tomorrow. Client endorsed safety plan to be followed to prevent safety issues.   Assessment and Plan: Clinician recommends that Client remain in IOP treatment to better manage mental health symptoms, stabilization and to address treatment plan goals. Clinician recommends adherence to crisis/safety plan, taking medications as prescribed, and following up with medical professionals if any issues arise.   Follow Up Instructions: Clinician will send Webex link for next session. The Client was advised to call back or seek an in-person evaluation if the symptoms worsen or if the condition fails to improve as anticipated.     I provided 180 minutes of non-face-to-face time during this encounter.     Hilbert Odor, LCSW

## 2020-11-06 NOTE — Progress Notes (Addendum)
I reviewed chart and agreed with the findings and treatment Plan of the patient.  Gail Sharper, MD   Virtual Visit via Video Note  I connected with [name] on 11/06/20 at  9:00 AM EDT by a video enabled telemedicine application and verified that I am speaking with the correct person using two identifiers.  Location: Patient: Home  Provider:  Office   I discussed the limitations of evaluation and management by telemedicine and the availability of in person appointments. The patient expressed understanding and agreed to proceed.   I discussed the assessment and treatment plan with the patient. The patient was provided an opportunity to ask questions and all were answered. The patient agreed with the plan and demonstrated an understanding of the instructions.   The patient was advised to call back or seek an in-person evaluation if the symptoms worsen or if the condition fails to improve as anticipated.  I provided 15 minutes of non-face-to-face time during this encounter.   [name], NP    Psychiatric Initial Adult Assessment   Patient Identification: Gail [name] MRN:  [id number] Date of Evaluation:  11/06/2020 Referral Source:  Chief Complaint:   Chief Complaint     Depression; Anxiety; Stress      Visit Diagnosis: No diagnosis found.  History of Present Illness:  Gail Tolentiono - Jayme [name] is a 20 year old female presents after a referral by he therapist. Patient reported history with depression, generalized anxiety disorder and schizophrenia.  Reports she was followed by psychiatry in the past however has not been consistent with outpatient follow-up.  Reports she was prescribed Lexapro and trazodone for mood stabilization.  Discussed restarting medication and will initiate Abilify due to auditory hallucination.  Patient denied illicit drug use or substance abuse history.  Ludy stated she is unsure of family history with mental  illness.  Reported previous inpatient admissions (2018).  Denied self injures behaviors.  Denied history of physical or sexual abuse in the past.  Reports current stressors as school and work environment.  Denying suicidal or homicidal ideations currently.  Reports intermittent auditory hallucinations.  Patient to start intensive outpatient programming on 11/06/2020  Associated Signs/Symptoms: Depression Symptoms:  depressed mood, feelings of worthlessness/guilt, difficulty concentrating, anxiety, (Hypo) Manic Symptoms:  Distractibility, Irritable Mood, Anxiety Symptoms:  Excessive Worry, Psychotic Symptoms:  Hallucinations: None PTSD Symptoms: Avoidance:  Decreased Interest/Participation  Past Psychiatric History:   Previous Psychotropic Medications: Yes   Substance Abuse History in the last 12 months:  No.  Consequences of Substance Abuse: NA  Past Medical History:  Past Medical History:  Diagnosis Date   Anxiety    Depression    Medical history non-contributory    Vision abnormalities     Past Surgical History:  Procedure Laterality Date   WISDOM TOOTH EXTRACTION      Family Psychiatric History:   Family History:  Family History  Problem Relation Age of Onset   Rheum arthritis Mother    Lupus Mother    Obesity Mother    Hypertension Mother    Kidney disease Mother    Healthy Sister    Breast cancer Maternal Grandmother    Cancer Maternal Grandmother        breast cancer   Diabetes Maternal Grandmother    Hypertension Maternal Grandmother    Hyperlipidemia Maternal Grandmother    Breast cancer Cousin    Diabetes Father     Social History:   Social History   Socioeconomic History  Marital status: Single    Spouse name: Not on file   Number of children: Not on file   Years of education: Not on file   Highest education level: Not on file  Occupational History   Not on file  Tobacco Use   Smoking status: Never Smoker   Smokeless tobacco: Never Used   Vaping Use   Vaping Use: Never used  Substance and Sexual Activity   Alcohol use: Yes    Comment: Rare   Drug use: Never   Sexual activity: Not Currently    Birth control/protection: Injection    Comment: 1st intercourse- 16, partners- 2,   Other Topics Concern   Not on file  Social History Narrative   Not on file   Social Determinants of Health   Financial Resource Strain: Not on file  Food Insecurity: Not on file  Transportation Needs: Not on file  Physical Activity: Not on file  Stress: Not on file  Social Connections: Not on file    Additional Social History:   Allergies:  No Known Allergies  Metabolic Disorder Labs: Lab Results  Component Value Date   HGBA1C 4.8 04/28/2018   MPG 91 04/28/2018   Lab Results  Component Value Date   PROLACTIN 74.5 (H) 04/28/2018   Lab Results  Component Value Date   CHOL 125 04/28/2018   TRIG 58 04/28/2018   HDL 51 04/28/2018   CHOLHDL 2.5 04/28/2018   VLDL 12 04/28/2018   LDLCALC 62 04/28/2018   Lab Results  Component Value Date   TSH 1.611 04/28/2018    Therapeutic Level Labs: No results found for: LITHIUM No results found for: CBMZ No results found for: VALPROATE  Current Medications: Current Outpatient Medications  Medication Sig Dispense Refill   famotidine (PEPCID) 20 MG tablet Take 1 tablet (20 mg total) by mouth 2 (two) times daily. 60 tablet 6   medroxyPROGESTERone (DEPO-PROVERA) 150 MG/ML injection Inject 1 mL (150 mg total) into the muscle every 3 (three) months. 1 mL 4   cetirizine (ZYRTEC ALLERGY) 10 MG tablet Take 1 tablet (10 mg total) by mouth daily. (Patient not taking: Reported on 11/06/2020) 30 tablet 0   fluticasone (FLONASE) 50 MCG/ACT nasal spray Place 1 spray into both nostrils daily. (Patient not taking: Reported on 11/06/2020) 16 g 0   glycopyrrolate (ROBINUL) 2 MG tablet Take 1 tablet (2 mg total) by mouth 2 (two) times daily as needed (, Abdominal Cramping/IBS/Diarrhea). (Patient not taking:  Reported on 11/06/2020) 60 tablet 6   naproxen (NAPROSYN) 500 MG tablet Take 1 tablet (500 mg total) by mouth 2 (two) times daily. (Patient not taking: Reported on 11/06/2020) 30 tablet 0   ondansetron (ZOFRAN ODT) 4 MG disintegrating tablet Take 1 tablet (4 mg total) by mouth every 8 (eight) hours as needed for nausea or vomiting. (Patient not taking: Reported on 11/06/2020) 10 tablet 0   predniSONE (STERAPRED UNI-PAK 21 TAB) 10 MG (21) TBPK tablet As directed (Patient not taking: Reported on 11/06/2020) 21 tablet 0   No current facility-administered medications for this visit.    Musculoskeletal: Strength & Muscle Tone: within normal limits Gait & Station: normal Patient leans: N/A  Psychiatric Specialty Exam: Review of Systems  There were no vitals taken for this visit.There is no height or weight on file to calculate BMI.  General Appearance: Casual  Eye Contact:  Good  Speech:  Clear and Coherent  Volume:  Normal  Mood:  Anxious and Depressed  Affect:  Congruent  Thought Process:  Coherent  Orientation:  Full (Time, Place, and Person)  Thought Content:  Logical  Suicidal Thoughts:  No  Homicidal Thoughts:  No  Memory:  Immediate;   Fair Recent;   Fair  Judgement:  Fair  Insight:  Good  Psychomotor Activity:  Normal  Concentration:  Concentration: Fair  Recall:  Good  Fund of Knowledge:Good  Language: Good  Akathisia:  No  Handed:  Right  AIMS (if indicated):  Assets:  Communication Skills Resilience Social Support  ADL's:  Intact  Cognition: WNL  Sleep:  Fair   Screenings: AIMS    Flowsheet Row Admission (Discharged) [facility]  AIMS Total Score 0      GAD-7    Flowsheet Row Office Visit from 06/07/2019 in [facility] Office Visit from 09/23/2018 in [facility] at [facility]  Total GAD-7 Score 14 6      PHQ2-9    Flowsheet Row Counselor from 11/06/2020 in  [facility] Office Visit from 06/07/2019 in [facility] Office Visit from 09/23/2018 in [facility] at [facility] Total Score 6 3 2   PHQ-9 Total Score 22 16 7       Flowsheet Row Counselor from 11/06/2020 in [facility] Admission (Discharged) from OP [facility]  C-SSRS RISK CATEGORY Error: Question 6 not populated High Risk       Assessment and Plan:  Patient to start intensive outpatient programming (IOP) Restarted Lexapro 10mg  dail, Abilify 5mg  and Hydroxyzine 25mg  po TID PRN   Treatment plan was reviewed and agreed upon by NPT Urho Rio and patient 11/08/2020 need for group services    06/28/2018, NP 3/21/202211:28 AM

## 2020-11-07 ENCOUNTER — Other Ambulatory Visit (HOSPITAL_COMMUNITY): Payer: 59 | Admitting: Psychiatry

## 2020-11-07 ENCOUNTER — Other Ambulatory Visit: Payer: Self-pay

## 2020-11-07 ENCOUNTER — Encounter (HOSPITAL_COMMUNITY): Payer: Self-pay

## 2020-11-07 DIAGNOSIS — F323 Major depressive disorder, single episode, severe with psychotic features: Secondary | ICD-10-CM | POA: Diagnosis not present

## 2020-11-07 NOTE — Progress Notes (Signed)
Virtual Visit via Video Note  I connected with Gail [name] on 11/07/20 at  9:00 AM EDT by a video enabled telemedicine application and verified that I am speaking with the correct person using two identifiers.  At orientation to the IOP program, Case Manager discussed the limitations of evaluation and management by telemedicine and the availability of in person appointments. The patient expressed understanding and agreed to proceed with virtual visits throughout the duration of the program.   Location:  Patient: Patient Home Provider: Home Office   History of Present Illness: MDD  Observations/Objective: Check In: Case Manager checked in with all participants to review discharge dates, insurance authorizations, work-related documents and needs from the treatment team regarding medications. Client stated needs and engaged in discussion. Case Manager introduced a new Client to the group, with group members making introductions and starting the joining process.  Initial Therapeutic Activity: Counselor facilitated therapeutic processing with group members to assess mood and current functioning, prompting group members to share about application of skills, progress and challenges in treatment/personal lives. Client provided updates and chose specific issues to process and receive feedback on strategies. Client presents with moderate depression and moderate anxiety. Client denied any current SI/HI/psychosis.  Second Therapeutic Activity: Counselor facilitated the creation of a Mental Health Safety Plan with the Group. Client walked group through the various sections, prompting them to customize and make it their own, including coping skills, triggers, supports, professionals and motivations to live. Client identified who they will share their plan with and their ideas for implementation. Client engaged well in the activity.   Check Out: Counselor closed program by allowing time to  celebrate a graduating group member. Counselor shared reflections on progress and allow space for group members to share well wishes and appreciates to the graduating client. Counselor prompted graduating client to share takeaways, reflect on progress and final thoughts for the group. Client endorsed safety plan to be followed to prevent safety issues.   Assessment and Plan: Clinician recommends that Client remain in IOP treatment to better manage mental health symptoms, stabilization and to address treatment plan goals. Clinician recommends adherence to crisis/safety plan, taking medications as prescribed, and following up with medical professionals if any issues arise.   Follow Up Instructions: Clinician will send Webex link for next session. The Client was advised to call back or seek an in-person evaluation if the symptoms worsen or if the condition fails to improve as anticipated.     I provided 180 minutes of non-face-to-face time during this encounter.     Hilbert Odor, LCSW

## 2020-11-08 ENCOUNTER — Other Ambulatory Visit (HOSPITAL_COMMUNITY): Payer: 59 | Admitting: Psychiatry

## 2020-11-08 ENCOUNTER — Ambulatory Visit: Payer: No Typology Code available for payment source | Admitting: Nurse Practitioner

## 2020-11-08 ENCOUNTER — Encounter (HOSPITAL_COMMUNITY): Payer: Self-pay

## 2020-11-08 ENCOUNTER — Other Ambulatory Visit: Payer: Self-pay

## 2020-11-08 DIAGNOSIS — F323 Major depressive disorder, single episode, severe with psychotic features: Secondary | ICD-10-CM

## 2020-11-08 NOTE — Progress Notes (Signed)
Virtual Visit via Video Note  I connected with Gail [name] on 11/08/20 at  9:00 AM EDT by a video enabled telemedicine application and verified that I am speaking with the correct person using two identifiers.  At orientation to the IOP program, Case Manager discussed the limitations of evaluation and management by telemedicine and the availability of in person appointments. The patient expressed understanding and agreed to proceed with virtual visits throughout the duration of the program.   Location:  Patient: Patient Home Provider: Home Office   History of Present Illness: MDD  Observations/Objective: Check In: Case Manager checked in with all participants to review discharge dates, insurance authorizations, work-related documents and needs from the treatment team regarding medications. Client stated needs and engaged in discussion.   Initial Therapeutic Activity: Counselor introduced our guest speaker, Peggye Fothergill, [facility] Pharmacist, who shared about psychiatric medications, side effects, treatment considerations and how to communicate with medical professionals. Group Members asked questions and shared medication concerns. Counselor prompted group members to reference a worksheet called, "Body Scan" to jot down questions and concerns about their physical health in preparation for their upcoming appointments with medical professionals. Client identified that she needs to follow up with primary provider, optometrist, check headaches and assess hair issues. Counselor encouraged routine medical check-ups, preparing for appointments, following up with recommendations and seeking specialist if needed.    Second Therapeutic Activity: Counselor facilitated therapeutic processing with group members to assess mood and current functioning, prompting group members to share about application of skills, progress and challenges in treatment/personal lives. Client provided updates and chose  specific issues to process and receive feedback on strategies. Client presents with moderate depression and moderate anxiety. Client denied any current SI/HI/psychosis.   Check Out: Counselor closed program by allowing time to celebrate a graduating group member. Counselor shared reflections on progress and allow space for group members to share well wishes and appreciates to the graduating client. Counselor prompted graduating client to share takeaways, reflect on progress and final thoughts for the group. Client endorsed safety plan to be followed to prevent safety issues.   Assessment and Plan: Clinician recommends that Client remain in IOP treatment to better manage mental health symptoms, stabilization and to address treatment plan goals. Clinician recommends adherence to crisis/safety plan, taking medications as prescribed, and following up with medical professionals if any issues arise.   Follow Up Instructions: Clinician will send Webex link for next session. The Client was advised to call back or seek an in-person evaluation if the symptoms worsen or if the condition fails to improve as anticipated.     I provided 180 minutes of non-face-to-face time during this encounter.     Hilbert Odor, LCSW

## 2020-11-09 ENCOUNTER — Other Ambulatory Visit (HOSPITAL_COMMUNITY): Payer: 59 | Admitting: Psychiatry

## 2020-11-09 ENCOUNTER — Other Ambulatory Visit: Payer: Self-pay

## 2020-11-09 ENCOUNTER — Encounter (HOSPITAL_COMMUNITY): Payer: Self-pay

## 2020-11-09 DIAGNOSIS — F323 Major depressive disorder, single episode, severe with psychotic features: Secondary | ICD-10-CM | POA: Diagnosis not present

## 2020-11-09 NOTE — Progress Notes (Signed)
Virtual Visit via Video Note  I connected with Gail [name] on 11/09/20 at  9:00 AM EDT by a video enabled telemedicine application and verified that I am speaking with the correct person using two identifiers.  At orientation to the IOP program, Case Manager discussed the limitations of evaluation and management by telemedicine and the availability of in person appointments. The patient expressed understanding and agreed to proceed with virtual visits throughout the duration of the program.   Location:  Patient: Patient Home Provider: Home Office   History of Present Illness: MDD  Observations/Objective: Check In: Case Manager checked in with all participants to review discharge dates, insurance authorizations, work-related documents and needs from the treatment team regarding medications. Client stated needs and engaged in discussion.   Initial Therapeutic Activity: Counselor introduced Sheppard Coil, [name] Chaplain to present information and discussion on Grief and Loss. Group members engaged in discussion, sharing how grief impacts them, what comforts them, what emotions are felt, labeling losses, etc. After guest speaker logged off, Counselor prompted group to spend 10-15 minutes journaling to process personal grief and loss situations. Counselor processed entries with group and client's identified areas for additional processing in individual therapy. Client shared about loss of step father and lasting impact on her family.   Second Therapeutic Activity: Counselor engaged the group in a guided Medical laboratory scientific officer, called Finding Your Authentic Self. The practice incorporated deep breathing, progressive muscle relaxation, positive affirmations and self-exploration. Group members followed along with the prompts and shared feedback on their experiences, ranging from relaxed, to distracted, to at peace, and interested in engaging in practices like this in the future for a variety  of benefits.    Check Out: Client shared current progress and potential topics for future sessions. Client endorsed safety plan to be followed to prevent safety issues. Client presents with moderate depression and moderate anxiety. Client denied any current SI/HI/psychosis.  Assessment and Plan: Clinician recommends that Client remain in IOP treatment to better manage mental health symptoms, stabilization and to address treatment plan goals. Clinician recommends adherence to crisis/safety plan, taking medications as prescribed, and following up with medical professionals if any issues arise.   Follow Up Instructions: Clinician will send Webex link for next session. The Client was advised to call back or seek an in-person evaluation if the symptoms worsen or if the condition fails to improve as anticipated.     I provided 180 minutes of non-face-to-face time during this encounter.     Hilbert Odor, LCSW

## 2020-11-10 ENCOUNTER — Other Ambulatory Visit: Payer: Self-pay

## 2020-11-10 ENCOUNTER — Other Ambulatory Visit (HOSPITAL_COMMUNITY): Payer: 59 | Admitting: Psychiatry

## 2020-11-10 DIAGNOSIS — F323 Major depressive disorder, single episode, severe with psychotic features: Secondary | ICD-10-CM | POA: Diagnosis not present

## 2020-11-13 ENCOUNTER — Other Ambulatory Visit: Payer: Self-pay

## 2020-11-13 ENCOUNTER — Encounter (HOSPITAL_COMMUNITY): Payer: Self-pay

## 2020-11-13 ENCOUNTER — Other Ambulatory Visit (HOSPITAL_COMMUNITY): Payer: 59 | Admitting: Psychiatry

## 2020-11-13 DIAGNOSIS — F323 Major depressive disorder, single episode, severe with psychotic features: Secondary | ICD-10-CM | POA: Diagnosis not present

## 2020-11-13 NOTE — Progress Notes (Signed)
Virtual Visit via Video Note  I connected with Gail [name] on 11/13/20 at  9:00 AM EDT by a video enabled telemedicine application and verified that I am speaking with the correct person using two identifiers.  At orientation to the IOP program, Case Manager discussed the limitations of evaluation and management by telemedicine and the availability of in person appointments. The patient expressed understanding and agreed to proceed with virtual visits throughout the duration of the program.   Location:  Patient: Patient Home Provider: Home Office   History of Present Illness: MDD  Observations/Objective: Check In: Case Manager checked in with all participants to review discharge dates, insurance authorizations, work-related documents and needs from the treatment team regarding medications. Client stated needs and engaged in discussion.   Initial Therapeutic Activity: Occupational Therapist [name] presented information on communication skill building. Presenter taught the skill of using and applying "I" statements in when addressing issues in relationships. Group members engaged in discussion and participated in activities, reporting out their work. Client intends to utilize skill in future conversations to express perspective an needs.   Second Therapeutic Activity: Counselor facilitated therapeutic processing with group members to assess mood and current functioning, prompting group members to share about application of skills, progress and challenges in treatment/personal lives. Client shared positive outcomes from skills application and shared about a challenge, receiving feedback from group members on strategies to address. Client presents with moderate depression and moderate anxiety. Client denied any current SI/HI/psychosis.   Check Out: Counselor closed program by allowing time to celebrate a graduating group member. Counselor shared reflections on progress and  allow space for group members to share well wishes and appreciates to the graduating client. Counselor prompted graduating client to share takeaways, reflect on progress and final thoughts for the group. Client endorsed safety plan to be followed to prevent safety issues.   Assessment and Plan: Clinician recommends that Client remain in IOP treatment to better manage mental health symptoms, stabilization and to address treatment plan goals. Clinician recommends adherence to crisis/safety plan, taking medications as prescribed, and following up with medical professionals if any issues arise.   Follow Up Instructions: Clinician will send Webex link for next session. The Client was advised to call back or seek an in-person evaluation if the symptoms worsen or if the condition fails to improve as anticipated.     I provided 180 minutes of non-face-to-face time during this encounter.     Hilbert Odor, LCSW

## 2020-11-13 NOTE — Progress Notes (Signed)
Virtual Visit via Video Note  I connected with Gail [name] on 11/13/20 at  9:00 AM EDT by a video enabled telemedicine application and verified that I am speaking with the correct person using two identifiers.  At orientation to the IOP program, Case Manager discussed the limitations of evaluation and management by telemedicine and the availability of in person appointments. The patient expressed understanding and agreed to proceed with virtual visits throughout the duration of the program.   Location:  Patient: Patient Home Provider: Home Office   History of Present Illness: MDD  Observations/Objective: Check In: Case Manager checked in with all participants to review discharge dates, insurance authorizations, work-related documents and needs from the treatment team regarding medications. Client stated needs and engaged in discussion.   Initial Therapeutic Activity: Counselor facilitated therapeutic processing with group members to assess mood and current functioning, prompting group members to share about application of skills, progress and challenges in treatment/personal lives. Client shared positive outcomes from skills application and shared about a challenge, receiving feedback from group members on strategies to address. Client presents with moderate depression and mild anxiety. Client denied any current SI/HI/psychosis.   Second Therapeutic Activity: Counselor shared psychoeducation on Cognitive Distortions and completed activities on cognitive restructuring. Counselor prompted group to take note and identify which cognitive distortions they most resonate with. Counselor provided group with additional worksheets for vocabulary and cognitive coping skills to combat cognitive distortions in everyday situations. Client engaged well in activity and plans to address negative and distorted thoughts more directly.    Check Out: Counselor prompted group members to share their  plans for self-care or productivity activities they will engage in today. Client stated that they plan to finish organizing their room and do laundry. Client endorsed safety plan to be followed to prevent safety issues.   Assessment and Plan: Clinician recommends that Client remain in IOP treatment to better manage mental health symptoms, stabilization and to address treatment plan goals. Clinician recommends adherence to crisis/safety plan, taking medications as prescribed, and following up with medical professionals if any issues arise.   Follow Up Instructions: Clinician will send Webex link for next session. The Client was advised to call back or seek an in-person evaluation if the symptoms worsen or if the condition fails to improve as anticipated.     I provided 180 minutes of non-face-to-face time during this encounter.     Hilbert Odor, LCSW

## 2020-11-14 ENCOUNTER — Other Ambulatory Visit (HOSPITAL_COMMUNITY): Payer: 59 | Admitting: Psychiatry

## 2020-11-14 ENCOUNTER — Other Ambulatory Visit: Payer: Self-pay

## 2020-11-14 DIAGNOSIS — F323 Major depressive disorder, single episode, severe with psychotic features: Secondary | ICD-10-CM | POA: Diagnosis not present

## 2020-11-15 ENCOUNTER — Encounter (HOSPITAL_COMMUNITY): Payer: Self-pay

## 2020-11-15 ENCOUNTER — Other Ambulatory Visit (HOSPITAL_BASED_OUTPATIENT_CLINIC_OR_DEPARTMENT_OTHER): Payer: 59 | Admitting: Psychiatry

## 2020-11-15 ENCOUNTER — Other Ambulatory Visit: Payer: Self-pay

## 2020-11-15 ENCOUNTER — Encounter (HOSPITAL_COMMUNITY): Payer: Self-pay | Admitting: Psychiatry

## 2020-11-15 DIAGNOSIS — F323 Major depressive disorder, single episode, severe with psychotic features: Secondary | ICD-10-CM | POA: Diagnosis not present

## 2020-11-15 NOTE — Progress Notes (Signed)
Virtual Visit via Video Note  I connected with Cena Bruhn [name] on 11/15/20 at  9:00 AM EDT by a video enabled telemedicine application and verified that I am speaking with the correct person using two identifiers.  Location: Patient: Home Provider: Home Office    I discussed the limitations of evaluation and management by telemedicine and the availability of in person appointments. The patient expressed understanding and agreed to proceed.    I discussed the assessment and treatment plan with the patient. The patient was provided an opportunity to ask questions and all were answered. The patient agreed with the plan and demonstrated an understanding of the instructions.   The patient was advised to call back or seek an in-person evaluation if the symptoms worsen or if the condition fails to improve as anticipated.  I provided 15 minutes of non-face-to-face time during this encounter.   [name], NP   [facility] MD/PA/NP OP Progress Note  11/15/2020 10:13 AM Rhapsody Wolven [name]  MRN:  [id number]   Kaelei was seen evaluated via teleassessment.  She was initiated on Abilify, Lexapro and hydroxyzine for mood stabilization.  As she reported during her initial assessment she had been on these medications in the past.  Reports auditory hallucinations and depression.  Patient reports multiple medication side effects.  States vomiting and worsening anxiety.  Discussed discontinuing Abilify 5 mg  Hydroxyzine 25 mg.  Will reintroduce medication in a few days.  Patient to continue Lexapro.  Patient was receptive to plan.  Discussed follow-up with NP on4/08/2020  Visit Diagnosis: No diagnosis found.  Past Psychiatric History:   Past Medical History:  Past Medical History:  Diagnosis Date  . Anxiety   . Depression   . Medical history non-contributory   . Vision abnormalities     Past Surgical History:  Procedure Laterality Date  . WISDOM TOOTH EXTRACTION      Family  Psychiatric History:   Family History:  Family History  Problem Relation Age of Onset  . Rheum arthritis Mother   . Lupus Mother   . Obesity Mother   . Hypertension Mother   . Kidney disease Mother   . Healthy Sister   . Breast cancer Maternal Grandmother   . Cancer Maternal Grandmother        breast cancer  . Diabetes Maternal Grandmother   . Hypertension Maternal Grandmother   . Hyperlipidemia Maternal Grandmother   . Breast cancer Cousin   . Diabetes Father     Social History:  Social History   Socioeconomic History  . Marital status: Single    Spouse name: Not on file  . Number of children: Not on file  . Years of education: Not on file  . Highest education level: Not on file  Occupational History  . Not on file  Tobacco Use  . Smoking status: Never Smoker  . Smokeless tobacco: Never Used  Vaping Use  . Vaping Use: Never used  Substance and Sexual Activity  . Alcohol use: Yes    Comment: Rare  . Drug use: Never  . Sexual activity: Not Currently    Birth control/protection: Injection    Comment: 1st intercourse- 16, partners- 2,   Other Topics Concern  . Not on file  Social History Narrative  . Not on file   Social Determinants of Health   Financial Resource Strain: Not on file  Food Insecurity: Not on file  Transportation Needs: Not on file  Physical Activity: Not on file  Stress:  Not on file  Social Connections: Not on file    Allergies: No Known Allergies  Metabolic Disorder Labs: Lab Results  Component Value Date   HGBA1C 4.8 04/28/2018   MPG 91 04/28/2018   Lab Results  Component Value Date   PROLACTIN 74.5 (H) 04/28/2018   Lab Results  Component Value Date   CHOL 125 04/28/2018   TRIG 58 04/28/2018   HDL 51 04/28/2018   CHOLHDL 2.5 04/28/2018   VLDL 12 04/28/2018   LDLCALC 62 04/28/2018   Lab Results  Component Value Date   TSH 1.611 04/28/2018    Therapeutic Level Labs: No results found for: LITHIUM No results found for:  VALPROATE No components found for:  CBMZ  Current Medications: Current Outpatient Medications  Medication Sig Dispense Refill  . ARIPiprazole (ABILIFY) 5 MG tablet Take 1 tablet (5 mg total) by mouth daily. 30 tablet 0  . cetirizine (ZYRTEC ALLERGY) 10 MG tablet Take 1 tablet (10 mg total) by mouth daily. (Patient not taking: Reported on 11/06/2020) 30 tablet 0  . escitalopram (LEXAPRO) 10 MG tablet Take 1 tablet (10 mg total) by mouth daily. 30 tablet 2  . famotidine (PEPCID) 20 MG tablet Take 1 tablet (20 mg total) by mouth 2 (two) times daily. 60 tablet 6  . fluticasone (FLONASE) 50 MCG/ACT nasal spray Place 1 spray into both nostrils daily. (Patient not taking: Reported on 11/06/2020) 16 g 0  . glycopyrrolate (ROBINUL) 2 MG tablet Take 1 tablet (2 mg total) by mouth 2 (two) times daily as needed (, Abdominal Cramping/IBS/Diarrhea). (Patient not taking: Reported on 11/06/2020) 60 tablet 6  . hydrOXYzine (VISTARIL) 25 MG capsule Take 1 capsule (25 mg total) by mouth 3 (three) times daily as needed. 30 capsule 0  . medroxyPROGESTERone (DEPO-PROVERA) 150 MG/ML injection Inject 1 mL (150 mg total) into the muscle every 3 (three) months. 1 mL 4  . naproxen (NAPROSYN) 500 MG tablet Take 1 tablet (500 mg total) by mouth 2 (two) times daily. (Patient not taking: Reported on 11/06/2020) 30 tablet 0  . ondansetron (ZOFRAN ODT) 4 MG disintegrating tablet Take 1 tablet (4 mg total) by mouth every 8 (eight) hours as needed for nausea or vomiting. (Patient not taking: Reported on 11/06/2020) 10 tablet 0  . predniSONE (STERAPRED UNI-PAK 21 TAB) 10 MG (21) TBPK tablet As directed (Patient not taking: Reported on 11/06/2020) 21 tablet 0   No current facility-administered medications for this visit.     Musculoskeletal: Strength & Muscle Tone: within normal limits Gait & Station: normal Patient leans: N/A  Psychiatric Specialty Exam: Review of Systems  Psychiatric/Behavioral: Positive for behavioral  problems. Negative for hallucinations.  All other systems reviewed and are negative.   There were no vitals taken for this visit.There is no height or weight on file to calculate BMI.  General Appearance: Casual and Guarded  Eye Contact:  Good  Speech:  Clear and Coherent  Volume:  Normal  Mood:  Anxious and Depressed  Affect:  Congruent  Thought Process:  Coherent  Orientation:  Full (Time, Place, and Person)  Thought Content: Logical   Suicidal Thoughts:  No  Homicidal Thoughts:  No  Memory:  Immediate;   Fair Recent;   Fair  Judgement:  Fair  Insight:  Fair  Psychomotor Activity:  Normal  Concentration:  Concentration: Fair  Recall:  Good  Fund of Knowledge: Good  Language: Fair  Akathisia:  No  Handed:  Right  AIMS (if indicated):  Assets:  Communication Skills Physical Health Social Support  ADL's:  Intact  Cognition: WNL  Sleep:  Fair   Screenings: AIMS   Flowsheet Row Admission (Discharged) [facility]  AIMS Total Score 0    GAD-7   Flowsheet Row Office Visit from 06/07/2019 in [facility] Office Visit from 09/23/2018 in [facility] at [facility]  Total GAD-7 Score 14 6    PHQ2-9   Flowsheet Row Counselor from 11/06/2020 [facility] Office Visit from 06/07/2019 in [facility] Office Visit from 09/23/2018 in Primary Care at [facility] Total Score 6 3 2   PHQ-9 Total Score 22 16 7     Flowsheet Row Counselor from 11/06/2020 in [facility] Admission (Discharged) from OP [facility]  C-SSRS RISK CATEGORY Error: Question 6 not populated High Risk       Assessment and Plan:  Continue intensive outpatient programming -Restart Lexapro 10 mg p.o. daily Discontinue hydroxyzine and Abilify.  Will reintroduce medication in 3  days.  Treatment plan was reviewed and agreed upon by NP. T.Chauntelle Azpeitia and patient Naika Noto- Gonzaelez need for group services   06/28/2018, NP 11/15/2020, 10:13 AM

## 2020-11-15 NOTE — Progress Notes (Signed)
Virtual Visit via Video Note  I connected with Gail [name] on 11/15/20 at  9:00 AM EDT by a video enabled telemedicine application and verified that I am speaking with the correct person using two identifiers.  At orientation to the IOP program, Case Manager discussed the limitations of evaluation and management by telemedicine and the availability of in person appointments. The patient expressed understanding and agreed to proceed with virtual visits throughout the duration of the program.   Location:  Patient: Patient Home Provider: Home Office   History of Present Illness: MDD  Observations/Objective: Check In: Case Manager checked in with all participants to review discharge dates, insurance authorizations, work-related documents and needs from the treatment team regarding medications. Client stated needs and engaged in discussion. Client presents with moderate depression and moderate anxiety. Client denied any current SI/HI/psychosis.  Initial Therapeutic Activity: Counselor introduced [name], [name] Chaplain to present information and discussion on Grief and Loss. Group members engaged in discussion, sharing how grief impacts them, what comforts them, what emotions are felt, labeling losses, etc. After guest speaker logged off, Counselor prompted group to spend 10-15 minutes journaling to process personal grief and loss situations. Counselor processed entries with group and client's identified areas for additional processing in individual therapy. Client noted general grief and loss experiences.    Second Therapeutic Activity: Counselor shared additional psychoeducation on addressing and preventing panic attacks. Group processed learnings and discussed practical application of skills learned over the past 3 days. Client noted better understanding on anxiety cycle and how to address more effectively in the future.     Check Out: Counselor prompted group members to  share their plans for self-care or productivity activities they will engage in today. Client stated that they plan to play video games, rest and start new medication regimen. Client endorsed safety plan to be followed to prevent safety issues.   Assessment and Plan: Clinician recommends that Client remain in IOP treatment to better manage mental health symptoms, stabilization and to address treatment plan goals. Clinician recommends adherence to crisis/safety plan, taking medications as prescribed, and following up with medical professionals if any issues arise.   Follow Up Instructions: Clinician will send Webex link for next session. The Client was advised to call back or seek an in-person evaluation if the symptoms worsen or if the condition fails to improve as anticipated.     I provided 180 minutes of non-face-to-face time during this encounter.     Hilbert Odor, LCSW

## 2020-11-15 NOTE — Progress Notes (Signed)
Virtual Visit via Video Note  I connected with Denika Krone [name] on 11/14/20 at  9:00 AM EDT by a video enabled telemedicine application and verified that I am speaking with the correct person using two identifiers.  At orientation to the IOP program, Case Manager discussed the limitations of evaluation and management by telemedicine and the availability of in person appointments. The patient expressed understanding and agreed to proceed with virtual visits throughout the duration of the program.   Location:  Patient: Patient Home Provider: Home Office   History of Present Illness: MDD  Observations/Objective: Check In: Case Manager checked in with all participants to review discharge dates, insurance authorizations, work-related documents and needs from the treatment team regarding medications. Client stated needs and engaged in discussion.   Initial Therapeutic Activity: Counselor facilitated therapeutic processing with group members to assess mood and current functioning, prompting group members to share about application of skills, progress and challenges in treatment/personal lives. Client shared positive outcomes from skills application and shared about a challenge, receiving feedback from group members on strategies to address. Client presents with moderate depression and moderate anxiety. Client denied any current SI/HI/psychosis.   Second Therapeutic Activity: Counselor facilitated a series of assessments on Anxiety Igniting Thoughts. Counselor had group members assess pessimism, obsessive thinking, worry, guilt and shame, perfectionism, right-brained thinking and catastrophizing. Counselor prompted group members to share their scores, processing their thoughts and feelings about results and making a plan for addressing more directly in individual therapy through CBT interventions. Counselor shared psychoeducation on addressing and preventing panic attacks. Group processed  learnings.    Check Out: Counselor prompted group members to share their plans for self-care or productivity activities they will engage in today. Client stated that they plan to finish laundry and work on [employer]. Client endorsed safety plan to be followed to prevent safety issues.   Assessment and Plan: Clinician recommends that Client remain in IOP treatment to better manage mental health symptoms, stabilization and to address treatment plan goals. Clinician recommends adherence to crisis/safety plan, taking medications as prescribed, and following up with medical professionals if any issues arise.   Follow Up Instructions: Clinician will send Webex link for next session. The Client was advised to call back or seek an in-person evaluation if the symptoms worsen or if the condition fails to improve as anticipated.     I provided 180 minutes of non-face-to-face time during this encounter.     Hilbert Odor, LCSW

## 2020-11-16 ENCOUNTER — Other Ambulatory Visit (HOSPITAL_COMMUNITY): Payer: 59 | Admitting: Psychiatry

## 2020-11-16 ENCOUNTER — Other Ambulatory Visit: Payer: Self-pay

## 2020-11-16 ENCOUNTER — Encounter (HOSPITAL_COMMUNITY): Payer: Self-pay

## 2020-11-16 DIAGNOSIS — F323 Major depressive disorder, single episode, severe with psychotic features: Secondary | ICD-10-CM

## 2020-11-16 NOTE — Progress Notes (Signed)
Virtual Visit via Video Note  I connected with Gail [name] on 11/16/20 at  9:00 AM EDT by a video enabled telemedicine application and verified that I am speaking with the correct person using two identifiers.  At orientation to the IOP program, Case Manager discussed the limitations of evaluation and management by telemedicine and the availability of in person appointments. The patient expressed understanding and agreed to proceed with virtual visits throughout the duration of the program.   Location:  Patient: Patient Home Provider: Home Office   History of Present Illness: MDD  Observations/Objective: Check In: Case Manager checked in with all participants to review discharge dates, insurance authorizations, work-related documents and needs from the treatment team regarding medications. Client stated needs and engaged in discussion.   Initial Therapeutic Activity: Counselor facilitated therapeutic processing with group members to assess mood and current functioning, prompting group members to share about application of skills, progress and challenges in treatment/personal lives. Topics covered comfort items, how to support someone who is suicidal, how to attend to your own mental health needs when those around you are depressed/suicidal, emotional intelligence and managing grief and loss fatigue. Client engaged in discussions and shared application of skills. Client presents with moderate depression and moderate anxiety. Client denied any current SI/HI/psychosis.   Second Therapeutic Activity: Counselor presented information on Assertive Communication skills with group using 2 worksheets. Counselor facilitated conversation around differences in passive, passive-aggressive and aggressive communication, compared to assertive communication. Counselor engaged group in an activity to practice assertive responses. Client understood concepts and was able to apply skills. Client  identifies as naturally passive and will work in the coming weeks to be more assertive in conversation and situations with others.    Check Out: Counselor prompted group members to share their plans for self-care or productivity activities they will engage in today. Client stated that they plan to do homework and pack for weekend trip with family. Client endorsed safety plan to be followed to prevent safety issues.   Assessment and Plan: Clinician recommends that Client remain in IOP treatment to better manage mental health symptoms, stabilization and to address treatment plan goals. Clinician recommends adherence to crisis/safety plan, taking medications as prescribed, and following up with medical professionals if any issues arise.   Follow Up Instructions: Clinician will send Webex link for next session. The Client was advised to call back or seek an in-person evaluation if the symptoms worsen or if the condition fails to improve as anticipated.     I provided 180 minutes of non-face-to-face time during this encounter.     Hilbert Odor, LCSW

## 2020-11-17 ENCOUNTER — Telehealth (HOSPITAL_COMMUNITY): Payer: Self-pay | Admitting: Psychiatry

## 2020-11-17 ENCOUNTER — Other Ambulatory Visit: Payer: Self-pay

## 2020-11-17 ENCOUNTER — Other Ambulatory Visit (HOSPITAL_COMMUNITY): Payer: 59 | Admitting: Psychiatry

## 2020-11-20 ENCOUNTER — Other Ambulatory Visit: Payer: Self-pay

## 2020-11-20 ENCOUNTER — Encounter (HOSPITAL_COMMUNITY): Payer: Self-pay

## 2020-11-20 ENCOUNTER — Other Ambulatory Visit (HOSPITAL_COMMUNITY): Payer: 59 | Attending: Psychiatry | Admitting: Psychiatry

## 2020-11-20 DIAGNOSIS — F323 Major depressive disorder, single episode, severe with psychotic features: Secondary | ICD-10-CM

## 2020-11-20 DIAGNOSIS — F329 Major depressive disorder, single episode, unspecified: Secondary | ICD-10-CM | POA: Insufficient documentation

## 2020-11-20 NOTE — Progress Notes (Signed)
Virtual Visit via Video Note  I connected with Gail [name] on 11/20/20 at  9:00 AM EDT by a video enabled telemedicine application and verified that I am speaking with the correct person using two identifiers.  At orientation to the IOP program, Case Manager discussed the limitations of evaluation and management by telemedicine and the availability of in person appointments. The patient expressed understanding and agreed to proceed with virtual visits throughout the duration of the program.   Location:  Patient: Patient Home Provider: Home Office   History of Present Illness: MDD  Observations/Objective: Check In: Case Manager checked in with all participants to review discharge dates, insurance authorizations, work-related documents and needs from the treatment team regarding medications. Client stated needs and engaged in discussion. Case Manager introduced 2 new Clients to the group, with group members starting the joining process.   Initial Therapeutic Activity: Counselor facilitated therapeutic processing with group members to assess mood and current functioning, prompting group members to share about application of skills, progress and challenges in treatment/personal lives. Topics covered: grief and loss, repairing relationships, faith related to recovery, overcoming anxiety and structuring day. Client engaged in discussions and shared application of skills. Client presents with moderate depression and moderate anxiety. Client denied any current SI/HI/psychosis.   Second Therapeutic Activity: Counselor engaged group in creation of ECOMaps to assess their support systems, relationship dynamics, energy given and taken and boundaries in relationships. Counselor prompted group members to share their final products, highlighting themes/areas of concern in the visual representation of their lives. Counselor challenged group members to choose 2-3 dynamics they would like to improve  over the next few months using communication and boundary setting skills. Client engaged in discussion and participated well in activity.     Check Out: Counselor prompted group members to choose a relaxation or self-care activity for the afternoon, as we covered heavy emotional topics in group. Counselor promoted self-compassion. Client endorsed safety plan to be followed to prevent safety issues.   Assessment and Plan: Clinician recommends that Client remain in IOP treatment to better manage mental health symptoms, stabilization and to address treatment plan goals. Clinician recommends adherence to crisis/safety plan, taking medications as prescribed, and following up with medical professionals if any issues arise.   Follow Up Instructions: Clinician will send Webex link for next session. The Client was advised to call back or seek an in-person evaluation if the symptoms worsen or if the condition fails to improve as anticipated.     I provided 180 minutes of non-face-to-face time during this encounter.     Hilbert Odor, LCSW

## 2020-11-21 ENCOUNTER — Encounter (HOSPITAL_COMMUNITY): Payer: Self-pay | Admitting: Psychiatry

## 2020-11-21 ENCOUNTER — Other Ambulatory Visit: Payer: Self-pay

## 2020-11-21 ENCOUNTER — Other Ambulatory Visit (HOSPITAL_COMMUNITY): Payer: 59 | Admitting: Family

## 2020-11-21 DIAGNOSIS — F329 Major depressive disorder, single episode, unspecified: Secondary | ICD-10-CM | POA: Diagnosis not present

## 2020-11-21 DIAGNOSIS — F323 Major depressive disorder, single episode, severe with psychotic features: Secondary | ICD-10-CM

## 2020-11-21 NOTE — Progress Notes (Signed)
Virtual Visit via Video Note  I connected with Gail Leisinger [name] on 11/21/20 at  9:00 AM EDT by a video enabled telemedicine application and verified that I am speaking with the correct person using two identifiers.  At orientation to the IOP program, Case Manager discussed the limitations of evaluation and management by telemedicine and the availability of in person appointments. The patient expressed understanding and agreed to proceed with virtual visits throughout the duration of the program.   Location:  Patient: Patient Home Provider: Home Office   History of Present Illness: MDD  Observations/Objective: Check In: Case Manager checked in with all participants to review discharge dates, insurance authorizations, work-related documents and needs from the treatment team regarding medications. Client stated needs and engaged in discussion. Case Manager introduced 1 new Client to the group, with group members starting the joining process.   Initial Therapeutic Activity: Counselor facilitated therapeutic processing with group members to assess mood and current functioning, prompting group members to share about application of skills, progress and challenges in treatment/personal lives. Topics covered: panic attacks, coping skills, grief work, childhood traumas, and medication management. Client engaged in discussions and shared application of skills. Client presents with mild depression and moderate anxiety. Client denied any current SI/HI/psychosis.   Second Therapeutic Activity: Counselor prompted group members to compare and contrast their anxiety and depression. Counselor instructed group to write down symptoms, thoughts, feelings, behaviors, sensations, triggers and anything else connected with their anxiety and depression to see what was similar and different. Group members shared their results, discussing how they are impacted and themes in their reflections. Counselor prompted  group members to choose 2 or 3 symptoms to focus on improving or addressing throughout their group treatment process. Client engaged and participated well in the activity.    Check Out: Counselor prompted group members to choose a relaxation or self-care activity for the afternoon, as we covered heavy emotional topics in group. Counselor promoted self-compassion. Client endorsed safety plan to be followed to prevent safety issues.   Assessment and Plan: Clinician recommends that Client remain in IOP treatment to better manage mental health symptoms, stabilization and to address treatment plan goals. Clinician recommends adherence to crisis/safety plan, taking medications as prescribed, and following up with medical professionals if any issues arise.   Follow Up Instructions: Clinician will send Webex link for next session. The Client was advised to call back or seek an in-person evaluation if the symptoms worsen or if the condition fails to improve as anticipated.     I provided 180 minutes of non-face-to-face time during this encounter.     Hilbert Odor, LCSW

## 2020-11-22 ENCOUNTER — Other Ambulatory Visit: Payer: Self-pay

## 2020-11-22 ENCOUNTER — Encounter (HOSPITAL_COMMUNITY): Payer: Self-pay

## 2020-11-22 ENCOUNTER — Other Ambulatory Visit (HOSPITAL_COMMUNITY): Payer: 59 | Admitting: Psychiatry

## 2020-11-22 DIAGNOSIS — F323 Major depressive disorder, single episode, severe with psychotic features: Secondary | ICD-10-CM

## 2020-11-22 DIAGNOSIS — F329 Major depressive disorder, single episode, unspecified: Secondary | ICD-10-CM | POA: Diagnosis not present

## 2020-11-22 NOTE — Progress Notes (Signed)
   I connected with Gail [name] on 11/22/20 at  9:00 AM EDT by telephone and verified that I am speaking with the correct person using two identifiers.  Location: Patient: Home Provider: Office   I discussed the limitations, risks, security and privacy concerns of performing an evaluation and management service by telephone and the availability of in person appointments. I also discussed with the patient that there may be a patient responsible charge related to this service. The patient expressed understanding and agreed to proceed.   I discussed the assessment and treatment plan with the patient. The patient was provided an opportunity to ask questions and all were answered. The patient agreed with the plan and demonstrated an understanding of the instructions.   The patient was advised to call back or seek an in-person evaluation if the symptoms worsen or if the condition fails to improve as anticipated.  I provided 15 minutes of non-face-to-face time during this encounter.   Gail Rack, NP   [facility] Discharge Summary  Gail [name] [id number]  Admission date: 11/06/2020 Discharge date: 11/23/2020  Reason for admission: Per admission assessment note:Gail [name] is a 20 year old female presents after a referral by he therapist. Patient reported history with depression, generalized anxiety disorder and schizophrenia.  Reports she was followed by psychiatry in the past however has not been consistent with outpatient follow-up.  Reports she was prescribed Lexapro and trazodone for mood stabilization.  Discussed restarting medication and will initiate Abilify due to auditory hallucination.   Progress in Program Toward Treatment Goals: Ongoing, patient attended and participated with daily group sessions with active in engaged participation. Patient was initiated on Abilify, Lexapro and Hydroxyzine  she reported nausea and headaches with restarting all of her medications at once. Discussed Take medications with food and at night. Patient was receptive to plan. Will make refills avab x 1 month.   Progress (rationale): Keep follow-up with Gail [name] -thearpist. Keep follow-up with PCP and [facility].    Take all medications as prescribed. Keep all follow-up appointments as scheduled.  Do not consume alcohol or use illegal drugs while on prescription medications. Report any adverse effects from your medications to your primary care provider promptly.  In the event of recurrent symptoms or worsening symptoms, call 911, a crisis hotline, or go to the nearest emergency department for evaluation.   Gail Rack, NP 11/22/2020

## 2020-11-22 NOTE — Patient Instructions (Signed)
D:  Patient completed MH-IOP today.  A:  Discharge today.  Follow up with Abel Presto, [facility] (patient to call for an appt).  Provided pt with [facility] [phone]) for medication management.  R:  Patient receptive.

## 2020-11-22 NOTE — Progress Notes (Signed)
Virtual Visit via Video Note  I connected with Gail [name] on @TODAY @ at  9:00 AM EDT by a video enabled telemedicine application and verified that I am speaking with the correct person using two identifiers.  Location: Patient: at home Provider: at office   I discussed the limitations of evaluation and management by telemedicine and the availability of in person appointments. The patient expressed understanding and agreed to proceed.  I discussed the assessment and treatment plan with the patient. The patient was provided an opportunity to ask questions and all were answered. The patient agreed with the plan and demonstrated an understanding of the instructions.   The patient was advised to call back or seek an in-person evaluation if the symptoms worsen or if the condition fails to improve as anticipated.  I provided 20 minutes of non-face-to-face time during this encounter.   , M.Ed,CNA   Patient ID: Gail [name], female   DOB: 2001/05/01, 20 y.o.   MRN: [id number] Intake/Chief Complaint:  This is a 20 yr old, single female who was referred per 26, [facility]; treatment for worsening depressive and anxiety symptoms.  Pt denies SI/HI and V/Hallucinations.  Pt admits to A/Hallucinations.  Pt reports being diagnosed with "Schizophrenia" when she was in [facility] (2019).  Pt states her sx's started to worsen a couple of months ago.  "I stress over everything."  Pt has been struggling with anxiety thus making the A/Hallucinations worsen at times.  "I hear someone calling my name, but I know that there's no one calling me and that it's in my head."  Pt admits to previously taking various meds but states they always made her feel worse.  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  States that the amount of courses she is taking and the amount of time she spends on each class is very taxing.  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   pt works Wed-Sun (9pm-8 am.).  Pt states her mother, sister and friends are her support system.  Current Symptoms/Problems: Sadness, very anxious, isolative, poor concentration, anhedonia, low energy, no motivation, tearfulness, racing thoughts, increased sleep, decreased appetite, A/hallucinations ("I hear someone calling my name")  Denies commanding voices."  Pt completed MH-IOP today.  Pt attended all 12 authorized days.  Reports feeling "good."  "I have learned different coping skills that I can use."  Pt denies SI/HI or V/ Hallucinations; but still admits to A/ hallucinations.  States that the voice is still someone calling her name; confirms that it's not a commanding voice. On a scale of 1-10 (10 being the worst); pt rates her anxiety and depression at a 4. A:  D/C today.  Pt will call Nature conservation officer, [facility] for an appt.  Provided pt with [facility]'s([phone]) phone number in order to get medication f/u.  Strongly encouraged support groups through [facility] [phone]).  R: Patient receptive.  ([phone], M.Ed,CNA

## 2020-11-22 NOTE — Progress Notes (Signed)
Virtual Visit via Video Note  I connected with Gail [name] on 11/22/20 at  9:00 AM EDT by a video enabled telemedicine application and verified that I am speaking with the correct person using two identifiers.  At orientation to the IOP program, Case Manager discussed the limitations of evaluation and management by telemedicine and the availability of in person appointments. The patient expressed understanding and agreed to proceed with virtual visits throughout the duration of the program.   Location:  Patient: Patient Home Provider: Home Office   History of Present Illness: MDD  Observations/Objective: Check In: Case Manager checked in with all participants to review discharge dates, insurance authorizations, work-related documents and needs from the treatment team regarding medications. Client stated needs and engaged in discussion. Case Manager introduced 1 new Client to the group, with group members starting the joining process.   Initial Therapeutic Activity: Counselor introduced our guest speaker, Peggye Fothergill, [facility] Pharmacist, who shared about psychiatric medications, side effects, treatment considerations and how to communicate with medical professionals. Group Members asked questions and shared medication concerns. Counselor prompted group members to reference a worksheet called, "Body Scan" to jot down questions and concerns about their physical health in preparation for their upcoming appointments with medical professionals. Client looking to set up appointment with dermatologist to address skin issues.  Counselor encouraged routine medical check-ups, preparing for appointments, following up with recommendations and seeking specialist if needed.   Second Therapeutic Activity: Counselor facilitated therapeutic processing with group members to assess mood and current functioning, prompting group members to share about application of skills, progress and challenges in  treatment/personal lives. Topics covered: panic attacks, coping skills, grief work, childhood traumas, and medication management. Client engaged in discussions and shared application of skills. Client presents with mild depression and mild anxiety. Client denied any current SI/HI/psychosis.   Check Out: Counselor closed program by allowing time to celebrate a graduating group member. Counselor shared reflections on progress and allow space for group members to share well wishes and appreciates to the graduating client. Counselor prompted graduating client to share takeaways, reflect on progress and final thoughts for the group.Client appreciative of the process, progress and connectivity in treatment. Client reports feeling more prepared to handle challenges in the future. Counselor prompted group members to choose a relaxation or self-care activity for the afternoon, as we covered heavy emotional topics in group. Counselor promoted self-compassion. Client endorsed safety plan to be followed to prevent safety issues.   Assessment and Plan: Clinician recommends that Client step up to PHP treatment to better manage mental health symptoms, stabilization and to address treatment plan goals. Clinician recommends adherence to crisis/safety plan, taking medications as prescribed, and following up with medical professionals if any issues arise.   Follow Up Instructions: Clinician will send Webex link for next session. The Client was advised to call back or seek an in-person evaluation if the symptoms worsen or if the condition fails to improve as anticipated.     I provided 180 minutes of non-face-to-face time during this encounter.     Hilbert Odor, LCSW

## 2020-11-23 ENCOUNTER — Other Ambulatory Visit (HOSPITAL_COMMUNITY): Payer: 59

## 2020-11-23 ENCOUNTER — Other Ambulatory Visit: Payer: Self-pay

## 2020-11-24 ENCOUNTER — Ambulatory Visit (HOSPITAL_COMMUNITY): Payer: 59

## 2020-11-25 ENCOUNTER — Other Ambulatory Visit (HOSPITAL_COMMUNITY): Payer: Self-pay | Admitting: Family

## 2020-11-27 ENCOUNTER — Ambulatory Visit (HOSPITAL_COMMUNITY): Payer: 59

## 2020-11-28 ENCOUNTER — Ambulatory Visit (HOSPITAL_COMMUNITY): Payer: 59

## 2020-11-29 ENCOUNTER — Ambulatory Visit (INDEPENDENT_AMBULATORY_CARE_PROVIDER_SITE_OTHER): Payer: 59 | Admitting: Obstetrics & Gynecology

## 2020-11-29 ENCOUNTER — Encounter: Payer: Self-pay | Admitting: Obstetrics & Gynecology

## 2020-11-29 ENCOUNTER — Other Ambulatory Visit: Payer: Self-pay

## 2020-11-29 VITALS — Ht 60.5 in | Wt 107.0 lb

## 2020-11-29 DIAGNOSIS — Z3042 Encounter for surveillance of injectable contraceptive: Secondary | ICD-10-CM | POA: Diagnosis not present

## 2020-11-29 DIAGNOSIS — Z01419 Encounter for gynecological examination (general) (routine) without abnormal findings: Secondary | ICD-10-CM

## 2020-11-29 DIAGNOSIS — R350 Frequency of micturition: Secondary | ICD-10-CM | POA: Diagnosis not present

## 2020-11-29 NOTE — Progress Notes (Signed)
    Gail [name] Oct 11, 2000 [id number]   History:    20 y.o. G0 Single.  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  EL:[id number] presenting for annual gyn exam   UMP:NTIR on DepoProvera started 09/2018. No BTB. No pelvic pain.  Frequent urination, no recent change.  Eats a lot of chocolate, drinks some coffee. Had IC with condoms in the past, not currently sexually active. STI screen Negative 09/2018. Breasts normal. BMI 20.55. Physically active.  Past medical history,surgical history, family history and social history were all reviewed and documented in the EPIC chart.  Gynecologic History No LMP recorded. Patient has had an injection.  Obstetric History OB History  Gravida Para Term Preterm AB Living  0 0 0 0 0 0  SAB IAB Ectopic Multiple Live Births  0 0 0 0 0     ROS: A ROS was performed and pertinent positives and negatives are included in the history.  GENERAL: No fevers or chills. HEENT: No change in vision, no earache, sore throat or sinus congestion. NECK: No pain or stiffness. CARDIOVASCULAR: No chest pain or pressure. No palpitations. PULMONARY: No shortness of breath, cough or wheeze. GASTROINTESTINAL: No abdominal pain, nausea, vomiting or diarrhea, melena or bright red blood per rectum. GENITOURINARY: No urinary frequency, urgency, hesitancy or dysuria. MUSCULOSKELETAL: No joint or muscle pain, no back pain, no recent trauma. DERMATOLOGIC: No rash, no itching, no lesions. ENDOCRINE: No polyuria, polydipsia, no heat or cold intolerance. No recent change in weight. HEMATOLOGICAL: No anemia or easy bruising or bleeding. NEUROLOGIC: No headache, seizures, numbness, tingling or weakness. PSYCHIATRIC: No depression, no loss of interest in normal activity or change in sleep pattern.     Exam:   Ht 5' 0.5" (1.537 m)   Wt 107 lb (48.5 kg)   BMI 20.55 kg/m   Body mass index is 20.55 kg/m.  General appearance : Well developed well nourished female. No  acute distress HEENT: Eyes: no retinal hemorrhage or exudates,  Neck supple, trachea midline, no carotid bruits, no thyroidmegaly Lungs: Clear to auscultation, no rhonchi or wheezes, or rib retractions  Heart: Regular rate and rhythm, no murmurs or gallops Breast:Examined in sitting and supine position were symmetrical in appearance, no palpable masses or tenderness,  no skin retraction, no nipple inversion, no nipple discharge, no skin discoloration, no axillary or supraclavicular lymphadenopathy Abdomen: no palpable masses or tenderness, no rebound or guarding Extremities: no edema or skin discoloration or tenderness  Pelvic: Deferred as patient is abstinent  U/A: Yellow clear, protein negative, nitrite negative, white blood cells 0-5, red blood cells 3-10, bacteria moderate.  Urine culture pending.   Assessment/Plan:  20 y.o. female for annual exam   1. Well female exam with routine gynecological exam Deferred gynecologic exam.  Patient abstinent.  Breast exam normal.  Good body mass index at 20.55.  Continue with fitness and healthy nutrition.  2. Surveillance for Depo-Provera contraception Receiving DepoProvera through [facility] NP.  Recent injection received.  No BTB.  No CI to continue.  3. Frequency of urination No significant abnormality on U/A, will wait on Urine Culture to decide on treatment.  Recommend to decrease chocolate intake and coffee.  Increase water intake. - Urinalysis,Complete w/RFL Culture  Genia Del MD, 9:02 AM 11/29/2020

## 2020-12-01 ENCOUNTER — Other Ambulatory Visit (HOSPITAL_COMMUNITY): Payer: Self-pay | Admitting: Family

## 2020-12-01 LAB — URINALYSIS, COMPLETE W/RFL CULTURE
Bilirubin Urine: NEGATIVE
Glucose, UA: NEGATIVE
Hyaline Cast: NONE SEEN /LPF
Ketones, ur: NEGATIVE
Leukocyte Esterase: NEGATIVE
Nitrites, Initial: NEGATIVE
Protein, ur: NEGATIVE
Specific Gravity, Urine: 1.025 (ref 1.001–1.03)
pH: 6.5 (ref 5.0–8.0)

## 2020-12-01 LAB — URINE CULTURE
MICRO NUMBER:: 11765415
SPECIMEN QUALITY:: ADEQUATE

## 2020-12-01 LAB — CULTURE INDICATED

## 2021-01-02 ENCOUNTER — Encounter: Payer: Self-pay | Admitting: Nurse Practitioner

## 2021-01-02 ENCOUNTER — Ambulatory Visit (INDEPENDENT_AMBULATORY_CARE_PROVIDER_SITE_OTHER): Payer: 59 | Admitting: Nurse Practitioner

## 2021-01-02 ENCOUNTER — Other Ambulatory Visit: Payer: Self-pay

## 2021-01-02 VITALS — BP 102/64 | HR 74 | Temp 98.4°F | Ht 61.0 in | Wt 105.4 lb

## 2021-01-02 DIAGNOSIS — Z0000 Encounter for general adult medical examination without abnormal findings: Secondary | ICD-10-CM | POA: Diagnosis not present

## 2021-01-02 LAB — COMPREHENSIVE METABOLIC PANEL
ALT: 12 U/L (ref 0–35)
AST: 13 U/L (ref 0–37)
Albumin: 4.2 g/dL (ref 3.5–5.2)
Alkaline Phosphatase: 48 U/L (ref 39–117)
BUN: 8 mg/dL (ref 6–23)
CO2: 24 mEq/L (ref 19–32)
Calcium: 9.5 mg/dL (ref 8.4–10.5)
Chloride: 105 mEq/L (ref 96–112)
Creatinine, Ser: 0.81 mg/dL (ref 0.40–1.20)
GFR: 104.44 mL/min (ref >60.00)
Glucose, Bld: 89 mg/dL (ref 70–99)
Potassium: 3.7 mEq/L (ref 3.5–5.1)
Sodium: 136 mEq/L (ref 135–145)
Total Bilirubin: 1.6 mg/dL — ABNORMAL HIGH (ref 0.2–1.2)
Total Protein: 6.6 g/dL (ref 6.0–8.3)

## 2021-01-02 LAB — TSH: TSH: 1.96 u[IU]/mL (ref 0.35–5.50)

## 2021-01-02 LAB — CBC WITH DIFFERENTIAL/PLATELET
Basophils Absolute: 0 K/uL (ref 0.0–0.1)
Basophils Relative: 0.5 % (ref 0.0–3.0)
Eosinophils Absolute: 0.2 K/uL (ref 0.0–0.7)
Eosinophils Relative: 3.2 % (ref 0.0–5.0)
HCT: 40.6 % (ref 36.0–46.0)
Hemoglobin: 13.7 g/dL (ref 12.0–15.0)
Lymphocytes Relative: 43.2 % (ref 12.0–46.0)
Lymphs Abs: 2.7 K/uL (ref 0.7–4.0)
MCHC: 33.9 g/dL (ref 30.0–36.0)
MCV: 93.5 fl (ref 78.0–100.0)
Monocytes Absolute: 0.3 K/uL (ref 0.1–1.0)
Monocytes Relative: 5.5 % (ref 3.0–12.0)
Neutro Abs: 2.9 K/uL (ref 1.4–7.7)
Neutrophils Relative %: 47.6 % (ref 43.0–77.0)
Platelets: 275 K/uL (ref 150.0–400.0)
RBC: 4.34 Mil/uL (ref 3.87–5.11)
RDW: 12.7 % (ref 11.5–14.6)
WBC: 6.2 K/uL (ref 4.5–10.5)

## 2021-01-02 NOTE — Patient Instructions (Signed)
Go to lab for blood draw  Send copy of immunization records.  Preventing Unhealthy Weight Gain, Adult Staying at a healthy weight is important to your overall health. When fat builds up in your body, you may become overweight or obese. Being overweight or obese increases your risk of developing certain health problems, such as heart disease, diabetes, sleeping problems, joint problems, and some types of cancer. Unhealthy weight gain is often the result of making unhealthy food choices or not getting enough exercise. You can make changes to your lifestyle to prevent obesity and stay as healthy as possible. What nutrition changes can be made?  Eat only as much as your body needs. To do this: ? Pay attention to signs that you are hungry or full. Stop eating as soon as you feel full. ? If you feel hungry, try drinking water first before eating. Drink enough water so your urine is clear or pale yellow. ? Eat smaller portions. Pay attention to portion sizes when eating out. ? Look at serving sizes on food labels. Most foods contain more than one serving per container. ? Eat the recommended number of calories for your gender and activity level. For most active people, a daily total of 2,000 calories is appropriate. If you are trying to lose weight or are not very active, you may need to eat fewer calories. Talk with your health care provider or a diet and nutrition specialist (dietitian) about how many calories you need each day.  Choose healthy foods, such as: ? Fruits and vegetables. At each meal, try to fill at least half of your plate with fruits and vegetables. ? Whole grains, such as whole-wheat bread, brown rice, and quinoa. ? Lean meats, such as chicken or fish. ? Other healthy proteins, such as beans, eggs, or tofu. ? Healthy fats, such as nuts, seeds, fatty fish, and olive oil. ? Low-fat or fat-free dairy products.  Check food labels, and avoid food and drinks that: ? Are high in  calories. ? Have added sugar. ? Are high in sodium. ? Have saturated fats or trans fats.  Cook foods in healthier ways, such as by baking, broiling, or grilling.  Make a meal plan for the week, and shop with a grocery list to help you stay on track with your purchases. Try to avoid going to the grocery store when you are hungry.  When grocery shopping, try to shop around the outside of the store first, where the fresh foods are. Doing this helps you to avoid prepackaged foods, which can be high in sugar, salt (sodium), and fat.   What lifestyle changes can be made?  Exercise for 30 or more minutes on 5 or more days each week. Exercising may include brisk walking, yard work, biking, running, swimming, and team sports like basketball and soccer. Ask your health care provider which exercises are safe for you.  Do muscle-strengthening activities, such as lifting weights or using resistance bands, on 2 or more days a week.  Do not use any products that contain nicotine or tobacco, such as cigarettes and e-cigarettes. If you need help quitting, ask your health care provider.  Limit alcohol intake to no more than 1 drink a day for nonpregnant women and 2 drinks a day for men. One drink equals 12 oz of beer, 5 oz of wine, or 1 oz of hard liquor.  Try to get 7-9 hours of sleep each night.   What other changes can be made?  Keep a food  and activity journal to keep track of: ? What you ate and how many calories you had. Remember to count the calories in sauces, dressings, and side dishes. ? Whether you were active, and what exercises you did. ? Your calorie, weight, and activity goals.  Check your weight regularly. Track any changes. If you notice you have gained weight, make changes to your diet or activity routine.  Avoid taking weight-loss medicines or supplements. Talk to your health care provider before starting any new medicine or supplement.  Talk to your health care provider before  trying any new diet or exercise plan. Why are these changes important? Eating healthy, staying active, and having healthy habits can help you to prevent obesity. Those changes also:  Help you manage stress and emotions.  Help you connect with friends and family.  Improve your self-esteem.  Improve your sleep.  Prevent long-term health problems. What can happen if changes are not made? Being obese or overweight can cause you to develop joint or bone problems, which can make it hard for you to stay active or do activities you enjoy. Being obese or overweight also puts stress on your heart and lungs and can lead to health problems like diabetes, heart disease, and some cancers. Where to find more information Talk with your health care provider or a dietitian about healthy eating and healthy lifestyle choices. You may also find information from:  U.[place]. Department of Agriculture, MyPlate: https://ball-collins.biz/  American Heart Association: www.heart.org  Centers for Disease Control and Prevention: FootballExhibition.com.br Summary  Staying at a healthy weight is important to your overall health. It helps you to prevent certain diseases and health problems, such as heart disease, diabetes, joint problems, sleep disorders, and some types of cancer.  Being obese or overweight can cause you to develop joint or bone problems, which can make it hard for you to stay active or do activities you enjoy.  You can prevent unhealthy weight gain by eating a healthy diet, exercising regularly, not smoking, limiting alcohol, and getting enough sleep.  Talk with your health care provider or a dietitian for guidance about healthy eating and healthy lifestyle choices. This information is not intended to replace advice given to you by your health care provider. Make sure you discuss any questions you have with your health care provider. Document Revised: 12/02/2019 Document Reviewed: 12/02/2019 Elsevier Patient Education   2021 [employer].

## 2021-01-02 NOTE — Progress Notes (Signed)
Subjective:    Patient ID: Gail [name], female    DOB: Jul 03, 2001, 19 y.o.   MRN: [id number]  Patient presents today for CPE   HPI  Sexual History (orientation,birth control, marital status, STD):single, not sexually active  Depression/Suicide: Depression screen [facility] 2/9 01/02/2021 06/07/2019 06/07/2019 09/23/2018  Decreased Interest 1 2 3 1   Down, Depressed, Hopeless 1 1 3 1   PHQ - 2 Score 2 3 6 2   Altered sleeping 2 3 - 3  Tired, decreased energy 2 3 - 1  Change in appetite 0 3 - 0  Feeling bad or failure about yourself  1 1 - 1  Trouble concentrating 0 3 - 0  Moving slowly or fidgety/restless 0 0 - 0  Suicidal thoughts 0 0 - 0  PHQ-9 Score 7 16 - 7  Difficult doing work/chores Not difficult at all - - -  Some encounter information is confidential and restricted. Go to Review Flowsheets activity to see all data.   Vision:up to date  Dental:up to date  Immunizations: (TDAP, Hep C screen, Pneumovax, Influenza, zoster)  Health Maintenance  Topic Date Due  . HPV Vaccine (1 - 2-dose series) Never done  . Tetanus Vaccine  Never done  . COVID-19 Vaccine (3 - Booster for Pfizer series) 09/02/2020  . Flu Shot  03/19/2021  . Hepatitis C Screening: USPSTF Recommendation to screen - Ages 9-79 yo.  Completed  . HIV Screening  Completed   Diet: Weight:  Wt Readings from Last 3 Encounters:  01/02/21 105 lb 6.4 oz (47.8 kg)  11/29/20 107 lb (48.5 kg)  08/29/20 110 lb (49.9 kg)   Medications and allergies reviewed with patient and updated if appropriate.  Patient Active Problem List   Diagnosis Date Noted  . Initiation of Depo Provera 09/24/2018  . MDD (major depressive disorder), single episode, severe with psychosis (HCC) 05/03/2018  . Severe major depression with psychotic features, mood-congruent (HCC) 04/28/2018  . Suicide ideation 04/28/2018  . GAD (generalized anxiety disorder) 04/28/2018    Current Outpatient Medications on File Prior to Visit   Medication Sig Dispense Refill  . ARIPiprazole (ABILIFY) 5 MG tablet Take 1 tablet (5 mg total) by mouth daily. 30 tablet 0  . famotidine (PEPCID) 20 MG tablet Take 1 tablet (20 mg total) by mouth 2 (two) times daily. 60 tablet 6  . medroxyPROGESTERone (DEPO-PROVERA) 150 MG/ML injection Inject 1 mL (150 mg total) into the muscle every 3 (three) months. 1 mL 4  . [DISCONTINUED] pantoprazole (PROTONIX) 40 MG tablet Take 1 tablet (40 mg total) by mouth 2 (two) times daily. (Patient not taking: Reported on 06/16/2020) 180 tablet 3  . [DISCONTINUED] promethazine (PHENERGAN) 25 MG tablet Take 1 tablet (25 mg total) by mouth every 6 (six) hours as needed for nausea or vomiting. (Patient not taking: Reported on 06/16/2020) 30 tablet 0   No current facility-administered medications on file prior to visit.    Past Medical History:  Diagnosis Date  . Anxiety   . Depression   . Medical history non-contributory   . Vision abnormalities     Past Surgical History:  Procedure Laterality Date  . WISDOM TOOTH EXTRACTION      Social History   Socioeconomic History  . Marital status: Single    Spouse name: Not on file  . Number of children: Not on file  . Years of education: Not on file  . Highest education level: Not on file  Occupational History  . Not on file  Tobacco Use  . Smoking status: Never Smoker  . Smokeless tobacco: Never Used  Vaping Use  . Vaping Use: Never used  Substance and Sexual Activity  . Alcohol use: Yes    Comment: Rare  . Drug use: Yes    Frequency: 3.0 times per week    Types: Marijuana  . Sexual activity: Not Currently    Birth control/protection: Injection    Comment: 1st intercourse- 16, partners- 2,   Other Topics Concern  . Not on file  Social History Narrative  . Not on file   Social Determinants of Health   Financial Resource Strain: Not on file  Food Insecurity: Not on file  Transportation Needs: Not on file  Physical Activity: Not on file   Stress: Not on file  Social Connections: Not on file    Family History  Problem Relation Age of Onset  . Rheum arthritis Mother   . Lupus Mother   . Obesity Mother   . Hypertension Mother   . Kidney disease Mother   . Healthy Sister   . Breast cancer Maternal Grandmother   . Cancer Maternal Grandmother        breast cancer  . Diabetes Maternal Grandmother   . Hypertension Maternal Grandmother   . Hyperlipidemia Maternal Grandmother   . Breast cancer Cousin   . Diabetes Father         Review of Systems  Constitutional: Negative for fever, malaise/fatigue and weight loss.  HENT: Negative for congestion and sore throat.   Eyes:       Negative for visual changes  Respiratory: Negative for cough and shortness of breath.   Cardiovascular: Negative for chest pain, palpitations and leg swelling.  Gastrointestinal: Negative for blood in stool, constipation, diarrhea and heartburn.  Genitourinary: Negative for dysuria, frequency and urgency.  Musculoskeletal: Negative for falls, joint pain and myalgias.  Skin: Negative for rash.  Neurological: Negative for dizziness, sensory change and headaches.  Endo/Heme/Allergies: Does not bruise/bleed easily.  Psychiatric/Behavioral: Positive for depression. Negative for hallucinations, substance abuse and suicidal ideas. The patient is nervous/anxious. The patient does not have insomnia.     Objective:   Vitals:   01/02/21 1139  BP: 102/64  Pulse: 74  Temp: 98.4 F (36.9 C)  SpO2: 98%    Body mass index is 19.92 kg/m.  Physical Examination:  Physical Exam Vitals reviewed.  Constitutional:      General: She is not in acute distress.    Appearance: She is well-developed.  HENT:     Right Ear: Tympanic membrane, ear canal and external ear normal.     Left Ear: Tympanic membrane, ear canal and external ear normal.  Eyes:     Extraocular Movements: Extraocular movements intact.     Conjunctiva/sclera: Conjunctivae normal.   Cardiovascular:     Rate and Rhythm: Normal rate and regular rhythm.     Pulses: Normal pulses.     Heart sounds: Normal heart sounds.  Pulmonary:     Effort: Pulmonary effort is normal. No respiratory distress.     Breath sounds: Normal breath sounds.  Chest:     Chest wall: No tenderness.  Abdominal:     General: Bowel sounds are normal.     Palpations: Abdomen is soft.  Genitourinary:    Comments: Deferred breast and pelvic exam to GYN Musculoskeletal:        General: Normal range of motion.     Cervical back: Normal range of motion and neck supple.  Right lower leg: No edema.     Left lower leg: No edema.  Skin:    General: Skin is warm and dry.  Neurological:     Mental Status: She is alert and oriented to person, place, and time.     Deep Tendon Reflexes: Reflexes are normal and symmetric.  Psychiatric:        Mood and Affect: Mood normal.        Behavior: Behavior normal.        Thought Content: Thought content normal.    ASSESSMENT and PLAN: This visit occurred during the SARS-CoV-2 public health emergency.  Safety protocols were in place, including screening questions prior to the visit, additional usage of staff PPE, and extensive cleaning of exam room while observing appropriate contact time as indicated for disinfecting solutions.   Tonia was seen today for annual exam.  Diagnoses and all orders for this visit:  Preventative health care -     CBC with Differential/Platelet -     Comprehensive metabolic panel -     TSH      Problem List Items Addressed This Visit   None   Visit Diagnoses    Preventative health care    -  Primary   Relevant Orders   CBC with Differential/Platelet   Comprehensive metabolic panel   TSH      Follow up: Return in about 1 year (around 01/02/2022) for CPE (fasting).  Alysia Penna, NP

## 2021-02-20 ENCOUNTER — Other Ambulatory Visit: Payer: Self-pay

## 2021-02-20 ENCOUNTER — Ambulatory Visit (INDEPENDENT_AMBULATORY_CARE_PROVIDER_SITE_OTHER): Payer: 59

## 2021-02-20 DIAGNOSIS — Z3042 Encounter for surveillance of injectable contraceptive: Secondary | ICD-10-CM

## 2021-02-20 LAB — POCT URINE PREGNANCY: Preg Test, Ur: NEGATIVE

## 2021-02-20 MED ORDER — MEDROXYPROGESTERONE ACETATE 150 MG/ML IM SUSP
150.0000 mg | Freq: Once | INTRAMUSCULAR | Status: AC
  Administered 2021-02-20: 10:00:00 150 mg via INTRAMUSCULAR

## 2021-02-20 NOTE — Progress Notes (Addendum)
Per orders of Alysia Penna, NP  injection of Depo-Provera given by Willaim Bane, CMA in the left upper outer quadrant of the buttocks.   Patient tolerated injection well. No signs or symptoms of a reaction were noted prior to patient leaving the nurse visit. Patient will make appointment for 3 months.  Pregnancy test performed resulted negative.

## 2021-04-19 ENCOUNTER — Telehealth (HOSPITAL_COMMUNITY): Payer: Self-pay | Admitting: Psychiatry

## 2021-04-19 NOTE — Telephone Encounter (Signed)
D:  Patient call case manager stating that she would like to return to MH-IOP.  States her therapist Abel Presto, [facility]) referred her.  A:  Re-oriented pt.  She will start MH-IOP on 04-25-21.  R:  Pt receptive.

## 2021-04-25 ENCOUNTER — Other Ambulatory Visit (HOSPITAL_COMMUNITY): Payer: 59 | Attending: Psychiatry | Admitting: Family

## 2021-04-25 ENCOUNTER — Encounter (HOSPITAL_COMMUNITY): Payer: Self-pay | Admitting: Psychiatry

## 2021-04-25 ENCOUNTER — Other Ambulatory Visit: Payer: Self-pay

## 2021-04-25 ENCOUNTER — Other Ambulatory Visit (HOSPITAL_COMMUNITY): Payer: Self-pay | Admitting: *Deleted

## 2021-04-25 DIAGNOSIS — F331 Major depressive disorder, recurrent, moderate: Secondary | ICD-10-CM

## 2021-04-25 DIAGNOSIS — F411 Generalized anxiety disorder: Secondary | ICD-10-CM | POA: Diagnosis not present

## 2021-04-25 DIAGNOSIS — Z9114 Patient's other noncompliance with medication regimen: Secondary | ICD-10-CM | POA: Diagnosis not present

## 2021-04-25 DIAGNOSIS — F323 Major depressive disorder, single episode, severe with psychotic features: Secondary | ICD-10-CM | POA: Diagnosis not present

## 2021-04-25 DIAGNOSIS — F410 Panic disorder [episodic paroxysmal anxiety] without agoraphobia: Secondary | ICD-10-CM

## 2021-04-25 NOTE — Progress Notes (Signed)
Virtual Visit via Telephone Note  I connected with [name] on @TODAY @ at  9:00 AM EDT by telephone and verified that I am speaking with the correct person using two identifiers.  Location: Patient: at home Provider: at office   I discussed the limitations, risks, security and privacy concerns of performing an evaluation and management service by telephone and the availability of in person appointments. I also discussed with the patient that there may be a patient responsible charge related to this service. The patient expressed understanding and agreed to proceed. I discussed the assessment and treatment plan with the patient. The patient was provided an opportunity to ask questions and all were answered. The patient agreed with the plan and demonstrated an understanding of the instructions.   The patient was advised to call back or seek an in-person evaluation if the symptoms worsen or if the condition fails to improve as anticipated.  I provided 25 minutes of non-face-to-face time during this encounter.   , M.Ed,CNA   Patient ID: Gail [name], female   DOB: 17-Aug-2001, 20 y.o.   MRN: [id number] Most recent note from [facility] on 11-02-20:  This is a 20 yr old, single female who was referred per 26, [facility]; treatment for worsening depressive and anxiety symptoms.  Pt denies SI/HI and V/Hallucinations.  Pt admits to A/Hallucinations.  Pt reports being diagnosed with "Schizophrenia" when she was in [facility] (2019).  Pt states her sx's started to worsen a couple of months ago.  "I stress over everything."  Pt has been struggling with anxiety thus making the A/Hallucinations worsen at times.  "I hear someone calling my name, but I know that there's no one calling me and that it's in my head."  Pt admits to previously taking various meds but states they always made her feel worse.  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  States that the amount  of courses she is taking and the amount of time she spends on each class is very taxing.  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  pt works Wed-Sun (9pm-8 am.).  Pt states her mother, sister and friends are her support system.   Current Symptoms/Problems: Sadness, very anxious, isolative, poor concentration, anhedonia, low energy, no motivation, tearfulness, racing thoughts, increased sleep, decreased appetite, A/hallucinations ("I hear someone calling my name")  Denies commanding voices."   Pt completed MH-IOP on 11-22-20.  Pt attended all 12 authorized days.  Reports feeling "good."  "I have learned different coping skills that I can use."  Pt denies SI/HI or V/ Hallucinations; but still admits to A/ hallucinations.  States that the voice is still someone calling her name; confirms that it's not a commanding voice. On a scale of 1-10 (10 being the worst); pt rates her anxiety and depression at a 4. Pt will call 01-27[facility], [facility] for an appt.  Provided pt with [facility]'s([phone]) phone number in order to get medication f/u.  Strongly encouraged support groups through [facility] [phone]).    Pt was re-admitted to MH-IOP today; referred per ([phone], [facility] d/t worsening depressive sx's with A/Hallucinations.  According to [facility], the last time pt worked at [employer] (04-04-21); she had an episode when she heard a voice telling her to choke her team lead.  Pt denies any of those commanding voices at this time.  Pt's therapist pulled her out of work and referred her to MH-IOP.  Stressors/Triggers:  1) Loss of grandfather in July 2022  2) Job Public relations account executive) "Too much work and not enough help."  3) Non-compliancy with meds:  Pt apparently had stopped her medication soon after she d/c'd from MH-IOP; but led her therapist to believe that she was taking it.  "I didn't like the Abilify; it made me sick and it wasn't helping." Pt apparently never followed up with [facility]  for a psychiatrist either.  A: Re-oriented pt.  Pt gave verbal consent for tx, to release chart information to referred providers and to complete any forms if needed.  Pt also gave consent for attending group virtually d/t COVID-19 social distancing restrictions.  Strongly recommended support groups through [facility].  Will refer pt to a new therapist (per request of pt's current therapist) and will make appt also with a psychiatrist/NP/PA.  R:  Pt receptive.  Jeri Modena, M.Ed,CNA

## 2021-04-25 NOTE — Progress Notes (Signed)
Virtual Visit via Video Note  I connected with Gail [name] on 04/25/21 at  9:00 AM EDT by a video enabled telemedicine application and verified that I am speaking with the correct person using two identifiers.  At orientation to the IOP program, Case Manager discussed the limitations of evaluation and management by telemedicine and the availability of in person appointments. The patient expressed understanding and agreed to proceed with virtual visits throughout the duration of the program.   Location:  Patient: Patient Home Provider: Home Office   History of Present Illness: MDD with psychotic features and Panic Disorder  Observations/Objective: Check In: Case Manager checked in with all participants to review discharge dates, insurance authorizations, work-related documents and needs from the treatment team regarding medications. Client stated needs and engaged in discussion. Case Manager introduced new Client to the group with group members welcoming and starting the joining process.   Initial Therapeutic Activity: Counselor facilitated a check-in with group members to assess mood and current functioning. Client shared details of their mental health management since our last session, including challenges and successes. Counselor engaged group in discussion, covering the following topics: movement for relaxation, addressing psychosis, mental health advocacy, and addressing financial stress. Client presents with severe depression and severe anxiety. Client denied any current SI, client endorses passive HI at intake and command auditory hallucinations when in the workplace. Client denied current hallucinations during session.   Second Therapeutic Activity: Counselor introduced our guest speaker, Peggye Fothergill, [employer] Pharmacist, who shared about psychiatric medications, side effects, treatment considerations and how to communicate with medical professionals. Group Members asked  questions and shared medication concerns. Counselor prompted group members to reference a worksheet called, "Body Scan" to jot down questions and concerns about their physical health in preparation for their upcoming appointments with medical professionals. Client reports need to discuss medication side effects and needs with provider. Client may need genesite testing to rule out medications. Client would like to see a nutritonist about weight management-as anxiety causes client to be underweight. Counselor encouraged routine medical check-ups, preparing for appointments, following up with recommendations and seeking specialist if needed.   Check Out: Counselor prompted group members to identify one self-care practice or productivity activity they would like to engage in today. Client endorsed safety plan to be followed to prevent safety issues.  Assessment and Plan: Clinician recommends that Client remain in IOP treatment to better manage mental health symptoms, stabilization and to address treatment plan goals. Clinician recommends adherence to crisis/safety plan, taking medications as prescribed, and following up with medical professionals if any issues arise.    Follow Up Instructions: Clinician will send Webex link for next session. The Client was advised to call back or seek an in-person evaluation if the symptoms worsen or if the condition fails to improve as anticipated.     I provided 180 minutes of non-face-to-face time during this encounter.     Hilbert Odor, LCSW

## 2021-04-25 NOTE — Progress Notes (Signed)
Virtual Visit via Video Note  I connected with Gail [name] on 04/25/21 at  9:00 AM EDT by a video enabled telemedicine application and verified that I am speaking with the correct person using two identifiers.  Location: Patient: Office Provider: Home   I discussed the limitations of evaluation and management by telemedicine and the availability of in person appointments. The patient expressed understanding and agreed to proceed.    I discussed the assessment and treatment plan with the patient. The patient was provided an opportunity to ask questions and all were answered. The patient agreed with the plan and demonstrated an understanding of the instructions.   The patient was advised to call back or seek an in-person evaluation if the symptoms worsen or if the condition fails to improve as anticipated.  I provided 15 minutes of non-face-to-face time during this encounter.   [name], NP    Psychiatric Initial Adult Assessment   Patient Identification: Gail [name] MRN:  [id number] Date of Evaluation:  04/25/2021 Referral Source:  Chief Complaint:   Chief Complaint   Anxiety; Depression; Stress    Visit Diagnosis:    ICD-10-CM   1. Current severe episode of major depressive disorder with psychotic features, unspecified whether recurrent (HCC)  F32.3       History of Present Illness:  Gail [name] 20 year old female with a history of depression with psychotic features, generalized anxiety disorder and reported history with schizophrenia.  She reports that she recently had an evaluation with her therapist Abel Presto who she reported suggested that she returns back to intensive outpatient programming.  Denied that she followed up with psychiatry after previous discharge earlier this year.  Unclear if patient had been taking medications as directed.  Patient was initially prescribed Abilify, Lexapro and hydroxyzine.  She reports " I do not  feel as if the medications is working" she continues to endorse auditory and visual hallucinations.  Reports worsening depression.  Denies suicidal or homicidal ideations.  Patient to follow-up for labs EKG and urine pregnancy.  Will initiate Zoloft 25 mg with titration to 50 mg daily and start Seroquel 25 mg p.o. nightly.   Gail [name] denied recent inpatient admissions.  Denied substance abuse use/history. Patient reported he anxiety and panic attacks are getting worse. Stated she has not been motivated.  Poor concentration and auditory hallucinations.  Denied voices are command in nature.  Patient to start intensive outpatient programming on 04/25/2021    Associated Signs/Symptoms: Depression Symptoms:  depressed mood, difficulty concentrating, anxiety, panic attacks, (Hypo) Manic Symptoms:  Distractibility, Irritable Mood, Anxiety Symptoms:  Excessive Worry, Psychotic Symptoms:  Hallucinations: Auditory PTSD Symptoms: NA  Past Psychiatric History:   Previous Psychotropic Medications: Yes   Substance Abuse History in the last 12 months:  No.  Consequences of Substance Abuse: NA  Past Medical History:  Past Medical History:  Diagnosis Date   Anxiety    Depression    Medical history non-contributory    Vision abnormalities     Past Surgical History:  Procedure Laterality Date   WISDOM TOOTH EXTRACTION      Family Psychiatric History:   Family History:  Family History  Problem Relation Age of Onset   Rheum arthritis Mother    Lupus Mother    Obesity Mother    Hypertension Mother    Kidney disease Mother    Healthy Sister    Breast cancer Maternal Grandmother    Cancer Maternal Grandmother  breast cancer   Diabetes Maternal Grandmother    Hypertension Maternal Grandmother    Hyperlipidemia Maternal Grandmother    Breast cancer Cousin    Diabetes Father     Social History:   Social History   Socioeconomic History   Marital status: Single    Spouse name:  Not on file   Number of children: Not on file   Years of education: Not on file   Highest education level: Not on file  Occupational History   Not on file  Tobacco Use   Smoking status: Never   Smokeless tobacco: Never  Vaping Use   Vaping Use: Never used  Substance and Sexual Activity   Alcohol use: Yes    Comment: Rare   Drug use: Yes    Frequency: 3.0 times per week    Types: Marijuana   Sexual activity: Not Currently    Birth control/protection: Injection    Comment: 1st intercourse- 16, partners- 2,   Other Topics Concern   Not on file  Social History Narrative   Not on file   Social Determinants of Health   Financial Resource Strain: Not on file  Food Insecurity: Not on file  Transportation Needs: Not on file  Physical Activity: Not on file  Stress: Not on file  Social Connections: Not on file    Additional Social History:   Allergies:  Not on File  Metabolic Disorder Labs: Lab Results  Component Value Date   HGBA1C 4.8 04/28/2018   MPG 91 04/28/2018   Lab Results  Component Value Date   PROLACTIN 74.5 (H) 04/28/2018   Lab Results  Component Value Date   CHOL 125 04/28/2018   TRIG 58 04/28/2018   HDL 51 04/28/2018   CHOLHDL 2.5 04/28/2018   VLDL 12 04/28/2018   LDLCALC 62 04/28/2018   Lab Results  Component Value Date   TSH 1.96 01/02/2021    Therapeutic Level Labs: No results found for: LITHIUM No results found for: CBMZ No results found for: VALPROATE  Current Medications: Current Outpatient Medications  Medication Sig Dispense Refill   ARIPiprazole (ABILIFY) 5 MG tablet Take 1 tablet (5 mg total) by mouth daily. (Patient not taking: Reported on 04/25/2021) 30 tablet 0   famotidine (PEPCID) 20 MG tablet Take 1 tablet (20 mg total) by mouth 2 (two) times daily. (Patient not taking: Reported on 04/25/2021) 60 tablet 6   medroxyPROGESTERone (DEPO-PROVERA) 150 MG/ML injection Inject 1 mL (150 mg total) into the muscle every 3 (three) months. 1  mL 4   No current facility-administered medications for this visit.    Musculoskeletal: Strength & Muscle Tone: within normal limits Gait & Station: normal Patient leans: N/A  Psychiatric Specialty Exam: Review of Systems  There were no vitals taken for this visit.There is no height or weight on file to calculate BMI.  General Appearance: Casual  Eye Contact:  Good  Speech:  Clear and Coherent  Volume:  Normal  Mood:  Anxious and Depressed  Affect:  Congruent  Thought Process:  Coherent  Orientation:  Full (Time, Place, and Person)  Thought Content:  Logical  Suicidal Thoughts:  No  Homicidal Thoughts:  No  Memory:  Immediate;   Fair Recent;   Fair  Judgement:  Fair  Insight:  Fair  Psychomotor Activity:  Normal  Concentration:  Concentration: Good  Recall:  Good  Fund of Knowledge:Good  Language: Good  Akathisia:  No  Handed:  Right  AIMS (if indicated):  done  Assets:  Communication Skills Desire for Improvement Resilience Social Support  ADL's:  Intact  Cognition: WNL  Sleep:  Good   Screenings: AIMS    Flowsheet Row Admission (Discharged) [facility]  AIMS Total Score 0      GAD-7    Flowsheet Row Office Visit from 01/02/2021 in [facility] Office Visit from 06/07/2019 in [facility] Office Visit from 09/23/2018 in [facility] Care at [facility]  Total GAD-7 Score 9 14 6       PHQ2-9    Flowsheet Row Counselor from 04/25/2021 in [facility] Office Visit from 01/02/2021 in [facility] Counselor from 11/06/2020 in [facility]AL [facility]ALTH INT[facility] Office Visit from 06/07/2019 in [facility] Office Visit from 09/23/2018 in Primary Care at [facility] Total Score 4 2 6 3 2   PHQ-9 Total Score 15 7 22 16 7       Flowsheet Row Counselor from 04/25/2021 in BEHAVIORAL HEALTH  INTENSIVE [facility] Counselor from 11/06/2020 in [facility] Admission (Discharged) from OP [facility]  C-SSRS RISK CATEGORY Error: Question 6 not populated Error: Question 6 not populated High Risk       Assessment and Plan:  Patient to start intensive outpatient programming Patient to follow-up in office for additional lab work i.e. urine pregnancy, CBC, CMP, prolactin, A1c, TSH and lipid panel. Discontinued Abilify 5 mg p.o. daily Will consider initiating Zoloft 25 mg p.o. daily and Seroquel 25 mg p.o. nightly   11/08/2020, NP 9/7/20222:03 PM

## 2021-04-26 ENCOUNTER — Encounter (HOSPITAL_COMMUNITY): Payer: Self-pay

## 2021-04-26 ENCOUNTER — Other Ambulatory Visit (HOSPITAL_COMMUNITY): Payer: 59 | Admitting: Psychiatry

## 2021-04-26 ENCOUNTER — Other Ambulatory Visit: Payer: Self-pay

## 2021-04-26 DIAGNOSIS — F331 Major depressive disorder, recurrent, moderate: Secondary | ICD-10-CM

## 2021-04-26 DIAGNOSIS — F323 Major depressive disorder, single episode, severe with psychotic features: Secondary | ICD-10-CM | POA: Diagnosis not present

## 2021-04-26 LAB — HCG, SERUM, QUALITATIVE: hCG,Beta Subunit,Qual,Serum: NEGATIVE m[IU]/mL (ref <6)

## 2021-04-26 NOTE — Progress Notes (Signed)
Virtual Visit via Video Note  I connected with Gail [name] on 04/26/21 at  9:00 AM EDT by a video enabled telemedicine application and verified that I am speaking with the correct person using two identifiers.  LAt orientation to the IOP program, Case Manager discussed the limitations of evaluation and management by telemedicine and the availability of in person appointments. The patient expressed understanding and agreed to proceed with virtual visits throughout the duration of the program.   Location:  Patient: Patient Home Provider: Home Office   History of Present Illness: MDD  Observations/Objective: Check In: Case Manager checked in with all participants to review discharge dates, insurance authorizations, work-related documents and needs from the treatment team regarding medications. Client stated needs and engaged in discussion.   Initial Therapeutic Activity: Counselor facilitated a check-in with group members to assess mood and current functioning. Client shared details of their mental health management since our last session, including challenges and successes. Counselor engaged group in discussion, covering the following topics: movement for relaxation, addressing psychosis, mental health advocacy, and addressing financial stress. Client presents with moderate depression and moderate anxiety. Client denied any current SI/HI/psychosis.  Second Therapeutic Activity: Counselor introduced the concept of Self-Compassion to the group, prompting a discussion on initial beliefs. Group members shared their definitions, then Counselor prompted group members to take a Self-Compassion Assessment, then we discussed scores and takeaways. Counselor provided psychoeducation using Dr. Belenda Cruise Neff's research and publications on the topic. Counselor ended by walking Clients through a self-compassion exercise called Self-Soothing Self-Compassion. Client shared which technique was most  soothing for them and how they could apply skill in day to day life.   Check Out: Counselor closed program by allowing time to celebrate a graduating group member. Counselor shared reflections on progress and allow space for group members to share well wishes and encouragements with the graduating client. Counselor prompted graduating client to share takeaways, reflect on progress and final thoughts for the group. Counselor prompted group members to identify one self-care practice or productivity activity they would like to engage in today. Client endorsed safety plan to be followed to prevent safety issues.  Assessment and Plan: Clinician recommends that Client remain in IOP treatment to better manage mental health symptoms, stabilization and to address treatment plan goals. Clinician recommends adherence to crisis/safety plan, taking medications as prescribed, and following up with medical professionals if any issues arise.    Follow Up Instructions: Clinician will send Webex link for next session. The Client was advised to call back or seek an in-person evaluation if the symptoms worsen or if the condition fails to improve as anticipated.     I provided 180 minutes of non-face-to-face time during this encounter.     Hilbert Odor, LCSW

## 2021-04-27 ENCOUNTER — Encounter (HOSPITAL_COMMUNITY): Payer: Self-pay

## 2021-04-27 ENCOUNTER — Other Ambulatory Visit (HOSPITAL_COMMUNITY): Payer: 59 | Admitting: Psychiatry

## 2021-04-27 DIAGNOSIS — F323 Major depressive disorder, single episode, severe with psychotic features: Secondary | ICD-10-CM | POA: Diagnosis not present

## 2021-04-27 DIAGNOSIS — F331 Major depressive disorder, recurrent, moderate: Secondary | ICD-10-CM

## 2021-04-27 MED ORDER — QUETIAPINE FUMARATE 25 MG PO TABS: 25.0000 mg | ORAL_TABLET | Freq: Two times a day (BID) | ORAL | 0 refills | Status: DC

## 2021-04-27 MED ORDER — SERTRALINE HCL 25 MG PO TABS: ORAL_TABLET | ORAL | 0 refills | Status: DC

## 2021-04-27 NOTE — Progress Notes (Signed)
Virtual Visit via Video Note  I connected with Gail [name] on 04/27/21 at  9:00 AM EDT by a video enabled telemedicine application and verified that I am speaking with the correct person using two identifiers.  At orientation to the IOP program, Case Manager discussed the limitations of evaluation and management by telemedicine and the availability of in person appointments. The patient expressed understanding and agreed to proceed with virtual visits throughout the duration of the program.   Location:  Patient: Patient Home Provider: Home Office   History of Present Illness: MDD  Observations/Objective: Check In: Case Manager checked in with all participants to review discharge dates, insurance authorizations, work-related documents and needs from the treatment team regarding medications. Client stated needs and engaged in discussion.   Initial Therapeutic Activity: Counselor facilitated a check-in with group members to assess mood and current functioning. Client shared details of their mental health management since our last session, including challenges and successes. Counselor engaged group in discussion, covering the following topics: insomnia, manic episodes, ADLs, cleaning spaces after depressive episodes, and engaging support system in recovery. Client presents with moderate depression and moderate anxiety. Client denied any current SI/HI/psychosis. Recent occurences of auditory hallucinations. Client reports they are subsiding.   Second Therapeutic Activity: Counselor opened conversation for group members to share their thoughts and beliefs on forgiveness. After group discussion, Counselor presented 5 stages of forgiveness and myths about forgiveness. Group members gave feedback and discussed application for self and others. Counselor shared resources from [employer] and provided group with additional books and workbooks for [employer]. Counselor introduced 2  inspirational videos on self-love and forgiveness, with group identifying takeaways and processing concepts. Client engaged well in conversation and is making progress towards goals.   Check Out: Counselor closed program by allowing time to celebrate a graduating group member. Counselor shared reflections on progress and allow space for group members to share well wishes and encouragements with the graduating client. Counselor prompted graduating client to share takeaways, reflect on progress and final thoughts for the group. Counselor prompted group members to identify one self-care practice or productivity activity they would like to engage in today. Client endorsed safety plan to be followed to prevent safety issues.  Assessment and Plan: Clinician recommends that Client remain in IOP treatment to better manage mental health symptoms, stabilization and to address treatment plan goals. Clinician recommends adherence to crisis/safety plan, taking medications as prescribed, and following up with medical professionals if any issues arise.    Follow Up Instructions: Clinician will send Webex link for next session. The Client was advised to call back or seek an in-person evaluation if the symptoms worsen or if the condition fails to improve as anticipated.     I provided 180 minutes of non-face-to-face time during this encounter.     Hilbert Odor, LCSW

## 2021-04-30 ENCOUNTER — Other Ambulatory Visit: Payer: Self-pay

## 2021-04-30 ENCOUNTER — Encounter (HOSPITAL_COMMUNITY): Payer: Self-pay | Admitting: Psychiatry

## 2021-04-30 ENCOUNTER — Telehealth (HOSPITAL_COMMUNITY): Payer: Self-pay | Admitting: Psychiatry

## 2021-04-30 ENCOUNTER — Other Ambulatory Visit (HOSPITAL_COMMUNITY): Payer: 59 | Admitting: Psychiatry

## 2021-04-30 DIAGNOSIS — F323 Major depressive disorder, single episode, severe with psychotic features: Secondary | ICD-10-CM | POA: Diagnosis not present

## 2021-04-30 DIAGNOSIS — F331 Major depressive disorder, recurrent, moderate: Secondary | ICD-10-CM

## 2021-04-30 NOTE — Telephone Encounter (Signed)
D:  Pt was apparently having difficulties this morning in MH-IOP d/t her weekend.  Pt was mentioning that she was thinking about admitting herself.  A:  Placed call to discuss with pt.  Pt states she's feeling better now; but plans to discuss possibility of admitting herself to the [facility] on Wednesday (05-02-21).  "I want to see if they will work with me."  Pt currently denies SI.  Discussed safety options at length with pt.  Inform Hillery Jacks, NP.  R:  Pt receptive.

## 2021-04-30 NOTE — Progress Notes (Signed)
Virtual Visit via Video Note  I connected with Gail [name] on 04/30/21 at  9:00 AM EDT by a video enabled telemedicine application and verified that I am speaking with the correct person using two identifiers.  At orientation to the IOP program, Case Manager discussed the limitations of evaluation and management by telemedicine and the availability of in person appointments. The patient expressed understanding and agreed to proceed with virtual visits throughout the duration of the program.   Location:  Patient: Patient Home Provider: Home Office   History of Present Illness: MDD  Observations/Objective: Check In: Case Manager checked in with all participants to review discharge dates, insurance authorizations, work-related documents and needs from the treatment team regarding medications. Client stated needs and engaged in discussion. Case Manager introduced a new Client to the group, with group members welcoming and starting the joining process.   Initial Therapeutic Activity: Counselor facilitated a check-in with group members to assess mood and current functioning. Client shared details of their mental health management since our last session, including challenges and successes. Counselor engaged group in discussion, covering the following topics: community resources, Mental Health programs, assertive communication, addressing physical health needs, and applying coping skills. Client presents with severe depression and severe anxiety. Client reports that she has a recent history of SI and HI-command auditory hallucinations related to co-workers. Client is currently on leave from work, with no one in immediate danger. Client depression regressing over weekend with additional school and familial stressors exacerbating symptoms of hopelessness, worthlessness, financial stressors, irritability, agitation and negative intrusive thoughts.  Counselor recommending inpatient behavioral  health. Client reports physical health symptoms and inability for consistency with medications. Client's mother to take her to be assessed after talking with [facility] about impact of medical withdraw.   Second Therapeutic Activity: Counselor engaged group in initial thoughts on boundaries and boundary setting, with group members sharing challenges they have with setting and keeping them, and the impacts on their mental health. Counselor shared information and resources from [employer], then prompted group to take the Boundaries Quiz. Group members shared their results, with all group members scoring in the lowest range. Counselor then presented psychoeducation on the 3 types of boundary styles and 8 types of boundaries. Group members engaged appropriately in discussion giving personal examples of their own boundary setting needs.   Check Out: Counselor prompted group members to identify one self-care practice or productivity activity they would like to engage in today. Client endorsed safety plan to be followed to prevent safety issues.  Assessment and Plan: Clinician recommends that Client remain in IOP treatment to better manage mental health symptoms, stabilization and to address treatment plan goals until admitted to inpatient behavorial health. Clinician recommends adherence to crisis/safety plan, taking medications as prescribed, and following up with medical professionals if any issues arise.    Follow Up Instructions: Clinician will send Webex link for next session. The Client was advised to call back or seek an in-person evaluation if the symptoms worsen or if the condition fails to improve as anticipated.     I provided 180 minutes of non-face-to-face time during this encounter.     Hilbert Odor, LCSW

## 2021-05-01 ENCOUNTER — Other Ambulatory Visit (HOSPITAL_COMMUNITY): Payer: 59 | Admitting: Psychiatry

## 2021-05-01 ENCOUNTER — Other Ambulatory Visit: Payer: Self-pay

## 2021-05-01 DIAGNOSIS — F323 Major depressive disorder, single episode, severe with psychotic features: Secondary | ICD-10-CM | POA: Diagnosis not present

## 2021-05-01 DIAGNOSIS — F331 Major depressive disorder, recurrent, moderate: Secondary | ICD-10-CM

## 2021-05-01 DIAGNOSIS — F410 Panic disorder [episodic paroxysmal anxiety] without agoraphobia: Secondary | ICD-10-CM

## 2021-05-02 ENCOUNTER — Other Ambulatory Visit (HOSPITAL_COMMUNITY): Payer: 59 | Admitting: Psychiatry

## 2021-05-02 ENCOUNTER — Other Ambulatory Visit: Payer: Self-pay

## 2021-05-02 DIAGNOSIS — F323 Major depressive disorder, single episode, severe with psychotic features: Secondary | ICD-10-CM | POA: Diagnosis not present

## 2021-05-02 DIAGNOSIS — F410 Panic disorder [episodic paroxysmal anxiety] without agoraphobia: Secondary | ICD-10-CM

## 2021-05-03 ENCOUNTER — Encounter (HOSPITAL_COMMUNITY): Payer: Self-pay

## 2021-05-03 ENCOUNTER — Other Ambulatory Visit: Payer: Self-pay

## 2021-05-03 ENCOUNTER — Other Ambulatory Visit (HOSPITAL_COMMUNITY): Payer: 59 | Admitting: Psychiatry

## 2021-05-03 DIAGNOSIS — F323 Major depressive disorder, single episode, severe with psychotic features: Secondary | ICD-10-CM

## 2021-05-03 DIAGNOSIS — F410 Panic disorder [episodic paroxysmal anxiety] without agoraphobia: Secondary | ICD-10-CM

## 2021-05-03 NOTE — Progress Notes (Signed)
Virtual Visit via Video Note  I connected with Gail [name] on 05/02/21 at  9:00 AM EDT by a video enabled telemedicine application and verified that I am speaking with the correct person using two identifiers.  At orientation to the IOP program, Case Manager discussed the limitations of evaluation and management by telemedicine and the availability of in person appointments. The patient expressed understanding and agreed to proceed with virtual visits throughout the duration of the program.   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 Client stated needs and engaged in discussion. Case Manager introduced a new Client to the group, with group members welcoming and starting the joining process.   Initial Therapeutic Activity: Counselor facilitated a check-in with group members to assess mood and current functioning. Client shared details of their mental health management since our last session, including challenges and successes. Counselor engaged group in discussion, covering the following topics: boundary application skills, common misdiagnoses, sleep issues, grief reactions, mood tracking apps, menstrual cycles and how they impact mood. Client presents with severe depression and severe anxiety. Client denied any current SI/HI, however Client continues to hear voices "chattering louder" in her mind. Client reports having paranoia-feelings of being watched. Client unable to eat and keep food down. Client with recent HI towards supervisor at job, currently written out of work.   Second Therapeutic Activity: Counselor introduced the [employer] of the Brain by Dr. Dairl Ponder to help group members better  understand how their brains are affected by external and internal stimulus. Counselor provided additional psychoeducation on how mental illness occurs in the brain and coping strategies for calming the alarm system in the brain. Counselor shared 2 videos and provided a few articles.    Check Out: Counselor prompted group members to identify one self-care practice or productivity activity they would like to engage in today. Client endorsed safety plan to be followed to prevent safety issues.  Assessment and Plan: Clinician recommends that Client remain in IOP treatment to better manage mental health symptoms, stabilization and to address treatment plan goals. Clinician recommends adherence to crisis/safety plan, taking medications as prescribed, and following up with medical professionals if any issues arise.    Follow Up Instructions: Clinician will send Webex link for next session. The Client was advised to call back or seek an in-person evaluation if the symptoms worsen or if the condition fails to improve as anticipated.     I provided 180 minutes of non-face-to-face time during this encounter.     Hilbert Odor, LCSW

## 2021-05-03 NOTE — Progress Notes (Addendum)
Virtual Visit via Video Note  I connected with Ledora Delker [name] on 05/01/21 at  9:00 AM EDT by a video enabled telemedicine application and verified that I am speaking with the correct person using two identifiers.  At orientation to the IOP program, Case Manager discussed the limitations of evaluation and management by telemedicine and the availability of in person appointments. The patient expressed understanding and agreed to proceed with virtual visits throughout the duration of the program.   Location:  Patient: Patient Home Provider: Home Office   History of Present Illness: MDD and Panic Disorder  Observations/Objective: Check In: Case Manager checked in with all participants to review discharge dates, insurance authorizations, work-related documents and needs from the treatment team regarding medications. Client stated needs and engaged in discussion. Case Manager introduced a new Client to the group, with group members welcoming and starting the joining process.   Initial Therapeutic Activity: Counselor facilitated a check-in with group members to assess mood and current functioning. Client shared details of their mental health management since our last session, including challenges and successes. Counselor engaged group in discussion, covering the following topics: criteria for various diagnoses, managing intense emotions, and communicating needs with support system. Client presents with severe depression and severe anxiety. Client denied any current SI/HI, with recent hallucinations with auditory commands to harm others. Client reports intrusive negative thoughts, hopelessness and worthlessness.   Second Therapeutic Activity: Counselor introduced [name], [name] [employer] to present information and discussion on Grief and Loss. Group members engaged in discussion, sharing how grief impacts them, what comforts them, what emotions are felt, labeling losses, etc. After  guest speaker logged off, Counselor prompted group to spend 10-15 minutes journaling to process personal grief and loss situations. Counselor processed entries with group and client's identified areas for additional processing in individual therapy. Client noted grief connected with passing of grandfather and current life changes.   Check Out: Counselor closed program by allowing time to celebrate a graduating group member. Counselor shared reflections on progress and allow space for group members to share well wishes and encouragements with the graduating client. Counselor prompted graduating client to share takeaways, reflect on progress and final thoughts for the group. Counselor prompted group members to identify one self-care practice or productivity activity they would like to engage in today. Client endorsed safety plan to be followed to prevent safety issues.  Assessment and Plan: Clinician recommends that Client remain in IOP treatment to better manage mental health symptoms, stabilization and to address treatment plan goals. Clinician recommends adherence to crisis/safety plan, taking medications as prescribed, and following up with medical professionals if any issues arise.    Follow Up Instructions: Clinician will send Webex link for next session. The Client was advised to call back or seek an in-person evaluation if the symptoms worsen or if the condition fails to improve as anticipated.     I provided 180 minutes of non-face-to-face time during this encounter.     Hilbert Odor, LCSW

## 2021-05-03 NOTE — Progress Notes (Signed)
Virtual Visit via Video Note  I connected with Keyle Doby [name] on 05/03/21 at  9:00 AM EDT by a video enabled telemedicine application and verified that I am speaking with the correct person using two identifiers.  At orientation to the IOP program, Case Manager discussed the limitations of evaluation and management by telemedicine and the availability of in person appointments. The patient expressed understanding and agreed to proceed with virtual visits throughout the duration of the program.   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 Client stated needs and engaged in discussion.   Initial Therapeutic Activity: Counselor facilitated a check-in with group members to assess mood and current functioning. Client shared details of their mental health management since our last session, including challenges and successes. Counselor engaged group in discussion, covering the following topics: applying coping skills, managing medications, advocating for MH at school and on the job, educating others on mental health. Client presents with severe depression and severe anxiety. Client denied any current SI/HI, however Client continues to hear voices "chattering louder" in her mind. Client reports having paranoia-feelings of being watched. Client unable to eat and keep food down. Client with recent HI towards supervisor at job, currently written out of work.   Second Therapeutic Activity: Counselor engaged group in a guided imagery, called Finding Your Authentic Self. This practice included deep breathing, progressive muscle relaxation, positive affirmations and visualizations. Group members engaged in activity  sharing feedback from experience as being relaxing, energizing, calming and expressed a desire to practice skill more in the future. Counselor provided group members with link for more practices.    Check Out: Counselor closed program by allowing time to celebrate a graduating group member. Counselor shared reflections on progress and allow space for group members to share well wishes and encouragements with the graduating client. Counselor prompted graduating client to share takeaways, reflect on progress and final thoughts for the group. Counselor prompted group members to identify one self-care practice or productivity activity they would like to engage in today. Client endorsed safety plan to be followed to prevent safety issues.  Assessment and Plan: Clinician recommends that Client remain in IOP treatment to better manage mental health symptoms, stabilization and to address treatment plan goals. Clinician recommends adherence to crisis/safety plan, taking medications as prescribed, and following up with medical professionals if any issues arise.    Follow Up Instructions: Clinician will send Webex link for next session. The Client was advised to call back or seek an in-person evaluation if the symptoms worsen or if the condition fails to improve as anticipated.     I provided 180 minutes of non-face-to-face time during this encounter.     Hilbert Odor, LCSW

## 2021-05-04 ENCOUNTER — Encounter (HOSPITAL_COMMUNITY): Payer: Self-pay

## 2021-05-04 ENCOUNTER — Other Ambulatory Visit (HOSPITAL_COMMUNITY)
Admission: EM | Admit: 2021-05-04 | Discharge: 2021-05-07 | Disposition: A | Discharge status: Home / Self Care | Payer: 59 | Attending: Psychiatry | Admitting: Psychiatry

## 2021-05-04 ENCOUNTER — Other Ambulatory Visit: Payer: Self-pay

## 2021-05-04 ENCOUNTER — Other Ambulatory Visit (HOSPITAL_COMMUNITY): Payer: 59 | Admitting: Psychiatry

## 2021-05-04 DIAGNOSIS — R45851 Suicidal ideations: Secondary | ICD-10-CM | POA: Insufficient documentation

## 2021-05-04 DIAGNOSIS — F333 Major depressive disorder, recurrent, severe with psychotic symptoms: Secondary | ICD-10-CM | POA: Insufficient documentation

## 2021-05-04 DIAGNOSIS — Z20822 Contact with and (suspected) exposure to covid-19: Secondary | ICD-10-CM | POA: Insufficient documentation

## 2021-05-04 DIAGNOSIS — F323 Major depressive disorder, single episode, severe with psychotic features: Secondary | ICD-10-CM

## 2021-05-04 DIAGNOSIS — Z9151 Personal history of suicidal behavior: Secondary | ICD-10-CM | POA: Insufficient documentation

## 2021-05-04 MED ORDER — ACETAMINOPHEN 325 MG PO TABS: 650.0000 mg | ORAL_TABLET | Freq: Four times a day (QID) | ORAL | Status: DC | PRN

## 2021-05-04 MED ORDER — ALUM & MAG HYDROXIDE-SIMETH 200-200-20 MG/5ML PO SUSP: 30.0000 mL | ORAL | Status: DC | PRN

## 2021-05-04 MED ORDER — HYDROXYZINE HCL 25 MG PO TABS
25.0000 mg | ORAL_TABLET | Freq: Three times a day (TID) | ORAL | Status: DC | PRN
Start: 2021-05-04 — End: 2021-05-07

## 2021-05-04 MED ORDER — MAGNESIUM HYDROXIDE 400 MG/5ML PO SUSP: 30.0000 mL | Freq: Every day | ORAL | Status: DC | PRN

## 2021-05-04 MED ORDER — TRAZODONE HCL 50 MG PO TABS: 50.0000 mg | ORAL_TABLET | Freq: Every evening | ORAL | Status: DC | PRN

## 2021-05-04 NOTE — Progress Notes (Signed)
Virtual Visit via Video Note  I connected with Gail [name] on 05/04/21 at  9:00 AM EDT by a video enabled telemedicine application and verified that I am speaking with the correct person using two identifiers.  At orientation to the IOP program, Case Manager discussed the limitations of evaluation and management by telemedicine and the availability of in person appointments. The patient expressed understanding and agreed to proceed with virtual visits throughout the duration of the program.   Location:  Patient: Patient Home Provider: Home Office   History of Present Illness: MDD with psychotic features  Observations/Objective: Check In: Case Manager checked in with all participants to review discharge dates, insurance authorizations, work-related documents and needs from the treatment team regarding medications. Client stated needs and engaged in discussion.   Initial Therapeutic Activity: Counselor facilitated a check-in with group members to assess mood and current functioning. Client shared details of their mental health management since our last session, including challenges and successes. Counselor engaged group in discussion, covering the following topics: revising and updating mental health crisis and safety plans. Client presents with severe depression and severe anxiety. Client denied any current SI/HI, however Client continues to hear voices "chattering louder" in her mind. Client reports having paranoia-feelings of being watched. Client unable to eat and keep food down. Client with recent HI towards supervisor at job, currently written out of work. PROGRAM IS RECOMMENDING CLIENT BE ASSESSED AND AD[facility]ITTED [facility] OR [facility], as symptoms are not improving. Client mental and physical health declining. Client needs to have medications assessed, symptoms and side effects observed. RO/ onset of Schizophrenia or Schizoaffective DO. Additional concerns about  hormone levels/excessive sweating.  Second Therapeutic Activity: Counselor prompted group members to share coping skills and resources they personally use to manage/stabilize/improve their mental health. Group members shared useful apps, websites, sensory items, books and ideas with group. Client participated in sharing and discussion. Counselor provided group with an exhaustive list of community, state and national mental health, substance abuse and basic need resources. Counselor prompted group members to save numbers in their phones and to bookmark links for future use.    Check Out: Counselor closed program by allowing time to celebrate a graduating group member. Counselor shared reflections on progress and allow space for group members to share well wishes and encouragements with the graduating client. Counselor prompted graduating client to share takeaways, reflect on progress and final thoughts for the group. Counselor prompted group members to identify one self-care practice or productivity activity they would like to engage in today. Client endorsed safety plan to be followed to prevent safety issues.  Assessment and Plan: Clinician recommends that Client remain in IOP treatment if not admitted to [facility] or [facility] to better manage mental health symptoms, stabilization and to address treatment plan goals. Clinician recommends adherence to crisis/safety plan, taking medications as prescribed, and following up with medical professionals if any issues arise.    Follow Up Instructions: Clinician will send Webex link for next session.NP for program to communicate urgency of care with [facility] staff. The Client was advised to call back or seek an in-person evaluation immediately or if the condition fails to improve as anticipated.     I provided 180 minutes of non-face-to-face time during this encounter.     Hilbert Odor, LCSW
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
## 2021-05-05 ENCOUNTER — Other Ambulatory Visit: Payer: Self-pay

## 2021-05-05 DIAGNOSIS — F333 Major depressive disorder, recurrent, severe with psychotic symptoms: Secondary | ICD-10-CM | POA: Diagnosis present

## 2021-05-05 DIAGNOSIS — R45851 Suicidal ideations: Secondary | ICD-10-CM | POA: Diagnosis not present

## 2021-05-05 DIAGNOSIS — Z20822 Contact with and (suspected) exposure to covid-19: Secondary | ICD-10-CM | POA: Diagnosis not present

## 2021-05-05 DIAGNOSIS — Z9151 Personal history of suicidal behavior: Secondary | ICD-10-CM | POA: Diagnosis not present

## 2021-05-05 LAB — CBC WITH DIFFERENTIAL/PLATELET
Abs Immature Granulocytes: 0.02 10*3/uL (ref 0.00–0.07)
Basophils Absolute: 0 10*3/uL (ref 0.0–0.1)
Basophils Relative: 0 %
Eosinophils Absolute: 0.2 10*3/uL (ref 0.0–0.5)
Eosinophils Relative: 3 %
HCT: 40.2 % (ref 36.0–46.0)
Hemoglobin: 13.1 g/dL (ref 12.0–15.0)
Immature Granulocytes: 0 %
Lymphocytes Relative: 47 %
Lymphs Abs: 3.1 10*3/uL (ref 0.7–4.0)
MCH: 30.6 pg (ref 26.0–34.0)
MCHC: 32.6 g/dL (ref 30.0–36.0)
MCV: 93.9 fL (ref 80.0–100.0)
Monocytes Absolute: 0.4 10*3/uL (ref 0.1–1.0)
Monocytes Relative: 5 %
Neutro Abs: 3.1 10*3/uL (ref 1.7–7.7)
Neutrophils Relative %: 45 %
Platelets: 342 10*3/uL (ref 150–400)
RBC: 4.28 MIL/uL (ref 3.87–5.11)
RDW: 12.1 % (ref 11.5–15.5)
WBC: 6.8 10*3/uL (ref 4.0–10.5)
nRBC: 0 % (ref 0.0–0.2)

## 2021-05-05 LAB — COMPREHENSIVE METABOLIC PANEL
ALT: 17 U/L (ref 0–44)
AST: 17 U/L (ref 15–41)
Albumin: 3.8 g/dL (ref 3.5–5.0)
Alkaline Phosphatase: 51 U/L (ref 38–126)
Anion gap: 7 (ref 5–15)
BUN: 7 mg/dL (ref 6–20)
CO2: 22 mmol/L (ref 22–32)
Calcium: 9.3 mg/dL (ref 8.9–10.3)
Chloride: 107 mmol/L (ref 98–111)
Creatinine, Ser: 0.69 mg/dL (ref 0.44–1.00)
GFR, Estimated: >60 mL/min (ref >60)
Glucose, Bld: 83 mg/dL (ref 70–99)
Potassium: 3.6 mmol/L (ref 3.5–5.1)
Sodium: 136 mmol/L (ref 135–145)
Total Bilirubin: 1.3 mg/dL — ABNORMAL HIGH (ref 0.3–1.2)
Total Protein: 6.8 g/dL (ref 6.5–8.1)

## 2021-05-05 LAB — POCT URINE DRUG SCREEN - MANUAL ENTRY (I-SCREEN)
POC Amphetamine UR: NOT DETECTED
POC Buprenorphine (BUP): NOT DETECTED
POC Cocaine UR: NOT DETECTED
POC Marijuana UR: NOT DETECTED
POC Methadone UR: NOT DETECTED
POC Methamphetamine UR: NOT DETECTED
POC Morphine: NOT DETECTED
POC Oxazepam (BZO): NOT DETECTED
POC Oxycodone UR: NOT DETECTED
POC Secobarbital (BAR): NOT DETECTED

## 2021-05-05 LAB — LIPID PANEL
Cholesterol: 121 mg/dL (ref 0–200)
HDL: 52 mg/dL (ref >40)
LDL Cholesterol: 58 mg/dL (ref 0–99)
Total CHOL/HDL Ratio: 2.3 RATIO
Triglycerides: 57 mg/dL (ref <150)
VLDL: 11 mg/dL (ref 0–40)

## 2021-05-05 LAB — TSH: TSH: 2.727 u[IU]/mL (ref 0.350–4.500)

## 2021-05-05 LAB — POC SARS CORONAVIRUS 2 AG: SARSCOV2ONAVIRUS 2 AG: NEGATIVE

## 2021-05-05 LAB — HEMOGLOBIN A1C
Hgb A1c MFr Bld: 4.5 % — ABNORMAL LOW (ref 4.8–5.6)
Mean Plasma Glucose: 82.45 mg/dL

## 2021-05-05 LAB — RESP PANEL BY RT-PCR (FLU A&B, COVID) ARPGX2
Influenza A by PCR: NEGATIVE
Influenza B by PCR: NEGATIVE
SARS Coronavirus 2 by RT PCR: NEGATIVE

## 2021-05-05 LAB — POCT PREGNANCY, URINE: Preg Test, Ur: NEGATIVE

## 2021-05-05 LAB — POC SARS CORONAVIRUS 2 AG -  ED: SARS Coronavirus 2 Ag: NEGATIVE

## 2021-05-05 MED ORDER — QUETIAPINE FUMARATE 25 MG PO TABS
25.0000 mg | ORAL_TABLET | Freq: Two times a day (BID) | ORAL | Status: DC
  Administered 2021-05-05 – 2021-05-06 (×3): 25 mg via ORAL
  Filled 2021-05-05 (×3): qty 1

## 2021-05-05 MED ORDER — SERTRALINE HCL 25 MG PO TABS
25.0000 mg | ORAL_TABLET | Freq: Every day | ORAL | Status: DC
  Administered 2021-05-05 – 2021-05-06 (×2): 25 mg via ORAL
  Filled 2021-05-05 (×2): qty 1

## 2021-05-05 NOTE — ED Notes (Signed)
Pt sleeping in room in no acute distress. RR even and unlabored. Safety maintained.

## 2021-05-05 NOTE — ED Notes (Signed)
[facility] called to pick up labs 

## 2021-05-05 NOTE — ED Notes (Signed)
Pt given breakfast.

## 2021-05-05 NOTE — ED Notes (Signed)
Pt sleeping in no acute distress. RR even and unlabored. Safety maintained. 

## 2021-05-05 NOTE — ED Notes (Signed)
Pt eating dinner

## 2021-05-05 NOTE — ED Provider Notes (Addendum)
Behavioral Health Admission H&P [facility] & OBS)  Date: 05/05/21 Patient Name: Gail [name] MRN: [id number] Chief Complaint:  Chief Complaint  Patient presents with  . Hallucinations      Diagnoses:  Final diagnoses:  MDD (major depressive disorder), recurrent, severe, with psychosis (HCC)    HPI: Gail [name] is a 19 patient with psychiatric history of anxiety and recurrent depression with psychotic features. Patient presented to [facility] for mental health evaluation. Patient is accompanied by her mother Mignon Pine  [phone]). Patient consented to her mother rremaining in the room during assessment.   Patient shares that she feels her mental health has declined since the death of her grandfather in Mar 15, 2021. She reports feeling very anxious, depressed, suicidal, homicidal, and experiencing auditory and visual hallucination. She reports that she recently took a leave of absence from work due to increased stress. She report that is experiecing depressive symptoms of hopelessness, worthlessness, increased anxiety/panic attacks, restlessness, crying spells, poor concentration, poor sleep, and isolations.   Patient shares "yesterday I was planning to cut my throat." She reports history of suicidal ideations with an attempt in 2019. She denies history of NSSIB. She reports she is having homicidal thoughts towards her elder sister and multiple co-workers. She reports "I would like to strangle them or cut their throats with a knife if I have the chance." Patient reports that she came to [facility] today because she had intent on committing homicide. She endorses auditory hallucination of "voice that sound like noise, I can't it make out." She endorses visual hallucination of a shadow figure and a figure that hovers over her.   She endorses vaping CBD 1-2 times per week. She denies all other substance use. She denies all medical complaint including chest pain, SOB,  acute distress, headache, dizziness, or AMS.   PHQ 2-9:  Flowsheet Row ED from 05/04/2021 in [facility] Counselor from 04/25/2021 in BE[facility]AVIORAL [facility]ALTH INTENS[facility] Office Visit from 01/02/2021 in [facility]  Thoughts that you would be better off dead, or of hurting yourself in some way Several days Not at all Not at all  PHQ-9 Total Score 16 15 7        Flowsheet Row ED from 05/04/2021 in [facility] Counselor from 04/25/2021 in [facility] INTENS[facility] Counselor from 11/06/2020 in BEHAVIORAL HEALTH INTENSIVE [facility]YCH  C-SSRS RISK CATEGORY Low Risk Error: Question 6 not populated Error: Question 6 not populated        Total Time spent with patient: 20 minutes  Musculoskeletal  Strength & Muscle Tone: within normal limits Gait & Station: normal Patient leans: Right  Psychiatric Specialty Exam  Presentation General Appearance: Appropriate for Environment  Eye Contact:Good  Speech:Clear and Coherent  Speech Volume:Decreased  Handedness:Right   Mood and Affect  Mood:Anxious; Depressed  Affect:Congruent   Thought Process  Thought Processes:Coherent  Descriptions of Associations:Intact  Orientation:Full (Time, Place and Person)  Thought Content:Logical  Diagnosis of Schizophrenia or Schizoaffective disorder in past: Yes  Duration of Psychotic Symptoms: Greater than six months  Hallucinations:Hallucinations: Auditory Description of Auditory Hallucinations: "noises that i can't make out"  Ideas of Reference:None  Suicidal Thoughts:Suicidal Thoughts: Yes, Active SI Active Intent and/or Plan: With Plan  Homicidal Thoughts:Homicidal Thoughts: Yes, Active HI Active Intent and/or Plan: With Plan; With Intent   Sensorium  Memory:Immediate Good; Recent Good; Remote Good  Judgment:Fair  Insight:Good   Executive Functions  Concentration:Fair  Attention  Span:Good  Recall:Good  Progress Energy  of Knowledge:No data recorded Language:Good   Psychomotor Activity  Psychomotor Activity:Psychomotor Activity: Normal   Assets  Assets:Communication Skills; Desire for Improvement; Housing; Health and safety inspector; Physical Health; Social Support; Transportation   Sleep  Sleep:Sleep: Fair Number of Hours of Sleep: 5   Nutritional Assessment (For OBS and [facility]BC admissions only) Has the patient had a weight loss or gain of 10 pounds or more in the last 3 months?: No Has the patient had a decrease in food intake/or appetite?: Yes Does the patient have dental problems?: No Does the patient have eating habits or behaviors that may be indicators of an eating disorder including binging or inducing vomiting?: No Has the patient recently lost weight without trying?: 0 Has the patient been eating poorly because of a decreased appetite?: 1 Malnutrition Screening Tool Score: 1   Physical Exam Vitals and nursing note reviewed.  Constitutional:      General: She is not in acute distress.    Appearance: She is well-developed. She is not ill-appearing, toxic-appearing or diaphoretic.  HENT:     Head: Normocephalic.  Eyes:     Conjunctiva/sclera: Conjunctivae normal.  Cardiovascular:     Rate and Rhythm: Normal rate.     Heart sounds: No murmur heard. Pulmonary:     Effort: Pulmonary effort is normal. No respiratory distress.     Breath sounds: Normal breath sounds.  Abdominal:     Palpations: Abdomen is soft.     Tenderness: There is no abdominal tenderness.  Musculoskeletal:        General: Normal range of motion.     Cervical back: Neck supple.  Skin:    General: Skin is warm.  Neurological:     Mental Status: She is alert and oriented to person, place, and time.  Psychiatric:        Attention and Perception: She is attentive. She perceives auditory and visual hallucinations.        Mood and Affect: Mood is depressed. Affect is blunt.         Speech: Speech normal.        Behavior: Behavior is cooperative.        Thought Content: Thought content is not paranoid. Thought content includes homicidal and suicidal ideation. Thought content includes homicidal and suicidal plan.        Cognition and Memory: Cognition normal.   Review of Systems  Constitutional: Negative.   HENT: Negative.    Eyes: Negative.   Respiratory: Negative.    Cardiovascular: Negative.   Gastrointestinal: Negative.   Genitourinary: Negative.   Musculoskeletal: Negative.   Skin: Negative.   Neurological: Negative.   Endo/Heme/Allergies: Negative.   Psychiatric/Behavioral:  Positive for depression, hallucinations and suicidal ideas. Negative for substance abuse. The patient is nervous/anxious.    Blood pressure 115/79, pulse 82, temperature 98.4 F (36.9 C), resp. rate 16, SpO2 97 %. There is no height or weight on file to calculate BMI.  Past Psychiatric History: MDD with psychosis, anxiety,    Is the patient at risk to self? Yes  Has the patient been a risk to self in the past 6 months? No .    Has the patient been a risk to self within the distant past? Yes   Is the patient a risk to others? Yes   Has the patient been a risk to others in the past 6 months? No   Has the patient been a risk to others within the distant past? No   Past Medical History:  Past Medical History:  Diagnosis Date  . Anxiety   . Depression   . Medical history non-contributory   . Vision abnormalities     Past Surgical History:  Procedure Laterality Date  . WISDOM TOOTH EXTRACTION      Family History:  Family History  Problem Relation Age of Onset  . Rheum arthritis Mother   . Lupus Mother   . Obesity Mother   . Hypertension Mother   . Kidney disease Mother   . Healthy Sister   . Breast cancer Maternal Grandmother   . Cancer Maternal Grandmother        breast cancer  . Diabetes Maternal Grandmother   . Hypertension Maternal Grandmother   . Hyperlipidemia  Maternal Grandmother   . Breast cancer Cousin   . Diabetes Father     Social History:  Social History   Socioeconomic History  . Marital status: Single    Spouse name: Not on file  . Number of children: Not on file  . Years of education: Not on file  . Highest education level: Not on file  Occupational History  . Not on file  Tobacco Use  . Smoking status: Never  . Smokeless tobacco: Never  Vaping Use  . Vaping Use: Never used  Substance and Sexual Activity  . Alcohol use: Yes    Comment: Rare  . Drug use: Yes    Frequency: 3.0 times per week    Types: Marijuana  . Sexual activity: Not Currently    Birth control/protection: Injection    Comment: 1st intercourse- 16, partners- 2,   Other Topics Concern  . Not on file  Social History Narrative  . Not on file   Social Determinants of Health   Financial Resource Strain: Not on file  Food Insecurity: Not on file  Transportation Needs: Not on file  Physical Activity: Not on file  Stress: Not on file  Social Connections: Not on file  Intimate Partner Violence: Not on file    SDOH:  SDOH Screenings   Alcohol Screen: Not on file  Depression (PHQ2-9): Medium Risk  . PHQ-2 Score: 16  Financial Resource Strain: Not on file  Food Insecurity: Not on file  Housing: Not on file  Physical Activity: Not on file  Social Connections: Not on file  Stress: Not on file  Tobacco Use: Low Risk   . Smoking Tobacco Use: Never  . Smokeless Tobacco Use: Never  Transportation Needs: Not on file    Last Labs:  Admission on 05/04/2021  Component Date Value Ref Range Status  . SARS Coronavirus 2 by RT PCR 05/05/2021 NEGATIVE  NEGATIVE Final   Comment: (NOTE) SARS-CoV-2 target nucleic acids are NOT DETECTED.  The SARS-CoV-2 RNA is generally detectable in upper respiratory specimens during the acute phase of infection. The lowest concentration of SARS-CoV-2 viral copies this assay can detect is 138 copies/mL. A negative result  does not preclude SARS-Cov-2 infection and should not be used as the sole basis for treatment or other patient management decisions. A negative result may occur with  improper specimen collection/handling, submission of specimen other than nasopharyngeal swab, presence of viral mutation(s) within the areas targeted by this assay, and inadequate number of viral copies(<138 copies/mL). A negative result must be combined with clinical observations, patient history, and epidemiological information. The expected result is Negative.  Fact Sheet for Patients:  BloggerCourse.com  Fact Sheet for Healthcare Providers:  SeriousBroker.it  This test is no  t yet approved or cleared by the [place] and  has been authorized for detection and/or diagnosis of SARS-CoV-2 by FDA under an Emergency Use Authorization (EUA). This EUA will remain  in effect (meaning this test can be used) for the duration of the COVID-19 declaration under Section 564(b)(1) of the Act, 21 U.S.C.section 360bbb-3(b)(1), unless the authorization is terminated  or revoked sooner.      . Influenza A by PCR 05/05/2021 NEGATIVE  NEGATIVE Final  . Influenza B by PCR 05/05/2021 NEGATIVE  NEGATIVE Final   Comment: (NOTE) The Xpert Xpress SARS-CoV-2/FLU/RSV plus assay is intended as an aid in the diagnosis of influenza from Nasopharyngeal swab specimens and should not be used as a sole basis for treatment. Nasal washings and aspirates are unacceptable for Xpert Xpress SARS-CoV-2/FLU/RSV testing.  Fact Sheet for Patients: BloggerCourse.com  Fact Sheet for Healthcare Providers: SeriousBroker.it  This test is not yet approved or cleared by the [place] FDA and has been authorized for detection and/or diagnosis of SARS-CoV-2 by FDA under an Emergency Use Authorization (EUA). This EUA will  remain in effect (meaning this test can be used) for the duration of the COVID-19 declaration under Section 564(b)(1) of the Act, 21 U.S.C. section 360bbb-3(b)(1), unless the authorization is terminated or revoked.  Performed at [facility], [address], [place], [place] [address]   . WBC 05/05/2021 6.8  4.0 - 10.5 K/uL Final  . RBC 05/05/2021 4.28  3.87 - 5.11 MIL/uL Final  . Hemoglobin 05/05/2021 13.1  12.0 - 15.0 g/dL Final  . HCT 64/40/3474 40.2  36.0 - 46.0 % Final  . MCV 05/05/2021 93.9  80.0 - 100.0 fL Final  . MCH 05/05/2021 30.6  26.0 - 34.0 pg Final  . MCHC 05/05/2021 32.6  30.0 - 36.0 g/dL Final  . RDW 25/95/6387 12.1  11.5 - 15.5 % Final  . Platelets 05/05/2021 342  150 - 400 K/uL Final  . nRBC 05/05/2021 0.0  0.0 - 0.2 % Final  . Neutrophils Relative % 05/05/2021 45  % Final  . Neutro Abs 05/05/2021 3.1  1.7 - 7.7 K/uL Final  . Lymphocytes Relative 05/05/2021 47  % Final  . Lymphs Abs 05/05/2021 3.1  0.7 - 4.0 K/uL Final  . Monocytes Relative 05/05/2021 5  % Final  . Monocytes Absolute 05/05/2021 0.4  0.1 - 1.0 K/uL Final  . Eosinophils Relative 05/05/2021 3  % Final  . Eosinophils Absolute 05/05/2021 0.2  0.0 - 0.5 K/uL Final  . Basophils Relative 05/05/2021 0  % Final  . Basophils Absolute 05/05/2021 0.0  0.0 - 0.1 K/uL Final  . Immature Granulocytes 05/05/2021 0  % Final  . Abs Immature Granulocytes 05/05/2021 0.02  0.00 - 0.07 K/uL Final   Performed at [facility], [address] [place], [place] [address]  . Sodium 05/05/2021 136  135 - 145 mmol/L Final  . Potassium 05/05/2021 3.6  3.5 - 5.1 mmol/L Final  . Chloride 05/05/2021 107  98 - 111 mmol/L Final  . CO2 05/05/2021 22  22 - 32 mmol/L Final  . Glucose, Bld 05/05/2021 83  70 - 99 mg/dL Final   Glucose reference range applies only to samples taken after fasting for at least 8 hours.  . BUN 05/05/2021 7  6 - 20 mg/dL Final  . Creatinine, Ser 05/05/2021 0.69  0.44 - 1.00 mg/dL Final  .  Calcium 29/51/8841 9.3  8.9 - 10.3 mg/dL Final  . Total  Protein 05/05/2021 6.8  6.5 - 8.1 g/dL Final  . Albumin 13/03/6577 3.8  3.5 - 5.0 g/dL Final  . AST 46/96/2952 17  15 - 41 U/L Final  . ALT 05/05/2021 17  0 - 44 U/L Final  . Alkaline Phosphatase 05/05/2021 51  38 - 126 U/L Final  . Total Bilirubin 05/05/2021 1.3 (A) 0.3 - 1.2 mg/dL Final  . GFR, Estimated 05/05/2021 >60  >60 mL/min Final   Comment: (NOTE) Calculated using the CKD-EPI Creatinine Equation (2021)   . Anion gap 05/05/2021 7  5 - 15 Final   Performed at [facility], [address] [place], [place] [address]  . Hgb A1c MFr Bld 05/05/2021 4.5 (A) 4.8 - 5.6 % Final   Comment: (NOTE) Pre diabetes:          5.7%-6.4%  Diabetes:              >6.4%  Glycemic control for   <7.0% adults with diabetes   . Mean Plasma Glucose 05/05/2021 82.45  mg/dL Final   Performed at [facility], [address] [place], [place] [address]  . Cholesterol 05/05/2021 121  0 - 200 mg/dL Final  . Triglycerides 05/05/2021 57  <150 mg/dL Final  . HDL 27/25/3664 52  >40 mg/dL Final  . Total CHOL/HDL Ratio 05/05/2021 2.3  RATIO Final  . VLDL 05/05/2021 11  0 - 40 mg/dL Final  . LDL Cholesterol 05/05/2021 58  0 - 99 mg/dL Final   Comment:        Total Cholesterol/HDL:CHD Risk Coronary Heart Disease Risk Table                     Men   Women  1/2 Average Risk   3.4   3.3  Average Risk       5.0   4.4  2 X Average Risk   9.6   7.1  3 X Average Risk  23.4   11.0        Use the calculated Patient Ratio above and the CHD Risk Table to determine the patient's CHD Risk.        ATP III CLASSIFICATION (LDL):  <100     mg/dL   Optimal  403-474  mg/dL   Near or Above                    Optimal  130-159  mg/dL   Borderline  259-563  mg/dL   High  >875     mg/dL   Very High Performed at [facility], [address] [place], [place] [address]   . TSH 05/05/2021 2.727  0.350 - 4.500 uIU/mL Final   Comment: Performed by a  3rd Generation assay with a functional sensitivity of <=0.01 uIU/mL. Performed at [facility], [address] [place], [place] [address]   . SARS Coronavirus 2 Ag 05/05/2021 Negative  Negative Final  . POC Amphetamine UR 05/05/2021 None Detected  NONE DETECTED (Cut Off Level 1000 ng/mL) Final  . POC Secobarbital (BAR) 05/05/2021 None Detected  NONE DETECTED (Cut Off Level 300 ng/mL) Final  . POC Buprenorphine (BUP) 05/05/2021 None Detected  NONE DETECTED (Cut Off Level 10 ng/mL) Final  . POC Oxazepam (BZO) 05/05/2021 None Detected  NONE DETECTED (Cut Off Level 300 ng/mL) Final  . POC Cocaine UR 05/05/2021 None Detected  NONE DETECTED (Cut Off Level 300 ng/mL) Final  . POC Methamphetamine UR  05/05/2021 None Detected  NONE DETECTED (Cut Off Level 1000 ng/mL) Final  . POC Morphine 05/05/2021 None Detected  NONE DETECTED (Cut Off Level 300 ng/mL) Final  . POC Oxycodone UR 05/05/2021 None Detected  NONE DETECTED (Cut Off Level 100 ng/mL) Final  . POC Methadone UR 05/05/2021 None Detected  NONE DETECTED (Cut Off Level 300 ng/mL) Final  . POC Marijuana UR 05/05/2021 None Detected  NONE DETECTED (Cut Off Level 50 ng/mL) Final  . Preg Test, Ur 05/05/2021 NEGATIVE  NEGATIVE Final   Comment:        THE SENSITIVITY OF THIS METHODOLOGY IS >24 mIU/mL   . SARSCOV2ONAVIRUS 2 AG 05/05/2021 NEGATIVE  NEGATIVE Final   Comment: (NOTE) SARS-CoV-2 antigen NOT DETECTED.   Negative results are presumptive.  Negative results do not preclude SARS-CoV-2 infection and should not be used as the sole basis for treatment or other patient management decisions, including infection  control decisions, particularly in the presence of clinical signs and  symptoms consistent with COVID-19, or in those who have been in contact with the virus.  Negative results must be combined with clinical observations, patient history, and epidemiological information. The expected result is Negative.  Fact Sheet for Patients:  https://www.jennings-kim.com/  Fact Sheet for Healthcare Providers: https://alexander-rogers.biz/  This test is not yet approved or cleared by the [place] FDA and  has been authorized for detection and/or diagnosis of SARS-CoV-2 by FDA under an Emergency Use Authorization (EUA).  This EUA will remain in effect (meaning this test can be used) for the duration of  the COV                          ID-19 declaration under Section 564(b)(1) of the Act, 21 U.S.C. section 360bbb-3(b)(1), unless the authorization is terminated or revoked sooner.    Orders Only on 04/25/2021  Component Date Value Ref Range Status  . hCG,Beta Subunit,Qual,Serum 04/25/2021 Negative  Negative <6 mIU/mL Final  Clinical Support on 02/20/2021  Component Date Value Ref Range Status  . Preg Test, Ur 02/20/2021 Negative  Negative Final  Office Visit on 01/02/2021  Component Date Value Ref Range Status  . WBC 01/02/2021 6.2  4.5 - 10.5 K/uL Final  . RBC 01/02/2021 4.34  3.87 - 5.11 Mil/uL Final  . Hemoglobin 01/02/2021 13.7  12.0 - 15.0 g/dL Final  . HCT 48/88/9169 40.6  36.0 - 46.0 % Final  . MCV 01/02/2021 93.5  78.0 - 100.0 fl Final  . MCHC 01/02/2021 33.9  30.0 - 36.0 g/dL Final  . RDW 45/10/8880 12.7  11.5 - 14.6 % Final  . Platelets 01/02/2021 275.0  150.0 - 400.0 K/uL Final  . Neutrophils Relative % 01/02/2021 47.6  43.0 - 77.0 % Final  . Lymphocytes Relative 01/02/2021 43.2  12.0 - 46.0 % Final  . Monocytes Relative 01/02/2021 5.5  3.0 - 12.0 % Final  . Eosinophils Relative 01/02/2021 3.2  0.0 - 5.0 % Final  . Basophils Relative 01/02/2021 0.5  0.0 - 3.0 % Final  . Neutro Abs 01/02/2021 2.9  1.4 - 7.7 K/uL Final  . Lymphs Abs 01/02/2021 2.7  0.7 - 4.0 K/uL Final  . Monocytes Absolute 01/02/2021 0.3  0.1 - 1.0 K/uL Final  . Eosinophils Absolute 01/02/2021 0.2  0.0 - 0.7 K/uL Final  . Basophils Absolute 01/02/2021 0.0  0.0 - 0.1 K/uL Final  . Sodium 01/02/2021 136  135 - 145  mEq/L Final  . Potassium 01/02/2021  3.7  3.5 - 5.1 mEq/L Final  . Chloride 01/02/2021 105  96 - 112 mEq/L Final  . CO2 01/02/2021 24  19 - 32 mEq/L Final  . Glucose, Bld 01/02/2021 89  70 - 99 mg/dL Final  . BUN 81/44/8185 8  6 - 23 mg/dL Final  . Creatinine, Ser 01/02/2021 0.81  0.40 - 1.20 mg/dL Final  . Total Bilirubin 01/02/2021 1.6 (A) 0.2 - 1.2 mg/dL Final  . Alkaline Phosphatase 01/02/2021 48  39 - 117 U/L Final  . AST 01/02/2021 13  0 - 37 U/L Final  . ALT 01/02/2021 12  0 - 35 U/L Final  . Total Protein 01/02/2021 6.6  6.0 - 8.3 g/dL Final  . Albumin 63/14/9702 4.2  3.5 - 5.2 g/dL Final  . GFR 63/78/5885 104.44  >60.00 mL/min Final   Calculated using the CKD-EPI Creatinine Equation (2021)  . Calcium 01/02/2021 9.5  8.4 - 10.5 mg/dL Final  . TSH 02/77/4128 1.96  0.35 - 5.50 uIU/mL Final  Office Visit on 11/29/2020  Component Date Value Ref Range Status  . Color, Urine 11/29/2020 YELLOW  YELLOW Final  . APPearance 11/29/2020 CLEAR  CLEAR Final  . Specific Gravity, Urine 11/29/2020 1.025  1.001 - 1.03 Final  . pH 11/29/2020 6.5  5.0 - 8.0 Final  . Glucose, UA 11/29/2020 NEGATIVE  NEGATIVE Final  . Bilirubin Urine 11/29/2020 NEGATIVE  NEGATIVE Final  . Ketones, ur 11/29/2020 NEGATIVE  NEGATIVE Final  . Hgb urine dipstick 11/29/2020 2+ (A) NEGATIVE Final  . Protein, ur 11/29/2020 NEGATIVE  NEGATIVE Final  . Nitrites, Initial 11/29/2020 NEGATIVE  NEGATIVE Final  . Leukocyte Esterase 11/29/2020 NEGATIVE  NEGATIVE Final  . WBC, UA 11/29/2020 0-5  0 - 5 /HPF Final  . RBC / HPF 11/29/2020 3-10 (A) 0 - 2 /HPF Final  . Squamous Epithelial / LPF 11/29/2020 6-10 (A) < OR = 5 /HPF Final  . Bacteria, UA 11/29/2020 MODERATE (A) NONE SEEN /HPF Final  . Hyaline Cast 11/29/2020 NONE SEEN  NONE SEEN /LPF Final  . Note 11/29/2020    Final   Comment: This urine was analyzed for the presence of WBC,  RBC, bacteria, casts, and other formed elements.  Only those elements seen were  reported. . .   . MICRO NUMBER: 11/29/2020 [id number]   Final  . SPECIMEN QUALITY: 11/29/2020 Adequate   Final  . Sample Source 11/29/2020 URINE   Final  . STATUS: 11/29/2020 FINAL   Final  . Result: 11/29/2020    Final                   Value:Mixed genital flora isolated. These superficial bacteria are not indicative of a urinary tract infection. No further organism identification is warranted on this specimen. If clinically indicated, recollect clean-catch, mid-stream urine and transfer  immediately to Urine Culture Transport Tube.   Marland Kitchen COMMENT: 11/29/2020    Final                   Value:Due to supply chain limits, the laboratory is temporarily performing urine cultures from FDA approved preservative tubes that have not been validated by [facility], as well as from refrigerated sterile urine collection cups that do not contain a  preservative. Culture of unpreserved urine may produce falsely elevated bacterial counts.   . REFLEXIVE URINE CULTURE 11/29/2020    Final   CULTURE INDICATED - RESULTS TO FOLLOW    Allergies: Patient has no known allergies.  PTA Medications: (  Not in a hospital admission)   Medical Decision Making  Patient with ongoing SI, HI, worsening depression and hallucination. Patient will be admitted to [facility] for stabilization and treatment.  -restart home medication -order for labs/review available lab results    Recommendations  Based on my evaluation the patient does not appear to have an emergency medical condition.  Maricela Bo, NP 05/05/21  3:54 AM

## 2021-05-05 NOTE — ED Notes (Signed)
Patient denies SI - she says she feels better.  Denies HI and AVH - no sxs of distress - will continue to monitor for safety
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
## 2021-05-05 NOTE — ED Notes (Signed)
Patient to flex obs unit. No SXS of distress noted, will continue to monitor for safety

## 2021-05-05 NOTE — ED Notes (Signed)
Pt eating breakfast in no acute distress. Safety maintained.

## 2021-05-05 NOTE — ED Notes (Signed)
Pt eating lunch

## 2021-05-05 NOTE — ED Notes (Signed)
Patient A&Ox4. Continue to endorse SI w/plan to cut her throat. Pt states, "I feel a little overwhelmed with everything going on in my life right now and plus, being here. I start thinking about negative stuff that has happened to me instead of the positive things that's happened in my life. I feel like somebody is always watching me. It keeps me on edge". Denies HI and A/VH. Patient denies any physical complaints when asked.  Support and encouragement provided. Recommended pt to utilize coping skills sheets in welcome folder for distraction techniques when feeling overwhelmed. Ensured pt of her safety on the unit. Routine safety checks conducted according to facility protocol. Encouraged patient to notify staff if thoughts of harm toward self or others arise. Patient verbalize understanding and agreement. Patient verbally contracts for safety at this time. Will continue to monitor.

## 2021-05-05 NOTE — Group Note (Signed)
Group Topic: Social Support  Group Date: 05/05/2021 Start Time: 1000 End Time: 1045 Facilitators: Loma Newton  Department: [facility]  Number of Participants: 3  Group Focus: identifying support systems Treatment Modality:  Cognitive Behavioral Therapy Interventions utilized were support Purpose: improve communication skills and finding ways to utilize support systems to help with a current problem.  Name: Gail [name] Date of Birth: 04-13-2001  MR: [id number]    Level of Participation: active Quality of Participation: cooperative Interactions with others: gave feedback Mood/Affect: appropriate Triggers (if applicable): n/a Cognition: coherent/clear Progress: Gaining insight Response: positive response to feedback. Plan: patient will be encouraged to speak with family about allowing space for a mental break. Pt shared that she would like to slowly start to engage in support groups to maintain her overall mental health.  Patients Problems:  Patient Active Problem List   Diagnosis Date Noted   MDD (major depressive disorder), recurrent, severe, 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/2021   Initiation of Depo Provera 09/24/2018   MDD (major depressive disorder), single episode, severe with psychosis (HCC) 05/03/2018   Severe major depression with psychotic features, mood-congruent (HCC) 04/28/2018   Suicide ideation 04/28/2018   GAD (generalized anxiety disorder) 04/28/2018

## 2021-05-05 NOTE — ED Notes (Signed)
Pt A&O x 4, presents with suicidal ideations, plan to cut throat with a knife.  Pt also Homicidal towards co-workers, stating she would strangle them or cut their throats if she had the chance.  Pt calm & cooperative at present, no distress noted.  Monitoring for safety.

## 2021-05-06 DIAGNOSIS — F333 Major depressive disorder, recurrent, severe with psychotic symptoms: Secondary | ICD-10-CM | POA: Diagnosis not present

## 2021-05-06 MED ORDER — QUETIAPINE FUMARATE 25 MG PO TABS
25.0000 mg | ORAL_TABLET | Freq: Once | ORAL | Status: AC
  Administered 2021-05-06: 25 mg via ORAL
  Filled 2021-05-06: qty 1

## 2021-05-06 MED ORDER — SERTRALINE HCL 50 MG PO TABS
50.0000 mg | ORAL_TABLET | Freq: Every day | ORAL | Status: DC
  Administered 2021-05-07: 50 mg via ORAL
  Filled 2021-05-06: qty 1
  Filled 2021-05-06: qty 7

## 2021-05-06 MED ORDER — QUETIAPINE FUMARATE 50 MG PO TABS
50.0000 mg | ORAL_TABLET | Freq: Every day | ORAL | Status: DC
  Filled 2021-05-06: qty 7

## 2021-05-06 NOTE — Progress Notes (Signed)
Received Gail [name] this AM in the dining room completing her breakfast. She endorsed feeling anxious and denied feeling suicidal. She  has been OOB in the milieu and attending the group therapy sessions.

## 2021-05-06 NOTE — ED Notes (Signed)
Patient wants to wait to do EKG til Breakfast time - will pass along to day shift

## 2021-05-06 NOTE — ED Notes (Signed)
Patient is resting quietly in bed with eyes closed, respirations are even and unlabored. No signs/symptoms of distress. Will continue to monitor.

## 2021-05-06 NOTE — ED Notes (Signed)
Patient resting - no sxs of distress noted, will continue to monitor for safety 

## 2021-05-06 NOTE — ED Notes (Signed)
Patient resting - no sxs of distress noted, will continue to monitor for safety

## 2021-05-06 NOTE — Group Note (Signed)
Group Topic: Social Support  Group Date: 05/06/2021 Start Time: 1930 End Time: 2000 Facilitators: Armandina Stammer, [place]  Department: [facility]  Number of Participants: 4  Group Focus: check in Treatment Modality:  Cognitive Behavioral Therapy Interventions utilized were exploration and support Purpose: increase insight  Name: Gail [name] Date of Birth: 2001-05-09  MR: [id number]    Level of Participation: minimal Quality of Participation: engaged Interactions with others: n/a Mood/Affect: flat Triggers (if applicable): n/a Cognition: coherent/clear Progress: Minimal Response: n/a Plan: follow-up needed  Patients Problems:  Patient Active Problem List   Diagnosis Date Noted   MDD (major depressive disorder), recurrent, severe, 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/2021   Initiation of Depo Provera 09/24/2018   MDD (major depressive disorder), single episode, severe with psychosis (HCC) 05/03/2018   Severe major depression with psychotic features, mood-congruent (HCC) 04/28/2018   Suicide ideation 04/28/2018   GAD (generalized anxiety disorder) 04/28/2018

## 2021-05-06 NOTE — ED Notes (Signed)
Pt did not attend wrap-up group   

## 2021-05-06 NOTE — Group Note (Signed)
Group Topic: Fears and Unhealthy Coping Skills  Group Date: 05/06/2021 Start Time: 1100 End Time: 1150 Facilitators: Juluis Pitch P  Department: [facility]  Number of Participants: 5  Group Focus: safety plan Treatment Modality:  Solution-Focused Therapy Interventions utilized were patient education Purpose: enhance coping skills  Name: Gail [name] Date of Birth: 03-30-2001  MR: [id number]    Level of Participation: active Quality of Participation: attentive and cooperative Interactions with others: gave feedback Mood/Affect: appropriate Triggers (if applicable): toxic people, negative thoughts Cognition: coherent/clear Progress: Moderate Response: pt completed assessment stated she wants to finish school and that's whats keeping her going Plan: follow-up needed  Patients Problems:  Patient Active Problem List   Diagnosis Date Noted   MDD (major depressive disorder), recurrent, severe, 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/2021   Initiation of Depo Provera 09/24/2018   MDD (major depressive disorder), single episode, severe with psychosis (HCC) 05/03/2018   Severe major depression with psychotic features, mood-congruent (HCC) 04/28/2018   Suicide ideation 04/28/2018   GAD (generalized anxiety disorder) 04/28/2018

## 2021-05-06 NOTE — ED Provider Notes (Signed)
Behavioral Health Progress Note  Date and Time: 05/06/2021 9:45 AM Name: Gail [name] MRN:  [id number]  Subjective: Patient seen and reassessed face-to-face and chart reviewed by this provider. On evaluation, patient is alert and oriented x4. Her thought process is logical and speech is coherent. Her mood is depressed and affect is congruent. She reports feeling a little bit better today. She states that she no longer is having suicidal thoughts. She states that she is no longer having homicidal thoughts towards her coworkers or her sister.  She denies auditory and visual hallucinations. She does not appear to be responding to internal or external stimuli. She reports good sleep. She reports that her appetite is "so-so," but states that she is eating. She reports feeling tired throughout the day and sleeping a lot. She continues to endorse depressive symptoms of sadness, worthlessness, and hopelessness.   Diagnosis:  Final diagnoses:  MDD (major depressive disorder), recurrent, severe, with psychosis (HCC)    Total Time spent with patient: 20 minutes  Past Psychiatric History: MDD and anxiety  Past Medical History:  Past Medical History:  Diagnosis Date   Anxiety    Depression    Medical history non-contributory    Vision abnormalities     Past Surgical History:  Procedure Laterality Date   WISDOM TOOTH EXTRACTION     Family History:  Family History  Problem Relation Age of Onset   Rheum arthritis Mother    Lupus Mother    Obesity Mother    Hypertension Mother    Kidney disease Mother    Healthy Sister    Breast cancer Maternal Grandmother    Cancer Maternal Grandmother        breast cancer   Diabetes Maternal Grandmother    Hypertension Maternal Grandmother    Hyperlipidemia Maternal Grandmother    Breast cancer Cousin    Diabetes Father    Family Psychiatric History: no family hx reported  Social History:  Social History   Substance and Sexual Activity   Alcohol Use Yes   Comment: Rare     Social History   Substance and Sexual Activity  Drug Use Yes   Frequency: 3.0 times per week   Types: Marijuana    Social History   Socioeconomic History   Marital status: Single    Spouse name: Not on file   Number of children: Not on file   Years of education: Not on file   Highest education level: Not on file  Occupational History   Not on file  Tobacco Use   Smoking status: Never   Smokeless tobacco: Never  Vaping Use   Vaping Use: Never used  Substance and Sexual Activity   Alcohol use: Yes    Comment: Rare   Drug use: Yes    Frequency: 3.0 times per week    Types: Marijuana   Sexual activity: Not Currently    Birth control/protection: Injection    Comment: 1st intercourse- 16, partners- 2,   Other Topics Concern   Not on file  Social History Narrative   Not on file   Social Determinants of Health   Financial Resource Strain: Not on file  Food Insecurity: Not on file  Transportation Needs: Not on file  Physical Activity: Not on file  Stress: Not on file  Social Connections: Not on file   SDOH:  SDOH Screenings   Alcohol Screen: Not on file  Depression (PHQ2-9): Medium Risk   PHQ-2 Score: 16  Financial Resource Strain: Not  on file  Food Insecurity: Not on file  Housing: Not on file  Physical Activity: Not on file  Social Connections: Not on file  Stress: Not on file  Tobacco Use: Low Risk    Smoking Tobacco Use: Never   Smokeless Tobacco Use: Never  Transportation Needs: Not on file   Additional Social History:    Pain Medications: See MAR Prescriptions: See MAR Over the Counter: See MAR History of alcohol / drug use?: Yes Longest period of sobriety (when/how long): Unknown Negative Consequences of Use:  (None noted) Withdrawal Symptoms: None Name of Substance 1: CBD 1 - Age of First Use: Unknown 1 - Amount (size/oz): Several hits of a vape 1 - Frequency: 2x/week 1 - Duration: Unknown 1 - Last Use  / Amount: Earlier this week 1 - Method of Aquiring: Purchase 1- Route of Use: Oral (Smoke)   Sleep: Fair  Appetite:  Fair  Current Medications:  Current Facility-Administered Medications  Medication Dose Route Frequency Provider Last Rate Last Admin   acetaminophen (TYLENOL) tablet 650 mg  650 mg Oral Q6H PRN Ajibola, Ene A, NP       alum & mag hydroxide-simeth (MAALOX/MYLANTA) 200-200-20 MG/5ML suspension 30 mL  30 mL Oral Q4H PRN Ajibola, Ene A, NP       hydrOXYzine (ATARAX/VISTARIL) tablet 25 mg  25 mg Oral TID PRN Ajibola, Ene A, NP       magnesium hydroxide (MILK OF MAGNESIA) suspension 30 mL  30 mL Oral Daily PRN Ajibola, Ene A, NP       QUEtiapine (SEROQUEL) tablet 25 mg  25 mg Oral BID Gage Weant L, NP   25 mg at 05/06/21 0932   sertraline (ZOLOFT) tablet 25 mg  25 mg Oral Daily Ubaldo Daywalt L, NP   25 mg at 05/06/21 0932   traZODone (DESYREL) tablet 50 mg  50 mg Oral QHS PRN Ajibola, Ene A, NP       Current Outpatient Medications  Medication Sig Dispense Refill   QUEtiapine (SEROQUEL) 25 MG tablet Take 1 tablet (25 mg total) by mouth 2 (two) times daily. 30 tablet 0   sertraline (ZOLOFT) 25 MG tablet Take 1 tablet (25 mg total) by mouth daily for 3 days, THEN 2 tablets (50 mg total) daily. 30 tablet 0   ARIPiprazole (ABILIFY) 5 MG tablet Take 1 tablet (5 mg total) by mouth daily. (Patient not taking: No sig reported) 30 tablet 0   famotidine (PEPCID) 20 MG tablet Take 1 tablet (20 mg total) by mouth 2 (two) times daily. (Patient not taking: No sig reported) 60 tablet 6   medroxyPROGESTERone (DEPO-PROVERA) 150 MG/ML injection Inject 1 mL (150 mg total) into the muscle every 3 (three) months. (Patient not taking: No sig reported) 1 mL 4    Labs  Lab Results:  Admission on 05/04/2021  Component Date Value Ref Range Status   SARS Coronavirus 2 by RT PCR 05/05/2021 NEGATIVE  NEGATIVE Final   Comment: (NOTE) SARS-CoV-2 target nucleic acids are NOT DETECTED.  The  SARS-CoV-2 RNA is generally detectable in upper respiratory specimens during the acute phase of infection. The lowest concentration of SARS-CoV-2 viral copies this assay can detect is 138 copies/mL. A negative result does not preclude SARS-Cov-2 infection and should not be used as the sole basis for treatment or other patient management decisions. A negative result may occur with  improper specimen collection/handling, submission of specimen other than nasopharyngeal swab, presence of viral mutation(s) within the areas targeted by  this assay, and inadequate number of viral copies(<138 copies/mL). A negative result must be combined with clinical observations, patient history, and epidemiological information. The expected result is Negative.  Fact Sheet for Patients:  BloggerCourse.com  Fact Sheet for Healthcare Providers:  SeriousBroker.it  This test is no                          t yet approved or cleared by the [place] FDA and  has been authorized for detection and/or diagnosis of SARS-CoV-2 by FDA under an Emergency Use Authorization (EUA). This EUA will remain  in effect (meaning this test can be used) for the duration of the COVID-19 declaration under Section 564(b)(1) of the Act, 21 U.S.C.section 360bbb-3(b)(1), unless the authorization is terminated  or revoked sooner.       Influenza A by PCR 05/05/2021 NEGATIVE  NEGATIVE Final   Influenza B by PCR 05/05/2021 NEGATIVE  NEGATIVE Final   Comment: (NOTE) The Xpert Xpress SARS-CoV-2/FLU/RSV plus assay is intended as an aid in the diagnosis of influenza from Nasopharyngeal swab specimens and should not be used as a sole basis for treatment. Nasal washings and aspirates are unacceptable for Xpert Xpress SARS-CoV-2/FLU/RSV testing.  Fact Sheet for Patients: BloggerCourse.com  Fact Sheet for Healthcare  Providers: SeriousBroker.it  This test is not yet approved or cleared by the [place] FDA and has been authorized for detection and/or diagnosis of SARS-CoV-2 by FDA under an Emergency Use Authorization (EUA). This EUA will remain in effect (meaning this test can be used) for the duration of the COVID-19 declaration under Section 564(b)(1) of the Act, 21 U.S.C. section 360bbb-3(b)(1), unless the authorization is terminated or revoked.  Performed at [facility], [address] [place], [place] [address]    WBC 05/05/2021 6.8  4.0 - 10.5 K/uL Final   RBC 05/05/2021 4.28  3.87 - 5.11 MIL/uL Final   Hemoglobin 05/05/2021 13.1  12.0 - 15.0 g/dL Final   HCT 54/65/6812 40.2  36.0 - 46.0 % Final   MCV 05/05/2021 93.9  80.0 - 100.0 fL Final   MCH 05/05/2021 30.6  26.0 - 34.0 pg Final   MCHC 05/05/2021 32.6  30.0 - 36.0 g/dL Final   RDW 75/17/0017 12.1  11.5 - 15.5 % Final   Platelets 05/05/2021 342  150 - 400 K/uL Final   nRBC 05/05/2021 0.0  0.0 - 0.2 % Final   Neutrophils Relative % 05/05/2021 45  % Final   Neutro Abs 05/05/2021 3.1  1.7 - 7.7 K/uL Final   Lymphocytes Relative 05/05/2021 47  % Final   Lymphs Abs 05/05/2021 3.1  0.7 - 4.0 K/uL Final   Monocytes Relative 05/05/2021 5  % Final   Monocytes Absolute 05/05/2021 0.4  0.1 - 1.0 K/uL Final   Eosinophils Relative 05/05/2021 3  % Final   Eosinophils Absolute 05/05/2021 0.2  0.0 - 0.5 K/uL Final   Basophils Relative 05/05/2021 0  % Final   Basophils Absolute 05/05/2021 0.0  0.0 - 0.1 K/uL Final   Immature Granulocytes 05/05/2021 0  % Final   Abs Immature Granulocytes 05/05/2021 0.02  0.00 - 0.07 K/uL Final   Performed at [facility], [address], [place], [place] [address]   Sodium 05/05/2021 136  135 - 145 mmol/L Final   Potassium 05/05/2021 3.6  3.5 - 5.1 mmol/L Final   Chloride 05/05/2021 107  98 - 111 mmol/L Final   CO2 05/05/2021 22  22 - 32 mmol/L Final   Glucose, Bld  05/05/2021 83  70 - 99 mg/dL Final   Glucose reference range applies only to samples taken after fasting for at least 8 hours.   BUN 05/05/2021 7  6 - 20 mg/dL Final   Creatinine, Ser 05/05/2021 0.69  0.44 - 1.00 mg/dL Final   Calcium 70/62/3762 9.3  8.9 - 10.3 mg/dL Final   Total Protein 83/15/1761 6.8  6.5 - 8.1 g/dL Final   Albumin 60/73/7106 3.8  3.5 - 5.0 g/dL Final   AST 26/94/8546 17  15 - 41 U/L Final   ALT 05/05/2021 17  0 - 44 U/L Final   Alkaline Phosphatase 05/05/2021 51  38 - 126 U/L Final   Total Bilirubin 05/05/2021 1.3 (A) 0.3 - 1.2 mg/dL Final   GFR, Estimated 05/05/2021 >60  >60 mL/min Final   Comment: (NOTE) Calculated using the CKD-EPI Creatinine Equation (2021)    Anion gap 05/05/2021 7  5 - 15 Final   Performed at [facility], [address] [place], [place] [address]   Hgb A1c MFr Bld 05/05/2021 4.5 (A) 4.8 - 5.6 % Final   Comment: (NOTE) Pre diabetes:          5.7%-6.4%  Diabetes:              >6.4%  Glycemic control for   <7.0% adults with diabetes    Mean Plasma Glucose 05/05/2021 82.45  mg/dL Final   Performed at [facility], [address], [place], [place] [address]   Cholesterol 05/05/2021 121  0 - 200 mg/dL Final   Triglycerides 18/29/9371 57  <150 mg/dL Final   HDL 69/67/8938 52  >40 mg/dL Final   Total CHOL/HDL Ratio 05/05/2021 2.3  RATIO Final   VLDL 05/05/2021 11  0 - 40 mg/dL Final   LDL Cholesterol 05/05/2021 58  0 - 99 mg/dL Final   Comment:        Total Cholesterol/HDL:CHD Risk Coronary Heart Disease Risk Table                     Men   Women  1/2 Average Risk   3.4   3.3  Average Risk       5.0   4.4  2 X Average Risk   9.6   7.1  3 X Average Risk  23.4   11.0        Use the calculated Patient Ratio above and the CHD Risk Table to determine the patient's CHD Risk.        ATP III CLASSIFICATION (LDL):  <100     mg/dL   Optimal  101-751  mg/dL   Near or Above                    Optimal  130-159  mg/dL    Borderline  025-852  mg/dL   High  >778     mg/dL   Very High Performed at [facility], [address], [place], [place] [address]    TSH 05/05/2021 2.727  0.350 - 4.500 uIU/mL Final   Comment: Performed by a 3rd Generation assay with a functional sensitivity of <=0.01 uIU/mL. Performed at [facility], [address], [place], [place] [address]    SARS Coronavirus 2 Ag 05/05/2021 Negative  Negative Final   POC Amphetamine UR 05/05/2021 None Detected  NONE DETECTED (Cut Off Level 1000 ng/mL) Final  POC Secobarbital (BAR) 05/05/2021 None Detected  NONE DETECTED (Cut Off Level 300 ng/mL) Final   POC Buprenorphine (BUP) 05/05/2021 None Detected  NONE DETECTED (Cut Off Level 10 ng/mL) Final   POC Oxazepam (BZO) 05/05/2021 None Detected  NONE DETECTED (Cut Off Level 300 ng/mL) Final   POC Cocaine UR 05/05/2021 None Detected  NONE DETECTED (Cut Off Level 300 ng/mL) Final   POC Methamphetamine UR 05/05/2021 None Detected  NONE DETECTED (Cut Off Level 1000 ng/mL) Final   POC Morphine 05/05/2021 None Detected  NONE DETECTED (Cut Off Level 300 ng/mL) Final   POC Oxycodone UR 05/05/2021 None Detected  NONE DETECTED (Cut Off Level 100 ng/mL) Final   POC Methadone UR 05/05/2021 None Detected  NONE DETECTED (Cut Off Level 300 ng/mL) Final   POC Marijuana UR 05/05/2021 None Detected  NONE DETECTED (Cut Off Level 50 ng/mL) Final   Preg Test, Ur 05/05/2021 NEGATIVE  NEGATIVE Final   Comment:        THE SENSITIVITY OF THIS METHODOLOGY IS >24 mIU/mL    SARSCOV2ONAVIRUS 2 AG 05/05/2021 NEGATIVE  NEGATIVE Final   Comment: (NOTE) SARS-CoV-2 antigen NOT DETECTED.   Negative results are presumptive.  Negative results do not preclude SARS-CoV-2 infection and should not be used as the sole basis for treatment or other patient management decisions, including infection  control decisions, particularly in the presence of clinical signs and  symptoms consistent with COVID-19, or in those who have  been in contact with the virus.  Negative results must be combined with clinical observations, patient history, and epidemiological information. The expected result is Negative.  Fact Sheet for Patients: https://www.jennings-kim.com/  Fact Sheet for Healthcare Providers: https://alexander-rogers.biz/  This test is not yet approved or cleared by the [place] FDA and  has been authorized for detection and/or diagnosis of SARS-CoV-2 by FDA under an Emergency Use Authorization (EUA).  This EUA will remain in effect (meaning this test can be used) for the duration of  the COV                          ID-19 declaration under Section 564(b)(1) of the Act, 21 U.S.C. section 360bbb-3(b)(1), unless the authorization is terminated or revoked sooner.    Orders Only on 04/25/2021  Component Date Value Ref Range Status   hCG,Beta Subunit,Qual,Serum 04/25/2021 Negative  Negative <6 mIU/mL Final  Clinical Support on 02/20/2021  Component Date Value Ref Range Status   Preg Test, Ur 02/20/2021 Negative  Negative Final  Office Visit on 01/02/2021  Component Date Value Ref Range Status   WBC 01/02/2021 6.2  4.5 - 10.5 K/uL Final   RBC 01/02/2021 4.34  3.87 - 5.11 Mil/uL Final   Hemoglobin 01/02/2021 13.7  12.0 - 15.0 g/dL Final   HCT 16/05/9603 40.6  36.0 - 46.0 % Final   MCV 01/02/2021 93.5  78.0 - 100.0 fl Final   MCHC 01/02/2021 33.9  30.0 - 36.0 g/dL Final   RDW 54/04/8118 12.7  11.5 - 14.6 % Final   Platelets 01/02/2021 275.0  150.0 - 400.0 K/uL Final   Neutrophils Relative % 01/02/2021 47.6  43.0 - 77.0 % Final   Lymphocytes Relative 01/02/2021 43.2  12.0 - 46.0 % Final   Monocytes Relative 01/02/2021 5.5  3.0 - 12.0 % Final   Eosinophils Relative 01/02/2021 3.2  0.0 - 5.0 % Final   Basophils Relative 01/02/2021 0.5  0.0 - 3.0 % Final   Neutro Abs 01/02/2021  2.9  1.4 - 7.7 K/uL Final   Lymphs Abs 01/02/2021 2.7  0.7 - 4.0 K/uL Final   Monocytes Absolute  01/02/2021 0.3  0.1 - 1.0 K/uL Final   Eosinophils Absolute 01/02/2021 0.2  0.0 - 0.7 K/uL Final   Basophils Absolute 01/02/2021 0.0  0.0 - 0.1 K/uL Final   Sodium 01/02/2021 136  135 - 145 mEq/L Final   Potassium 01/02/2021 3.7  3.5 - 5.1 mEq/L Final   Chloride 01/02/2021 105  96 - 112 mEq/L Final   CO2 01/02/2021 24  19 - 32 mEq/L Final   Glucose, Bld 01/02/2021 89  70 - 99 mg/dL Final   BUN 32/35/5732 8  6 - 23 mg/dL Final   Creatinine, Ser 01/02/2021 0.81  0.40 - 1.20 mg/dL Final   Total Bilirubin 01/02/2021 1.6 (A) 0.2 - 1.2 mg/dL Final   Alkaline Phosphatase 01/02/2021 48  39 - 117 U/L Final   AST 01/02/2021 13  0 - 37 U/L Final   ALT 01/02/2021 12  0 - 35 U/L Final   Total Protein 01/02/2021 6.6  6.0 - 8.3 g/dL Final   Albumin 20/25/4270 4.2  3.5 - 5.2 g/dL Final   GFR 62/37/6283 104.44  >60.00 mL/min Final   Calculated using the CKD-EPI Creatinine Equation (2021)   Calcium 01/02/2021 9.5  8.4 - 10.5 mg/dL Final   TSH 15/17/6160 1.96  0.35 - 5.50 uIU/mL Final  Office Visit on 11/29/2020  Component Date Value Ref Range Status   Color, Urine 11/29/2020 YELLOW  YELLOW Final   APPearance 11/29/2020 CLEAR  CLEAR Final   Specific Gravity, Urine 11/29/2020 1.025  1.001 - 1.03 Final   pH 11/29/2020 6.5  5.0 - 8.0 Final   Glucose, UA 11/29/2020 NEGATIVE  NEGATIVE Final   Bilirubin Urine 11/29/2020 NEGATIVE  NEGATIVE Final   Ketones, ur 11/29/2020 NEGATIVE  NEGATIVE Final   Hgb urine dipstick 11/29/2020 2+ (A) NEGATIVE Final   Protein, ur 11/29/2020 NEGATIVE  NEGATIVE Final   Nitrites, Initial 11/29/2020 NEGATIVE  NEGATIVE Final   Leukocyte Esterase 11/29/2020 NEGATIVE  NEGATIVE Final   WBC, UA 11/29/2020 0-5  0 - 5 /HPF Final   RBC / HPF 11/29/2020 3-10 (A) 0 - 2 /HPF Final   Squamous Epithelial / LPF 11/29/2020 6-10 (A) < OR = 5 /HPF Final   Bacteria, UA 11/29/2020 MODERATE (A) NONE SEEN /HPF Final   Hyaline Cast 11/29/2020 NONE SEEN  NONE SEEN /LPF Final   Note 11/29/2020     Final   Comment: This urine was analyzed for the presence of WBC,  RBC, bacteria, casts, and other formed elements.  Only those elements seen were reported. . Nile Riggs NUMBER: 11/29/2020 [id number]   Final   SPECIMEN QUALITY: 11/29/2020 Adequate   Final   Sample Source 11/29/2020 URINE   Final   STATUS: 11/29/2020 FINAL   Final   Result: 11/29/2020    Final                   Value:Mixed genital flora isolated. These superficial bacteria are not indicative of a urinary tract infection. No further organism identification is warranted on this specimen. If clinically indicated, recollect clean-catch, mid-stream urine and transfer  immediately to Urine Culture Transport Tube.    COMMENT: 11/29/2020    Final                   Value:Due to supply chain limits, the laboratory is temporarily performing urine  cultures from FDA approved preservative tubes that have not been validated by [facility], as well as from refrigerated sterile urine collection cups that do not contain a  preservative. Culture of unpreserved urine may produce falsely elevated bacterial counts.    REFLEXIVE URINE CULTURE 11/29/2020    Final   CULTURE INDICATED - RESULTS TO FOLLOW    Blood Alcohol level:  No results found for: [facility]  Metabolic Disorder Labs: Lab Results  Component Value Date   HGBA1C 4.5 (L) 05/05/2021   MPG 82.45 05/05/2021   MPG 91 04/28/2018   Lab Results  Component Value Date   PROLACTIN 74.5 (H) 04/28/2018   Lab Results  Component Value Date   CHOL 121 05/05/2021   TRIG 57 05/05/2021   HDL 52 05/05/2021   CHOLHDL 2.3 05/05/2021   VLDL 11 05/05/2021   LDLCALC 58 05/05/2021   LDLCALC 62 04/28/2018    Therapeutic Lab Levels: No results found for: LITHIUM No results found for: VALPROATE No components found for:  CBMZ  Physical Findings   AIMS    Flowsheet Row Admission (Discharged) [facility]  AIMS Total  Score 0      GAD-7    Flowsheet Row Office Visit from 01/02/2021 in [facility] Office Visit from 06/07/2019 in [facility] Office Visit from 09/23/2018 in [facility]  Total GAD-7 Score 9 14 6       PHQ2-9    Flowsheet Row ED from 05/04/2021 in [facility] Counselor from 04/25/2021 [facility] Office Visit from 01/02/2021 in [facility] Counselor from 11/06/2020 in [facility] Office Visit from 06/07/2019 in [facility]  PHQ-2 Total Score 5 4 2 6 3   PHQ-9 Total Score 16 15 7 22 16       Flowsheet Row ED from 05/04/2021 in [facility] Counselor from 04/25/2021 in [facility] Counselor from 11/06/2020 in [facility]  C-SSRS RISK CATEGORY High Risk Error: Question 6 not populated Error: Question 6 not populated        Musculoskeletal  Strength & Muscle Tone: within normal limits Gait & Station: normal Patient leans: N/A  Psychiatric Specialty Exam  Presentation  General Appearance: Appropriate for Environment  Eye Contact:Fair  Speech:Clear and Coherent  Speech Volume:Normal  Handedness:Right   Mood and Affect  Mood:Depressed  Affect:Congruent   Thought Process  Thought Processes:Coherent  Descriptions of Associations:Intact  Orientation:Full (Time, Place and Person)  Thought Content:Logical  Diagnosis of Schizophrenia or Schizoaffective disorder in past: Yes  Duration of Psychotic Symptoms: Greater than six months   Hallucinations:Hallucinations: None  Ideas of Reference:None  Suicidal Thoughts:Suicidal Thoughts: No  Homicidal Thoughts:Homicidal Thoughts: No   Sensorium  Memory:Immediate Fair; Remote Fair; Recent Fair  Judgment:Fair  Insight:Fair   Executive Functions  Concentration:Fair  Attention  Span:Fair  Recall:Fair  Fund of Knowledge:Fair  Language:Fair   Psychomotor Activity  Psychomotor Activity:Psychomotor Activity: Normal   Assets  Assets:Communication Skills; Desire for Improvement; Financial Resources/Insurance; Housing; Leisure Time   Sleep  Sleep:Sleep: Fair Number of Hours of Sleep: 8   No data recorded  Physical Exam  Physical Exam HENT:     Head: Normocephalic.     Nose: Nose normal.  Eyes:     Conjunctiva/sclera: Conjunctivae normal.  Cardiovascular:     Rate and Rhythm: Normal rate.  Pulmonary:  Effort: Pulmonary effort is normal.  Musculoskeletal:        General: Normal range of motion.     Cervical back: Normal range of motion.  Neurological:     Mental Status: She is alert and oriented to person, place, and time.   Review of Systems  Constitutional: Negative.   HENT: Negative.    Eyes: Negative.   Respiratory: Negative.    Cardiovascular: Negative.   Gastrointestinal: Negative.   Genitourinary: Negative.   Musculoskeletal: Negative.   Skin: Negative.   Neurological: Negative.   Endo/Heme/Allergies: Negative.   Psychiatric/Behavioral:  Positive for depression.   Blood pressure 100/66, pulse 80, temperature 98.7 F (37.1 C), temperature source Oral, resp. rate 16, SpO2 98 %. There is no height or weight on file to calculate BMI.  Treatment Plan Summary Daily contact with patient to assess and evaluate symptoms and progress in treatment, and medication management   Continue  home medications:  QUEtiapine  25 mg Oral BID   sertraline  25 mg Oral Daily    -will increase Zoloft to 50 mg daily starting 05/07/21 for depression  -will change Seroquel to 50 mg QHS starting 05/07/21 to reduce daytime sleepiness   -psychotherapy - for depression/anxiety    EKG ordered. Labs reviewed.   Disposition is ongoing. patient is currently enrolled in the PHP.  Layla Barter, NP 05/06/2021 9:45 AM

## 2021-05-06 NOTE — ED Notes (Signed)
Patient is calm and cooperative, sitting in the day room with  other patients, interacting with other patient. Denies SI, AVH,  is calm and cooperative. Will continue to monitor.

## 2021-05-07 ENCOUNTER — Telehealth (HOSPITAL_COMMUNITY): Payer: Self-pay | Admitting: Psychiatry

## 2021-05-07 MED ORDER — QUETIAPINE FUMARATE 25 MG PO TABS
25.0000 mg | ORAL_TABLET | Freq: Every day | ORAL | Status: DC
  Filled 2021-05-07: qty 7

## 2021-05-07 MED ORDER — QUETIAPINE FUMARATE 25 MG PO TABS: 25.0000 mg | ORAL_TABLET | Freq: Every day | ORAL | 0 refills | Status: DC

## 2021-05-07 MED ORDER — SERTRALINE HCL 50 MG PO TABS: 50.0000 mg | ORAL_TABLET | Freq: Every day | ORAL | 0 refills | Status: DC

## 2021-05-07 NOTE — Group Note (Signed)
Group Topic: Decisional Balance/Substance Abuse  Group Date: 05/07/2021 Start Time: 1000 End Time: 1040 Facilitators: Levander Campion  Department: [facility]  Number of Participants: 2  Group Focus: substance abuse education Treatment Modality:  Patient-Centered Therapy Interventions utilized were patient education Purpose: reinforce self-care Group facilitated by AA group, Pt attended.  Name: Gail [name] Date of Birth: 2000/10/17  MR: [id number]    Level of Participation: active Quality of Participation: cooperative Interactions with others: gave feedback Mood/Affect: appropriate Triggers (if applicable):  Cognition: coherent/clear Progress: Minimal Response:  Plan: patient will be encouraged to being encouraged  Patients Problems:  Patient Active Problem List   Diagnosis Date Noted   MDD (major depressive disorder), recurrent, severe, 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/2021   Initiation of Depo Provera 09/24/2018   MDD (major depressive disorder), single episode, severe with psychosis (HCC) 05/03/2018   Severe major depression with psychotic features, mood-congruent (HCC) 04/28/2018   Suicide ideation 04/28/2018   GAD (generalized anxiety disorder) 04/28/2018

## 2021-05-07 NOTE — Discharge Summary (Addendum)
FBC/OBS ASAP Discharge Summary  Date and Time: 05/07/2021 4:48 PM  Name: Gail [name]  MRN:  [id number]   Discharge Diagnoses:  Final diagnoses:  MDD (major depressive disorder), recurrent, severe, with psychosis (HCC)    Original note by student Doctor Pilar Grammes MS3; edited by me as appropriate for completeness and accuracy.    Subjective:  Patient seen and chart reviewed. On presentation this afternoon pt laying in bed and amenable to interview. She denies SI, HI, AVH. She states that her mood is "pretty good". Reports that she feels safe here, and has enjoyed the group sessions and being able to speak with people who listen to her and verbalizes readiness for discharge. Discussed safety plan at length with patient. She reports that if she becomes overwhelmed or experiencing thoughts of self harm she will utilize her coping skills, talk to her mother, call the crisis line and if need be she will call 911 and represent to the hospital.  She expresses interest in returning to the IOP for continued treatment.    Per student interview this AM Pt reports that she has bee in contact with her mother since arrival, who has checked in on her everyday. Lives with mother, but states that her mother is always working and she feels like she does not have anyone to talk to at home. States that her sister has not treated her well recently and does not want to spend time with her, but pt has come to the conclusion that she will not reach out until her sister reaches out to her first. States that her sister has bipolar disorder.    Pt reports that her advisor at [facility] has been very supportive of her, and knows that she is currently at [facility]. States that she now wants to be a Child psychotherapist and will be applying to that program this year. Feels supported at school.  Pt states that the medications have been working well, and she appreciates taking them on a regular schedule, as she often forgot to  take them at home. She is looking forward to peer group sessions with the IOP program through [facility], and states she wants to get involved with activities at school and join the [employer] to play sports she enjoys including volleyball.  Stay Summary:  20 yo F with PMHX of anxiety and depression with psychotic features admitted to [facility] on 9/16. Per chart review, she was reported   SI with plan to "cut my throat", HI towards sister and coworkers, and AVH of a shadow figure and "voice that sounds like noise" during intake for the IOP and it was recommended that she present to the [facility] to get assessed. After assessment, she was admitted to the [facility]. . On 9/17 she  was restarted on home medications of zoloft 25 mg and seroquel 25 mg BID. Zoloft increased to 50 mg on 9/19. Seroquel was going to be increased to 50 mg qhs starting 9/19 due to day time sedation; however patient requested discharge on 9/19. Discussed with patient that wil continue seroquel at 25 mg qhs at this time and defer further adjustments to IOP provider, patient verbalized understanding. See above for day of discharge interview but on day of discharge, denies SI, HI, AVH, and was instructed to follow up with IOP. Pt provided with 7 day samples.    Total Time spent with patient: 15 minutes  Past Psychiatric History: see H&P Past Medical History:  Past Medical History:  Diagnosis Date  Anxiety    Depression    Medical history non-contributory    Vision abnormalities     Past Surgical History:  Procedure Laterality Date   WISDOM TOOTH EXTRACTION     Family History:  Family History  Problem Relation Age of Onset   Rheum arthritis Mother    Lupus Mother    Obesity Mother    Hypertension Mother    Kidney disease Mother    Healthy Sister    Breast cancer Maternal Grandmother    Cancer Maternal Grandmother        breast cancer   Diabetes Maternal Grandmother    Hypertension Maternal Grandmother    Hyperlipidemia Maternal Grandmother     Breast cancer Cousin    Diabetes Father    Family Psychiatric History: see H&P Social History:  Social History   Substance and Sexual Activity  Alcohol Use Yes   Comment: Rare     Social History   Substance and Sexual Activity  Drug Use Yes   Frequency: 3.0 times per week   Types: Marijuana    Social History   Socioeconomic History   Marital status: Single    Spouse name: Not on file   Number of children: Not on file   Years of education: Not on file   Highest education level: Not on file  Occupational History   Not on file  Tobacco Use   Smoking status: Never   Smokeless tobacco: Never  Vaping Use   Vaping Use: Never used  Substance and Sexual Activity   Alcohol use: Yes    Comment: Rare   Drug use: Yes    Frequency: 3.0 times per week    Types: Marijuana   Sexual activity: Not Currently    Birth control/protection: Injection    Comment: 1st intercourse- 16, partners- 2,   Other Topics Concern   Not on file  Social History Narrative   Not on file   Social Determinants of Health   Financial Resource Strain: Not on file  Food Insecurity: Not on file  Transportation Needs: Not on file  Physical Activity: Not on file  Stress: Not on file  Social Connections: Not on file   SDOH:  SDOH Screenings   Alcohol Screen: Not on file  Depression (PHQ2-9): Medium Risk   PHQ-2 Score: 16  Financial Resource Strain: Not on file  Food Insecurity: Not on file  Housing: Not on file  Physical Activity: Not on file  Social Connections: Not on file  Stress: Not on file  Tobacco Use: Low Risk    Smoking Tobacco Use: Never   Smokeless Tobacco Use: Never  Transportation Needs: Not on file    Tobacco Cessation:  N/A, patient does not currently use tobacco products  Current Medications:  Current Facility-Administered Medications  Medication Dose Route Frequency Provider Last Rate Last Admin   acetaminophen (TYLENOL) tablet 650 mg  650 mg Oral Q6H PRN Ajibola, Ene  A, NP       alum & mag hydroxide-simeth (MAALOX/MYLANTA) 200-200-20 MG/5ML suspension 30 mL  30 mL Oral Q4H PRN Ajibola, Ene A, NP       hydrOXYzine (ATARAX/VISTARIL) tablet 25 mg  25 mg Oral TID PRN Ajibola, Ene A, NP       magnesium hydroxide (MILK OF MAGNESIA) suspension 30 mL  30 mL Oral Daily PRN Ajibola, Ene A, NP       QUEtiapine (SEROQUEL) tablet 50 mg  50 mg Oral QHS White, Patrice L, NP  sertraline (ZOLOFT) tablet 50 mg  50 mg Oral Daily White, Patrice L, NP   50 mg at 05/07/21 0930   traZODone (DESYREL) tablet 50 mg  50 mg Oral QHS PRN Ajibola, Ene A, NP       Current Outpatient Medications  Medication Sig Dispense Refill   QUEtiapine (SEROQUEL) 25 MG tablet Take 1 tablet (25 mg total) by mouth at bedtime for 7 days. 7 tablet 0   [START ON 05/08/2021] sertraline (ZOLOFT) 50 MG tablet Take 1 tablet (50 mg total) by mouth daily. 7 tablet 0    PTA Medications: (Not in a hospital admission)   Musculoskeletal  Strength & Muscle Tone: within normal limits Gait & Station: normal Patient leans: N/A  Psychiatric Specialty Exam    Presentation  General Appearance: Appropriate for Environment; Casual   Eye Contact:Fair   Speech:Clear and Coherent; Normal Rate   Speech Volume:Normal   Handedness:Right     Mood and Affect  Mood:Euthymic   Duration of Depression Symptoms: Greater than two weeks   Affect:Appropriate; Congruent     Thought Process  Thought Processes:Coherent; Goal Directed; Linear   Descriptions of Associations:Intact   Orientation:Full (Time, Place and Person)   Thought Content:Logical; WDL   History of Schizophrenia/Schizoaffective disorder:Yes   Duration of Psychotic Symptoms:Greater than six months   Hallucinations:Hallucinations: None   Ideas of Reference:None   Suicidal Thoughts:Suicidal Thoughts: No   Homicidal Thoughts:Homicidal Thoughts: No     Sensorium  Memory:Immediate Good; Recent Good; Remote Fair   Judgment:Fair    Insight:Fair     Executive Functions  Concentration:Fair   Attention Span:Fair   Recall:Fair   Fund of Knowledge:Fair   Language:Fair     Psychomotor Activity  Psychomotor Activity:Psychomotor Activity: Normal     Assets  Assets:Communication Skills; Desire for Improvement; Social Support; Health and safety inspector; Vocational/Educational; Housing     Sleep  Sleep:Sleep: Fair     Physical Exam   See SRA for physical exam and ROS  Blood pressure 103/60, pulse 83, temperature 97.7 F (36.5 C), temperature source Oral, resp. rate 16, SpO2 100 %. There is no height or weight on file to calculate BMI.  See SRA for full suicide risk assessment  Plan Of Care/Follow-up recommendations:  Activity: as tolerated Diet:  Regular Other:   IOP follow up appointment scheduled for tomorrow. Pt provided with 7 day supply of medications (Zoloft 50 mg, Seroquel 25 mg). Plan to attend IOP appointment and follow recommendations.  - Take all medications as prescribed. - Keep all follow-up appointments as scheduled.  - Do not consume alcohol or use illegal drugs while on prescription medications. - Report any adverse effects from your medications to your primary care provider promptly.  - In the event of recurrent symptoms or worsening symptoms, call 911, a crisis hotline, or go to the nearest emergency department for evaluation.    TAKE these medications    QUEtiapine 25 MG tablet Commonly known as: SEROQUEL Take 1 tablet (25 mg total) by mouth at bedtime for 7 days. What changed: when to take this   sertraline 50 MG tablet Commonly known as: ZOLOFT Take 1 tablet (50 mg total) by mouth daily. Start taking on: May 08, 2021 What changed:  medication strength See the new instructions.          Disposition: Home, lives with mother   Estella Husk, MD 05/07/2021, 4:48 PM

## 2021-05-07 NOTE — ED Notes (Signed)
Pt ate breakfast and was seen interacting/ talking with other residents.  Pt reports she feels safe here and that she is not feeling any SI/ HI.  Pt reports she thinks her SI/HI thoughts are environmentally based.  Pt currently in dining area reading quietly. Will continue to monitor for safety.

## 2021-05-07 NOTE — Clinical Social Work Psych Note (Signed)
CSW Note  Taylor shared that she came to the [facility] due to increasing suicidal ideations, anxiety and depressive symptoms. Ronniesha reports she cannot identify any triggering events, persons or situations, that led to her hospitalization.   Malva shared that she feels "overwhelmed" and "pressured" by society. She states that  "having to grow up" was her main issue. She does not feel as though she is supported, as she should be.   Shiva mentioned that her parents work often, and at times she feels that they make excuses, so they do not have to "deal with" her.   Gerarda reports she currently sees Jeri Modena in [facility] IOP and would like to resume services once she is discharged from the [facility]. Khara plans to return home with her mother and family at discharge.   CSW will continue to follow until discharge.      Baldo Daub, MSW, LCSW Clinical Child psychotherapist (Facility Based Crisis) [facility]

## 2021-05-07 NOTE — ED Notes (Signed)
Pt discharged with  AVS.  AVS reviewed prior to discharge.  Pt alert, oriented, and ambulatory. All belongings were returned at discharge.  Safety maintained.

## 2021-05-07 NOTE — ED Notes (Signed)
Patient resting quietly in bed with eyes closed no signs/symptoms of distress. Respirations even and unlabored. Will continue to monitor.

## 2021-05-07 NOTE — Group Note (Signed)
Group Topic: Decisional Balance/Substance Abuse  Group Date: 05/07/2021 Start Time: 1000 End Time: 1045 Facilitators: AA AL AQSA WEEKLY PROVIDER; Rico Sheehan, LPN  Department: [facility]  Number of Participants: 2  Group Focus: chemical dependency issues, community group, and substance abuse education Treatment Modality:  Patient-Centered Therapy Interventions utilized were support Purpose: explore maladaptive thinking and increase insight  Name: Gail [name] Date of Birth: [birth year]  MR: [id number]    Level of Participation: active Quality of Participation: cooperative Interactions with others: gave feedback Mood/Affect: appropriate Triggers (if applicable):  Cognition: coherent/clear Progress: Minimal Response:  Plan: patient will be encouraged to continue attending groups and participate in group  Patients Problems:  Patient Active Problem List   Diagnosis Date Noted   MDD (major depressive disorder), recurrent, severe, 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/2021   Initiation of Depo Provera 09/24/2018   MDD (major depressive disorder), single episode, severe with psychosis (HCC) 05/03/2018   Severe major depression with psychotic features, mood-congruent (HCC) 04/28/2018   Suicide ideation 04/28/2018   GAD (generalized anxiety disorder) 04/28/2018

## 2021-05-07 NOTE — Progress Notes (Addendum)
Behavioral Health Progress Note  Date and Time: 05/07/2021 9:52 AM Name: Gail [name] MRN:  [id number]  Subjective:  Patient seen and chart reviewed. On presentation, pt in dining room, reading a book. This morning, denies SI, HI, AVH. She states that her mood is "pretty good". Reports that she feels safe here, and has enjoyed the group sessions and being able to speak with people who listen to her. A&O x 4, thought process linear and goal directed.  Pt reports that she has bee in contact with her mother since arrival, who has checked in on her everyday. Lives with mother, but states that her mother is always working and she feels like she does not have anyone to talk to at home. States that her sister has not treated her well recently and does not want to spend time with her, but pt has come to the conclusion that she will not reach out until her sister reaches out to her first. States that her sister has bipolar disorder.   Pt reports that her advisor at [facility] has been very supportive of her, and knows that she is currently at [facility] States that she now wants to be a Child psychotherapist and will be applying to that program this year. Feels supported at school.   Pt states that the medications have been working well, and she appreciates taking them on a regular schedule, as she often forgot to take them at home. She is looking forward to peer group sessions with the IOP program through [facility].  Diagnosis:  Final diagnoses:  MDD (major depressive disorder), recurrent, severe, with psychosis (HCC)    Total Time spent with patient: 15 minutes  Past Psychiatric History: see H&P Past Medical History:  Past Medical History:  Diagnosis Date   Anxiety    Depression    Medical history non-contributory    Vision abnormalities     Past Surgical History:  Procedure Laterality Date   WISDOM TOOTH EXTRACTION     Family History:  Family History  Problem Relation Age of Onset   Rheum  arthritis Mother    Lupus Mother    Obesity Mother    Hypertension Mother    Kidney disease Mother    Healthy Sister    Breast cancer Maternal Grandmother    Cancer Maternal Grandmother        breast cancer   Diabetes Maternal Grandmother    Hypertension Maternal Grandmother    Hyperlipidemia Maternal Grandmother    Breast cancer Cousin    Diabetes Father    Family Psychiatric  History: see H&P Social History:  Social History   Substance and Sexual Activity  Alcohol Use Yes   Comment: Rare     Social History   Substance and Sexual Activity  Drug Use Yes   Frequency: 3.0 times per week   Types: Marijuana    Social History   Socioeconomic History   Marital status: Single    Spouse name: Not on file   Number of children: Not on file   Years of education: Not on file   Highest education level: Not on file  Occupational History   Not on file  Tobacco Use   Smoking status: Never   Smokeless tobacco: Never  Vaping Use   Vaping Use: Never used  Substance and Sexual Activity   Alcohol use: Yes    Comment: Rare   Drug use: Yes    Frequency: 3.0 times per week    Types: Marijuana  Sexual activity: Not Currently    Birth control/protection: Injection    Comment: 1st intercourse- 16, partners- 2,   Other Topics Concern   Not on file  Social History Narrative   Not on file   Social Determinants of Health   Financial Resource Strain: Not on file  Food Insecurity: Not on file  Transportation Needs: Not on file  Physical Activity: Not on file  Stress: Not on file  Social Connections: Not on file   SDOH:  SDOH Screenings   Alcohol Screen: Not on file  Depression (PHQ2-9): Medium Risk   PHQ-2 Score: 16  Financial Resource Strain: Not on file  Food Insecurity: Not on file  Housing: Not on file  Physical Activity: Not on file  Social Connections: Not on file  Stress: Not on file  Tobacco Use: Low Risk    Smoking Tobacco Use: Never   Smokeless Tobacco Use:  Never  Transportation Needs: Not on file   Additional Social History:    Pain Medications: See MAR Prescriptions: See MAR Over the Counter: See MAR History of alcohol / drug use?: Yes Longest period of sobriety (when/how long): Unknown Negative Consequences of Use:  (None noted) Withdrawal Symptoms: None Name of Substance 1: CBD 1 - Age of First Use: Unknown 1 - Amount (size/oz): Several hits of a vape 1 - Frequency: 2x/week 1 - Duration: Unknown 1 - Last Use / Amount: Earlier this week 1 - Method of Aquiring: Purchase 1- Route of Use: Oral (Smoke)                  Sleep: Fair  Appetite:  Fair  Current Medications:  Current Facility-Administered Medications  Medication Dose Route Frequency Provider Last Rate Last Admin   acetaminophen (TYLENOL) tablet 650 mg  650 mg Oral Q6H PRN Ajibola, Ene A, NP       alum & mag hydroxide-simeth (MAALOX/MYLANTA) 200-200-20 MG/5ML suspension 30 mL  30 mL Oral Q4H PRN Ajibola, Ene A, NP       hydrOXYzine (ATARAX/VISTARIL) tablet 25 mg  25 mg Oral TID PRN Ajibola, Ene A, NP       magnesium hydroxide (MILK OF MAGNESIA) suspension 30 mL  30 mL Oral Daily PRN Ajibola, Ene A, NP       QUEtiapine (SEROQUEL) tablet 50 mg  50 mg Oral QHS White, Patrice L, NP       sertraline (ZOLOFT) tablet 50 mg  50 mg Oral Daily White, Patrice L, NP   50 mg at 05/07/21 0930   traZODone (DESYREL) tablet 50 mg  50 mg Oral QHS PRN Ajibola, Ene A, NP       Current Outpatient Medications  Medication Sig Dispense Refill   QUEtiapine (SEROQUEL) 25 MG tablet Take 1 tablet (25 mg total) by mouth 2 (two) times daily. 30 tablet 0   sertraline (ZOLOFT) 25 MG tablet Take 1 tablet (25 mg total) by mouth daily for 3 days, THEN 2 tablets (50 mg total) daily. 30 tablet 0   ARIPiprazole (ABILIFY) 5 MG tablet Take 1 tablet (5 mg total) by mouth daily. (Patient not taking: No sig reported) 30 tablet 0   famotidine (PEPCID) 20 MG tablet Take 1 tablet (20 mg total) by mouth 2  (two) times daily. (Patient not taking: No sig reported) 60 tablet 6   medroxyPROGESTERone (DEPO-PROVERA) 150 MG/ML injection Inject 1 mL (150 mg total) into the muscle every 3 (three) months. (Patient not taking: No sig reported) 1 mL 4  Labs  Lab Results:  Admission on 05/04/2021  Component Date Value Ref Range Status   SARS Coronavirus 2 by RT PCR 05/05/2021 NEGATIVE  NEGATIVE Final   Comment: (NOTE) SARS-CoV-2 target nucleic acids are NOT DETECTED.  The SARS-CoV-2 RNA is generally detectable in upper respiratory specimens during the acute phase of infection. The lowest concentration of SARS-CoV-2 viral copies this assay can detect is 138 copies/mL. A negative result does not preclude SARS-Cov-2 infection and should not be used as the sole basis for treatment or other patient management decisions. A negative result may occur with  improper specimen collection/handling, submission of specimen other than nasopharyngeal swab, presence of viral mutation(s) within the areas targeted by this assay, and inadequate number of viral copies(<138 copies/mL). A negative result must be combined with clinical observations, patient history, and epidemiological information. The expected result is Negative.  Fact Sheet for Patients:  BloggerCourse.com  Fact Sheet for Healthcare Providers:  SeriousBroker.it  This test is no                          t yet approved or cleared by the [place] FDA and  has been authorized for detection and/or diagnosis of SARS-CoV-2 by FDA under an Emergency Use Authorization (EUA). This EUA will remain  in effect (meaning this test can be used) for the duration of the COVID-19 declaration under Section 564(b)(1) of the Act, 21 U.S.C.section 360bbb-3(b)(1), unless the authorization is terminated  or revoked sooner.       Influenza A by PCR 05/05/2021 NEGATIVE  NEGATIVE Final   Influenza B by PCR  05/05/2021 NEGATIVE  NEGATIVE Final   Comment: (NOTE) The Xpert Xpress SARS-CoV-2/FLU/RSV plus assay is intended as an aid in the diagnosis of influenza from Nasopharyngeal swab specimens and should not be used as a sole basis for treatment. Nasal washings and aspirates are unacceptable for Xpert Xpress SARS-CoV-2/FLU/RSV testing.  Fact Sheet for Patients: BloggerCourse.com  Fact Sheet for Healthcare Providers: SeriousBroker.it  This test is not yet approved or cleared by the [place] FDA and has been authorized for detection and/or diagnosis of SARS-CoV-2 by FDA under an Emergency Use Authorization (EUA). This EUA will remain in effect (meaning this test can be used) for the duration of the COVID-19 declaration under Section 564(b)(1) of the Act, 21 U.S.C. section 360bbb-3(b)(1), unless the authorization is terminated or revoked.  Performed at [facility], [address], [place], [place] [address]    WBC 05/05/2021 6.8  4.0 - 10.5 K/uL Final   RBC 05/05/2021 4.28  3.87 - 5.11 MIL/uL Final   Hemoglobin 05/05/2021 13.1  12.0 - 15.0 g/dL Final   HCT 65/46/5035 40.2  36.0 - 46.0 % Final   MCV 05/05/2021 93.9  80.0 - 100.0 fL Final   MCH 05/05/2021 30.6  26.0 - 34.0 pg Final   MCHC 05/05/2021 32.6  30.0 - 36.0 g/dL Final   RDW 46/56/8127 12.1  11.5 - 15.5 % Final   Platelets 05/05/2021 342  150 - 400 K/uL Final   nRBC 05/05/2021 0.0  0.0 - 0.2 % Final   Neutrophils Relative % 05/05/2021 45  % Final   Neutro Abs 05/05/2021 3.1  1.7 - 7.7 K/uL Final   Lymphocytes Relative 05/05/2021 47  % Final   Lymphs Abs 05/05/2021 3.1  0.7 - 4.0 K/uL Final   Monocytes Relative 05/05/2021 5  % Final   Monocytes Absolute 05/05/2021 0.4  0.1 -  1.0 K/uL Final   Eosinophils Relative 05/05/2021 3  % Final   Eosinophils Absolute 05/05/2021 0.2  0.0 - 0.5 K/uL Final   Basophils Relative 05/05/2021 0  % Final   Basophils Absolute  05/05/2021 0.0  0.0 - 0.1 K/uL Final   Immature Granulocytes 05/05/2021 0  % Final   Abs Immature Granulocytes 05/05/2021 0.02  0.00 - 0.07 K/uL Final   Performed at [facility], [address] [place], [place] [address]   Sodium 05/05/2021 136  135 - 145 mmol/L Final   Potassium 05/05/2021 3.6  3.5 - 5.1 mmol/L Final   Chloride 05/05/2021 107  98 - 111 mmol/L Final   CO2 05/05/2021 22  22 - 32 mmol/L Final   Glucose, Bld 05/05/2021 83  70 - 99 mg/dL Final   Glucose reference range applies only to samples taken after fasting for at least 8 hours.   BUN 05/05/2021 7  6 - 20 mg/dL Final   Creatinine, Ser 05/05/2021 0.69  0.44 - 1.00 mg/dL Final   Calcium 23/76/2831 9.3  8.9 - 10.3 mg/dL Final   Total Protein 51/76/1607 6.8  6.5 - 8.1 g/dL Final   Albumin 37/05/6268 3.8  3.5 - 5.0 g/dL Final   AST 48/54/6270 17  15 - 41 U/L Final   ALT 05/05/2021 17  0 - 44 U/L Final   Alkaline Phosphatase 05/05/2021 51  38 - 126 U/L Final   Total Bilirubin 05/05/2021 1.3 (A) 0.3 - 1.2 mg/dL Final   GFR, Estimated 05/05/2021 >60  >60 mL/min Final   Comment: (NOTE) Calculated using the CKD-EPI Creatinine Equation (2021)    Anion gap 05/05/2021 7  5 - 15 Final   Performed at [facility], [address] [place], [place] [address]   Hgb A1c MFr Bld 05/05/2021 4.5 (A) 4.8 - 5.6 % Final   Comment: (NOTE) Pre diabetes:          5.7%-6.4%  Diabetes:              >6.4%  Glycemic control for   <7.0% adults with diabetes    Mean Plasma Glucose 05/05/2021 82.45  mg/dL Final   Performed at [facility], [address] [place], [place] [address]   Cholesterol 05/05/2021 121  0 - 200 mg/dL Final   Triglycerides 99/37/1696 57  <150 mg/dL Final   HDL 78/93/8101 52  >40 mg/dL Final   Total CHOL/HDL Ratio 05/05/2021 2.3  RATIO Final   VLDL 05/05/2021 11  0 - 40 mg/dL Final   LDL Cholesterol 05/05/2021 58  0 - 99 mg/dL Final   Comment:        Total Cholesterol/HDL:CHD Risk Coronary Heart  Disease Risk Table                     Men   Women  1/2 Average Risk   3.4   3.3  Average Risk       5.0   4.4  2 X Average Risk   9.6   7.1  3 X Average Risk  23.4   11.0        Use the calculated Patient Ratio above and the CHD Risk Table to determine the patient's CHD Risk.        ATP III CLASSIFICATION (LDL):  <100     mg/dL   Optimal  751-025  mg/dL   Near or Above  Optimal  130-159  mg/dL   Borderline  253-664  mg/dL   High  >403     mg/dL   Very High Performed at [facility], [address], [place], [place] [address]    TSH 05/05/2021 2.727  0.350 - 4.500 uIU/mL Final   Comment: Performed by a 3rd Generation assay with a functional sensitivity of <=0.01 uIU/mL. Performed at [facility], [address], [place], [place] [address]    SARS Coronavirus 2 Ag 05/05/2021 Negative  Negative Final   POC Amphetamine UR 05/05/2021 None Detected  NONE DETECTED (Cut Off Level 1000 ng/mL) Final   POC Secobarbital (BAR) 05/05/2021 None Detected  NONE DETECTED (Cut Off Level 300 ng/mL) Final   POC Buprenorphine (BUP) 05/05/2021 None Detected  NONE DETECTED (Cut Off Level 10 ng/mL) Final   POC Oxazepam (BZO) 05/05/2021 None Detected  NONE DETECTED (Cut Off Level 300 ng/mL) Final   POC Cocaine UR 05/05/2021 None Detected  NONE DETECTED (Cut Off Level 300 ng/mL) Final   POC Methamphetamine UR 05/05/2021 None Detected  NONE DETECTED (Cut Off Level 1000 ng/mL) Final   POC Morphine 05/05/2021 None Detected  NONE DETECTED (Cut Off Level 300 ng/mL) Final   POC Oxycodone UR 05/05/2021 None Detected  NONE DETECTED (Cut Off Level 100 ng/mL) Final   POC Methadone UR 05/05/2021 None Detected  NONE DETECTED (Cut Off Level 300 ng/mL) Final   POC Marijuana UR 05/05/2021 None Detected  NONE DETECTED (Cut Off Level 50 ng/mL) Final   Preg Test, Ur 05/05/2021 NEGATIVE  NEGATIVE Final   Comment:        THE SENSITIVITY OF THIS METHODOLOGY IS >24 mIU/mL    SARSCOV2ONAVIRUS 2  AG 05/05/2021 NEGATIVE  NEGATIVE Final   Comment: (NOTE) SARS-CoV-2 antigen NOT DETECTED.   Negative results are presumptive.  Negative results do not preclude SARS-CoV-2 infection and should not be used as the sole basis for treatment or other patient management decisions, including infection  control decisions, particularly in the presence of clinical signs and  symptoms consistent with COVID-19, or in those who have been in contact with the virus.  Negative results must be combined with clinical observations, patient history, and epidemiological information. The expected result is Negative.  Fact Sheet for Patients: https://www.jennings-kim.com/  Fact Sheet for Healthcare Providers: https://alexander-rogers.biz/  This test is not yet approved or cleared by the [place] FDA and  has been authorized for detection and/or diagnosis of SARS-CoV-2 by FDA under an Emergency Use Authorization (EUA).  This EUA will remain in effect (meaning this test can be used) for the duration of  the COV                          ID-19 declaration under Section 564(b)(1) of the Act, 21 U.S.C. section 360bbb-3(b)(1), unless the authorization is terminated or revoked sooner.    Orders Only on 04/25/2021  Component Date Value Ref Range Status   hCG,Beta Subunit,Qual,Serum 04/25/2021 Negative  Negative <6 mIU/mL Final  Clinical Support on 02/20/2021  Component Date Value Ref Range Status   Preg Test, Ur 02/20/2021 Negative  Negative Final  Office Visit on 01/02/2021  Component Date Value Ref Range Status   WBC 01/02/2021 6.2  4.5 - 10.5 K/uL Final   RBC 01/02/2021 4.34  3.87 - 5.11 Mil/uL Final   Hemoglobin 01/02/2021 13.7  12.0 - 15.0 g/dL Final   HCT 75/64/3329 40.6  36.0 - 46.0 %  Final   MCV 01/02/2021 93.5  78.0 - 100.0 fl Final   MCHC 01/02/2021 33.9  30.0 - 36.0 g/dL Final   RDW 09/81/1914 12.7  11.5 - 14.6 % Final   Platelets 01/02/2021 275.0  150.0 - 400.0 K/uL  Final   Neutrophils Relative % 01/02/2021 47.6  43.0 - 77.0 % Final   Lymphocytes Relative 01/02/2021 43.2  12.0 - 46.0 % Final   Monocytes Relative 01/02/2021 5.5  3.0 - 12.0 % Final   Eosinophils Relative 01/02/2021 3.2  0.0 - 5.0 % Final   Basophils Relative 01/02/2021 0.5  0.0 - 3.0 % Final   Neutro Abs 01/02/2021 2.9  1.4 - 7.7 K/uL Final   Lymphs Abs 01/02/2021 2.7  0.7 - 4.0 K/uL Final   Monocytes Absolute 01/02/2021 0.3  0.1 - 1.0 K/uL Final   Eosinophils Absolute 01/02/2021 0.2  0.0 - 0.7 K/uL Final   Basophils Absolute 01/02/2021 0.0  0.0 - 0.1 K/uL Final   Sodium 01/02/2021 136  135 - 145 mEq/L Final   Potassium 01/02/2021 3.7  3.5 - 5.1 mEq/L Final   Chloride 01/02/2021 105  96 - 112 mEq/L Final   CO2 01/02/2021 24  19 - 32 mEq/L Final   Glucose, Bld 01/02/2021 89  70 - 99 mg/dL Final   BUN 78/29/5621 8  6 - 23 mg/dL Final   Creatinine, Ser 01/02/2021 0.81  0.40 - 1.20 mg/dL Final   Total Bilirubin 01/02/2021 1.6 (A) 0.2 - 1.2 mg/dL Final   Alkaline Phosphatase 01/02/2021 48  39 - 117 U/L Final   AST 01/02/2021 13  0 - 37 U/L Final   ALT 01/02/2021 12  0 - 35 U/L Final   Total Protein 01/02/2021 6.6  6.0 - 8.3 g/dL Final   Albumin 30/86/5784 4.2  3.5 - 5.2 g/dL Final   GFR 69/62/9528 104.44  >60.00 mL/min Final   Calculated using the CKD-EPI Creatinine Equation (2021)   Calcium 01/02/2021 9.5  8.4 - 10.5 mg/dL Final   TSH 41/32/4401 1.96  0.35 - 5.50 uIU/mL Final  Office Visit on 11/29/2020  Component Date Value Ref Range Status   Color, Urine 11/29/2020 YELLOW  YELLOW Final   APPearance 11/29/2020 CLEAR  CLEAR Final   Specific Gravity, Urine 11/29/2020 1.025  1.001 - 1.03 Final   pH 11/29/2020 6.5  5.0 - 8.0 Final   Glucose, UA 11/29/2020 NEGATIVE  NEGATIVE Final   Bilirubin Urine 11/29/2020 NEGATIVE  NEGATIVE Final   Ketones, ur 11/29/2020 NEGATIVE  NEGATIVE Final   Hgb urine dipstick 11/29/2020 2+ (A) NEGATIVE Final   Protein, ur 11/29/2020 NEGATIVE  NEGATIVE  Final   Nitrites, Initial 11/29/2020 NEGATIVE  NEGATIVE Final   Leukocyte Esterase 11/29/2020 NEGATIVE  NEGATIVE Final   WBC, UA 11/29/2020 0-5  0 - 5 /HPF Final   RBC / HPF 11/29/2020 3-10 (A) 0 - 2 /HPF Final   Squamous Epithelial / LPF 11/29/2020 6-10 (A) < OR = 5 /HPF Final   Bacteria, UA 11/29/2020 MODERATE (A) NONE SEEN /HPF Final   Hyaline Cast 11/29/2020 NONE SEEN  NONE SEEN /LPF Final   Note 11/29/2020    Final   Comment: This urine was analyzed for the presence of WBC,  RBC, bacteria, casts, and other formed elements.  Only those elements seen were reported. . Nile Riggs NUMBER: 11/29/2020 [id number]   Final   SPECIMEN QUALITY: 11/29/2020 Adequate   Final   Sample Source 11/29/2020 URINE   Final  STATUS: 11/29/2020 FINAL   Final   Result: 11/29/2020    Final                   Value:Mixed genital flora isolated. These superficial bacteria are not indicative of a urinary tract infection. No further organism identification is warranted on this specimen. If clinically indicated, recollect clean-catch, mid-stream urine and transfer  immediately to Urine Culture Transport Tube.    COMMENT: 11/29/2020    Final                   Value:Due to supply chain limits, the laboratory is temporarily performing urine cultures from FDA approved preservative tubes that have not been validated by [facility], as well as from refrigerated sterile urine collection cups that do not contain a  preservative. Culture of unpreserved urine may produce falsely elevated bacterial counts.    REFLEXIVE URINE CULTURE 11/29/2020    Final   CULTURE INDICATED - RESULTS TO FOLLOW    Blood Alcohol level:  No results found for: [facility]  Metabolic Disorder Labs: Lab Results  Component Value Date   HGBA1C 4.5 (L) 05/05/2021   MPG 82.45 05/05/2021   MPG 91 04/28/2018   Lab Results  Component Value Date   PROLACTIN 74.5 (H) 04/28/2018   Lab Results  Component Value Date   CHOL 121 05/05/2021   TRIG  57 05/05/2021   HDL 52 05/05/2021   CHOLHDL 2.3 05/05/2021   VLDL 11 05/05/2021   LDLCALC 58 05/05/2021   LDLCALC 62 04/28/2018    Therapeutic Lab Levels: No results found for: LITHIUM No results found for: VALPROATE No components found for:  CBMZ  Physical Findings   AIMS    Flowsheet Row Admission (Discharged) from [facility]  AIMS Total Score 0      GAD-7    Flowsheet Row Office Visit from 01/02/2021 in [facility] Office Visit from 06/07/2019 in [facility] Office Visit from 09/23/2018 [facility]  Total GAD-7 Score 9 14 6       PHQ2-9    Flowsheet Row ED from 05/04/2021 in [facility] Counselor from 04/25/2021 in [facility] Office Visit from 01/02/2021 in [facility] Counselor from 11/06/2020 in [facility] Office Visit from 06/07/2019 in [facility]  PHQ-2 Total Score 5 4 2 6 3   PHQ-9 Total Score 16 15 7 22 16       Flowsheet Row ED from 05/04/2021 in [facility] Counselor from 04/25/2021 in [facility] Counselor from 11/06/2020 in [facility] PSY[facility]  C-SSRS RISK CATEGORY High Risk Error: Question 6 not populated Error: Question 6 not populated        Musculoskeletal  Strength & Muscle Tone: within normal limits Gait & Station: normal Patient leans: N/A  Psychiatric Specialty Exam  Presentation  General Appearance: Appropriate for Environment, casual, reading in dining room   Eye Contact:Fair  Speech:Clear and Coherent  Speech Volume:Normal  Handedness:Right   Mood and Affect  Mood: "feel pretty good, on the happier side"   Affect:Congruent   Thought Process  Thought Processes:Coherent  Descriptions of Associations:Intact  Orientation:Full  (Time, Place and Person)  Thought Content:Logical  Diagnosis of Schizophrenia or Schizoaffective disorder in past: No  Duration of Psychotic Symptoms: Greater than six months   Hallucinations:Hallucinations: None  Ideas of  Reference:None  Suicidal Thoughts:Suicidal Thoughts: No  Homicidal Thoughts:Homicidal Thoughts: No   Sensorium  Memory:Immediate Fair; Remote Fair; Recent Fair  Judgment:Fair  Insight: Good   Executive Functions  Concentration:Fair  Attention Span:Fair  Recall:Fair  Fund of Knowledge:Fair  Language:Fair   Psychomotor Activity  Psychomotor Activity: No data recorded  Assets  Assets:Communication Skills; Desire for Improvement; Financial Resources/Insurance; Housing; Leisure Time   Sleep  Sleep:Sleep: Fair Number of Hours of Sleep: 8   No data recorded  Physical Exam  Physical Exam Constitutional:      Appearance: Normal appearance.  HENT:     Head: Normocephalic.  Eyes:     Extraocular Movements: Extraocular movements intact.  Pulmonary:     Effort: Pulmonary effort is normal.  Musculoskeletal:        General: Normal range of motion.     Cervical back: Normal range of motion.  Neurological:     General: No focal deficit present.     Mental Status: She is alert and oriented to person, place, and time.  Psychiatric:        Thought Content: Thought content normal.   Review of Systems  Constitutional:  Negative for fever.  Gastrointestinal:  Negative for abdominal pain, nausea and vomiting.  Neurological:  Negative for headaches.  Psychiatric/Behavioral:  Positive for depression. Negative for hallucinations, substance abuse and suicidal ideas.   Blood pressure 103/60, pulse 83, temperature 97.7 F (36.5 C), temperature source Oral, resp. rate 16, SpO2 100 %. There is no height or weight on file to calculate BMI.  Treatment Plan Summary:  20 yo F with PMHX of anxiety and depression with psychotic features admitted to [facility] on  9/16, and initially endorsed SI, HI, and AVH. Labs reviewed - CBC, CMP, UDS wnl, EKG with normal sinus rhythm. On presentation today, denies SI, HI, AVH. Tolerating zoloft and seroquel well, with no noticeable side effects. Previously enrolled in PHP with [facility] once this year already, received second  orientation 9/16. At this time, pt appropriate for continued care at [facility], dispo pending.   Prior home medications:  QUEtiapine  25 mg Oral BID   sertraline  25 mg Oral Daily    Anxiety and depression:  - will increase Zoloft (sertraline) to 50 mg daily starting 05/07/21 for depression  - will change Seroquel (quetiapine) to 50 mg QHS starting 05/07/21 to reduce daytime sleepiness    Dispo: ongoing. patient is currently enrolled in the PHP. Plan to meet with CSW today.  Carolanne Grumbling, Medical Student 05/07/2021 10:19 AM

## 2021-05-07 NOTE — ED Notes (Signed)
Pt in dining area eating lunch.  Pt voiced that she is ready to go home soon.  Notified physician that pt states she feels ready to go home.  Will continue to monitor for safety.

## 2021-05-07 NOTE — ED Notes (Signed)
Sitting in dining room speaking/ interacting with other pts.

## 2021-05-07 NOTE — ED Notes (Signed)
Pt declined to participate in [facility] recreation group.  

## 2021-05-07 NOTE — ED Notes (Signed)
Pt spoke with her sister about being discharged home.  Sister will be picking pt up at 1600.  Sample medications have been readied and will be sent home with pt at discharge.  Will continue to monitor for safety.

## 2021-05-07 NOTE — ED Provider Notes (Signed)
[facility] Discharge Suicide Risk Assessment   Principal Problem: MDD (major depressive disorder), recurrent, severe, with psychosis (HCC) Discharge Diagnoses: Principal Problem:   MDD (major depressive disorder), recurrent, severe, with psychosis (HCC)   Total Time spent with patient: 15 minutes  Musculoskeletal: Strength & Muscle Tone: within normal limits Gait & Station: normal Patient leans: N/A  Psychiatric Specialty Exam  Presentation  General Appearance: Appropriate for Environment; Casual  Eye Contact:Fair  Speech:Clear and Coherent; Normal Rate  Speech Volume:Normal  Handedness:Right   Mood and Affect  Mood:Euthymic  Duration of Depression Symptoms: Greater than two weeks  Affect:Appropriate; Congruent   Thought Process  Thought Processes:Coherent; Goal Directed; Linear  Descriptions of Associations:Intact  Orientation:Full (Time, Place and Person)  Thought Content:Logical; WDL  History of Schizophrenia/Schizoaffective disorder:Yes  Duration of Psychotic Symptoms:Greater than six months  Hallucinations:Hallucinations: None  Ideas of Reference:None  Suicidal Thoughts:Suicidal Thoughts: No  Homicidal Thoughts:Homicidal Thoughts: No   Sensorium  Memory:Immediate Good; Recent Good; Remote Fair  Judgment:Fair  Insight:Fair   Executive Functions  Concentration:Fair  Attention Span:Fair  Recall:Fair  Fund of Knowledge:Fair  Language:Fair   Psychomotor Activity  Psychomotor Activity:Psychomotor Activity: Normal   Assets  Assets:Communication Skills; Desire for Improvement; Social Support; Health and safety inspector; Vocational/Educational; Housing   Sleep  Sleep:Sleep: Fair   Physical Exam: Physical Exam Constitutional:      Appearance: Normal appearance.  HENT:     Head: Normocephalic and atraumatic.  Eyes:     Extraocular Movements: Extraocular movements intact.  Pulmonary:     Effort: Pulmonary effort is normal.   Neurological:     General: No focal deficit present.     Mental Status: She is alert and oriented to person, place, and time.   Review of Systems  Constitutional:  Negative for chills and fever.  HENT:  Negative for hearing loss.   Eyes:  Negative for discharge and redness.  Respiratory:  Negative for cough.   Cardiovascular:  Negative for chest pain.  Gastrointestinal:  Negative for abdominal pain.  Musculoskeletal:  Negative for myalgias.  Neurological:  Negative for headaches.  Blood pressure 103/60, pulse 83, temperature 97.7 F (36.5 C), temperature source Oral, resp. rate 16, SpO2 100 %. There is no height or weight on file to calculate BMI.  Mental Status Per Nursing Assessment::   On Admission:     Demographic Factors:  Adolescent or young adult and Caucasian  Loss Factors: Grandfather passing away in July 2022   Historical Factors: Impulsivity and previous hospitalization  Risk Reduction Factors:   Sense of responsibility to family, Living with another person, especially a relative, Positive social support, and Positive coping skills or problem solving skills  Continued Clinical Symptoms:  Depression:   Recent sense of peace/wellbeing Previous Psychiatric Diagnoses and Treatments  Cognitive Features That Contribute To Risk:  None    Suicide Risk:  Minimal: No identifiable suicidal ideation.  Patients presenting with no risk factors but with morbid ruminations; may be classified as minimal risk based on the severity of the depressive symptoms   Follow-up Information     [facility]. Call.   Specialty: Behavioral Health Why: Your next IOP appointment with Jeri Modena is tomorrow, Tuesday, 05/08/21 at 9:00am. A Webex link will be sent to your email at least 30 minutes before the group session. Contact information: [address] [place] [address] [phone]                Plan Of  Care/Follow-up  recommendations:  Activity:  as tolerated Diet:  regular Other:     Patient is instructed prior to discharge to: Take all medications as prescribed by his/her mental healthcare provider. Report any adverse effects and or reactions from the medicines to his/her outpatient provider promptly. Patient has been instructed & cautioned: To not engage in alcohol and or illegal drug use while on prescription medicines. In the event of worsening symptoms, patient is instructed to call the crisis hotline, 911 and or go to the nearest ED for appropriate evaluation and treatment of symptoms. To follow-up with his/her primary care provider for your other medical issues, concerns and or health care needs.  Patient has follow up tomorrow with IOP as above  TAKE these medications    QUEtiapine 25 MG tablet Commonly known as: SEROQUEL Take 1 tablet (25 mg total) by mouth at bedtime for 7 days. What changed: when to take this   sertraline 50 MG tablet Commonly known as: ZOLOFT Take 1 tablet (50 mg total) by mouth daily. Start taking on: May 08, 2021 What changed:  medication strength See the new instructions.         Estella Husk, MD 05/07/2021, 4:21 PM

## 2021-05-07 NOTE — ED Notes (Signed)
Currently participating in morning group.  Will continue to monitor for safety.

## 2021-05-07 NOTE — Discharge Instructions (Signed)
Take all medications as prescribed by his/her mental healthcare provider. Report any adverse effects and or reactions from the medicines to his/her outpatient provider promptly. Do not engage in alcohol and or illegal drug use while on prescription medicines. In the event of worsening symptoms, patient is instructed to call the crisis hotline, 911 and or go to the nearest ED for appropriate evaluation and treatment of symptoms. To follow-up with his/her primary care provider for your other medical issues, concerns and or health care needs.   Please follow up with IOP appointment tomorrow.

## 2021-05-08 ENCOUNTER — Other Ambulatory Visit: Payer: Self-pay

## 2021-05-08 ENCOUNTER — Other Ambulatory Visit (HOSPITAL_COMMUNITY): Payer: 59 | Admitting: Professional

## 2021-05-08 ENCOUNTER — Ambulatory Visit: Payer: 59

## 2021-05-08 DIAGNOSIS — F323 Major depressive disorder, single episode, severe with psychotic features: Secondary | ICD-10-CM | POA: Diagnosis not present

## 2021-05-08 DIAGNOSIS — F333 Major depressive disorder, recurrent, severe with psychotic symptoms: Secondary | ICD-10-CM

## 2021-05-08 DIAGNOSIS — F410 Panic disorder [episodic paroxysmal anxiety] without agoraphobia: Secondary | ICD-10-CM | POA: Insufficient documentation

## 2021-05-08 NOTE — Progress Notes (Signed)
Virtual Visit via Video Note  I connected with Gail [name] on @TODAY @ at  9:00 AM EDT by a video enabled telemedicine application and verified that I am speaking with the correct person using two identifiers.  Location: Patient: at home Provider: at office   I discussed the limitations of evaluation and management by telemedicine and the availability of in person appointments. The patient expressed understanding and agreed to proceed.   I discussed the assessment and treatment plan with the patient. The patient was provided an opportunity to ask questions and all were answered. The patient agreed with the plan and demonstrated an understanding of the instructions.   The patient was advised to call back or seek an in-person evaluation if the symptoms worsen or if the condition fails to improve as anticipated.  I provided 20 minutes of non-face-to-face time during this encounter.   , M.Ed,CNA   Patient ID: Gail [name], female   DOB: [birth year], 20 y.o.   MRN: [id number] As per recent CCA states:  Gail [name] is a 20 year old patient who was [facility] the [facility] by her mother due to pt's ongoing anxiety and increased AVH. Pt shares she is currently enrolled and participating in the IOP program through [facility]; she states this is the second time she's participated in the program this year. Pt states she is currently on a leave from work due to it being short-staffed, which causes her panic attacks. Pt shares she's had incidents of constantly crying, not eating, and that it's begun to affect her relationship with her family.    Pt endorses SI yesterday; she shares she's been experiencing "constant imagery of stabbing myself here" (points to her neck). Pt had one incident of attempting to kill herself in 2019; she denies having a plan to kill herself at this time. Pt shares she's been experiencing compulsive thinking  about harming a peer at work and her sister, though she acknowledges she has no way in which to harm these people and she's not planned on doing this. Pt states she's been experiencing AVH and feels a "presence' behind her; she shares, "I don't want to do anything to myself or others." She endorses NSSIB via cutting, stating it's been going on "for some time now." Pt's mother states she owns a gun for protection and that she keeps it out at night since it is just she and pt in the home; pt's mother states that, due to pt's recent symptoms, she has been hiding the gun in the mornings. Pt stated, "I could find it," to which clinician replied, "yes, but what effect would that have on your relationship with your mother?" Pt identified that her looking for/touching her mother's gun would harm the trust they have with each other. Pt shares she vapes CBD oil approximately 2x/week.   Pt is oriented x5. Her recent/remote memory is intact. Pt was cooperative throughout the assessment process. Pt's insight, judgement, and impulse control is impaired at this time.  Pt was admitted at [facility] from 05-04-21 thru 05-07-21; transitioned to [facility]-IOP.  Pt declined PHP.  Pt denies any SI/HI or AVH. Pt will resume in all the virtual groups as scheduled.  A:  Reoriented pt.  Placed call to [facility] [phone] ext. [phone]) re: authorization; left clinicals on vm.  [id number] called back and stated she will push the date out until 05-17-21.  R:  Pt receptive.  10-08-1974, M.Ed,CNA

## 2021-05-08 NOTE — Progress Notes (Signed)
Virtual Visit via Video Note   I connected with Gail [name] on 05/08/21 at  9:00 AM EDT by a video enabled telemedicine application and verified that I am speaking with the correct person using two identifiers.   At orientation to the IOP program, Case Manager discussed the limitations of evaluation and management by telemedicine and the availability of in person appointments. The patient expressed understanding and agreed to proceed with virtual visits throughout the duration of the program.   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 Client stated needs and engaged in discussion.   Initial Therapeutic Activity: Counselor introduced [name], [name] Chaplain to present information and discussion on Grief and Loss. Group members engaged in discussion, sharing how grief impacts them, what comforts them, what emotions are felt, labeling losses, etc. Client noted grief connected with passing of grandfather and current life changes.   Second Therapeutic Activity: Counselor facilitated a check-in with group members to assess mood and current functioning. Client shared details of their mental health management since our last session, including challenges and successes. Client arrived within time allowed and reports that she is feeling "pretty good." Client rates her mood at a 7 on a scale of 1-10 with 10 being great. Counselor engaged group in discussion, covering the following topics: applying coping skills, identifying and celebrating growth, evaluating self in multiple facets of life, and HALT. Client presents with severe depression and severe anxiety. Client denied any current SI/HI.  Client reports sleep and appetite are good.    Third Therapeutic Activity: Counselor introduced [employer], [name] Chaplain to present information and discussion on [place]. Group members engaged in activity. Client shared hope related to future. 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      Check Out: Counselor closed program and prompted group members to identify one self-care practice or productivity activity they would like to engage in today. Client endorsed safety plan to be followed to prevent safety issues.   Assessment and Plan: Clinician recommends that Client remain in IOP treatment to better manage mental health symptoms, stabilization and to address treatment plan goals. Clinician recommends adherence to crisis/safety plan, taking medications as prescribed, and following up with medical professionals if any issues arise.     Follow Up Instructions: Clinician will send Webex link for next session. The Client was advised to call back or seek an in-person evaluation if the symptoms worsen or if the condition fails to improve as anticipated.     I provided 180 minutes of non-face-to-face time during this encounter.

## 2021-05-09 ENCOUNTER — Other Ambulatory Visit (HOSPITAL_COMMUNITY): Payer: 59 | Admitting: Professional

## 2021-05-09 ENCOUNTER — Other Ambulatory Visit: Payer: Self-pay

## 2021-05-09 DIAGNOSIS — F333 Major depressive disorder, recurrent, severe with psychotic symptoms: Secondary | ICD-10-CM

## 2021-05-09 DIAGNOSIS — F323 Major depressive disorder, single episode, severe with psychotic features: Secondary | ICD-10-CM | POA: Diagnosis not present

## 2021-05-09 DIAGNOSIS — F411 Generalized anxiety disorder: Secondary | ICD-10-CM

## 2021-05-10 ENCOUNTER — Other Ambulatory Visit: Payer: Self-pay

## 2021-05-10 ENCOUNTER — Other Ambulatory Visit (HOSPITAL_COMMUNITY): Payer: 59

## 2021-05-10 NOTE — Progress Notes (Signed)
Virtual Visit via Video Note   I connected with Gail [name] on 05/09/21 at  9:00 AM EDT by a video enabled telemedicine application and verified that I am speaking with the correct person using two identifiers.   At orientation to the IOP program, Case Manager discussed the limitations of evaluation and management by telemedicine and the availability of in person appointments. The patient expressed understanding and agreed to proceed with virtual visits throughout the duration of the program.   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 Client stated needs and engaged in discussion.   Initial Therapeutic Activity: Counselor introduced our guest speaker, Peggye Fothergill, [facility] Pharmacist, who shared about psychiatric medications, side effects, treatment considerations and how to communicate with medical professionals. Group Members asked questions and shared medication concerns.   Second Therapeutic Activity: Counselor facilitated a check-in with group members to assess mood and current functioning. Client shared details of their mental health management since our last session, including challenges and successes. Client arrived within time allowed and reports that she is feeling "pretty good." Client rates her mood at an 8 on a scale of 1-10 with 10 being great. Client presents with severe depression and severe anxiety. Client denied any current SI/HI. Client reports sleep and appetite are good.    Third Therapeutic Activity: Counselor introduced David Stall, [place] [facility] to present information and discussion on exercise and sleep. Group members engaged in activity.      Check  Out: Counselor closed program and prompted group members to identify one self-care practice or productivity activity they would like to engage in today. Client endorsed safety plan to be followed to prevent safety issues.   Assessment and Plan: Clinician recommends that Client remain in IOP treatment to better manage mental health symptoms, stabilization and to address treatment plan goals. Clinician recommends adherence to crisis/safety plan, taking medications as prescribed, and following up with medical professionals if any issues arise.     Follow Up Instructions: Clinician will send Webex link for next session. The Client was advised to call back or seek an in-person evaluation if the symptoms worsen or if the condition fails to improve as anticipated.     I provided 180 minutes of non-face-to-face time during this encounter.  Milana Na, LCMHCA, LCASA

## 2021-05-11 ENCOUNTER — Other Ambulatory Visit: Payer: Self-pay

## 2021-05-11 ENCOUNTER — Other Ambulatory Visit (HOSPITAL_COMMUNITY): Payer: 59 | Admitting: Professional

## 2021-05-11 DIAGNOSIS — F333 Major depressive disorder, recurrent, severe with psychotic symptoms: Secondary | ICD-10-CM

## 2021-05-11 DIAGNOSIS — F323 Major depressive disorder, single episode, severe with psychotic features: Secondary | ICD-10-CM | POA: Diagnosis not present

## 2021-05-11 DIAGNOSIS — F411 Generalized anxiety disorder: Secondary | ICD-10-CM

## 2021-05-14 ENCOUNTER — Other Ambulatory Visit: Payer: Self-pay

## 2021-05-14 ENCOUNTER — Other Ambulatory Visit (HOSPITAL_COMMUNITY): Payer: 59 | Admitting: Psychiatry

## 2021-05-14 DIAGNOSIS — F323 Major depressive disorder, single episode, severe with psychotic features: Secondary | ICD-10-CM | POA: Diagnosis not present

## 2021-05-14 DIAGNOSIS — F411 Generalized anxiety disorder: Secondary | ICD-10-CM

## 2021-05-14 DIAGNOSIS — F333 Major depressive disorder, recurrent, severe with psychotic symptoms: Secondary | ICD-10-CM

## 2021-05-14 NOTE — Progress Notes (Signed)
Virtual Visit via Video Note   I connected with Gail [name] on 05/11/21 at  9:00 AM EDT by a video enabled telemedicine application and verified that I am speaking with the correct person using two identifiers.   At orientation to the IOP program, Case Manager discussed the limitations of evaluation and management by telemedicine and the availability of in person appointments. The patient expressed understanding and agreed to proceed with virtual visits throughout the duration of the program.   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 Client stated needs and engaged in discussion.   Initial Counselor facilitated a check-in with group members to assess mood and current functioning. Client shared details of their mental health management since our last session, including challenges and successes. Client arrived within time allowed and reports that she is feeling "good." Client rates her mood at a 6 on a scale of 1-10 with 10 being great. Client presents with severe depression and severe anxiety. Client denied any current SI/HI. Client reports sleep and appetite are good. Pt reports she felt a panic attack coming on yesterday and was able to distract herself by making a brownie. Pt able to process.   Second Therapeutic Activity: Second Therapeutic Activity: Counselor facilitated group on distress tolerance skills. Group discussed "ACCEPTS" and "Self-Soothe" skills and how/when clients can employ these methods to help.  Clients identified when these techniques may be helpful in their personal lives. Client able to participate and process.      Check Out: Counselor closed program and  prompted group members to identify one self-care practice or productivity activity they would like to engage in today. Client endorsed safety plan to be followed to prevent safety issues.   Assessment and Plan: Clinician recommends that Client remain in IOP treatment to better manage mental health symptoms, stabilization and to address treatment plan goals. Clinician recommends adherence to crisis/safety plan, taking medications as prescribed, and following up with medical professionals if any issues arise.     Follow Up Instructions: Clinician will send Webex link for next session. The Client was advised to call back or seek an in-person evaluation if the symptoms worsen or if the condition fails to improve as anticipated.     I provided 180 minutes of non-face-to-face time during this encounter.  Milana Na, [facility], LCASA 05/11/21

## 2021-05-15 ENCOUNTER — Other Ambulatory Visit (HOSPITAL_COMMUNITY): Payer: 59

## 2021-05-15 ENCOUNTER — Other Ambulatory Visit: Payer: Self-pay

## 2021-05-16 ENCOUNTER — Other Ambulatory Visit (HOSPITAL_COMMUNITY): Payer: 59 | Admitting: Professional

## 2021-05-16 ENCOUNTER — Encounter (HOSPITAL_COMMUNITY): Payer: Self-pay

## 2021-05-16 ENCOUNTER — Other Ambulatory Visit: Payer: Self-pay

## 2021-05-16 DIAGNOSIS — F323 Major depressive disorder, single episode, severe with psychotic features: Secondary | ICD-10-CM | POA: Diagnosis not present

## 2021-05-16 DIAGNOSIS — F333 Major depressive disorder, recurrent, severe with psychotic symptoms: Secondary | ICD-10-CM

## 2021-05-16 NOTE — Progress Notes (Signed)
Virtual Visit via Video Note  I connected with Gail [name] on 05/14/21 at  9:00 AM EDT by a video enabled telemedicine application and verified that I am speaking with the correct person using two identifiers.   At orientation to the IOP program, Case Manager discussed the limitations of evaluation and management by telemedicine and the availability of in person appointments. The patient expressed understanding and agreed to proceed with virtual visits throughout the duration of the program.   Location:  Patient: Patient Home Provider: Home Office   History of Present Illness: MDD and GAD   Observations/Objective: Check In: Case Manager checked in with all participants to review discharge dates, insurance authorizations, work-related documents and needs from the treatment team regarding medications. Client stated needs and engaged in discussion.    Initial Therapeutic Activity: Counselor facilitated a check-in with group members to assess mood and current functioning. Client shared details of their mental health management since our last session, including challenges and successes. Counselor engaged group in discussion, covering the following topics: recreational activities for improving socialization and connections, community resources for various mental health support groups and programs, and top 3 life stressors and strategies for addressing issues. Client presents with moderate depression and moderate anxiety. Client denied any current SI/HI/psychosis.   Second Therapeutic Activity: Counselor prompted group members to compare and contrast their anxiety symptoms vs depressive symptoms. Counselor allowed time for reflection and for group to report out, discussing thoughts, triggers, physical symptoms, emotions and functioning. Counselor shared psychoeducation on the anxiety cycle and depressive cycle highlighting avoidance as a factor in increasing symptoms. Counselor shared a  video from [place], LMFT, Therapy in a Nutshell about addressing avoidance. Group members took note of takeaways and we discusses application skills.       Check Out: Counselor prompted group members to identify one self-care practice or productivity activity they would like to engage in today. Client endorsed safety plan to be followed to prevent safety issues.   Assessment and Plan: Clinician recommends that Client remain in IOP treatment to better manage mental health symptoms, stabilization and to address treatment plan goals. Clinician recommends adherence to crisis/safety plan, taking medications as prescribed, and following up with medical professionals if any issues arise.     Follow Up Instructions: Clinician will send Webex link for next session. The Client was advised to call back or seek an in-person evaluation if the symptoms worsen or if the condition fails to improve as anticipated.     I provided 180 minutes of non-face-to-face time during this encounter.     Hilbert Odor, LCSW

## 2021-05-17 ENCOUNTER — Other Ambulatory Visit: Payer: Self-pay

## 2021-05-17 ENCOUNTER — Other Ambulatory Visit (HOSPITAL_COMMUNITY): Payer: 59 | Admitting: Professional

## 2021-05-17 ENCOUNTER — Ambulatory Visit (INDEPENDENT_AMBULATORY_CARE_PROVIDER_SITE_OTHER): Payer: 59

## 2021-05-17 DIAGNOSIS — F333 Major depressive disorder, recurrent, severe with psychotic symptoms: Secondary | ICD-10-CM

## 2021-05-17 DIAGNOSIS — Z3042 Encounter for surveillance of injectable contraceptive: Secondary | ICD-10-CM | POA: Diagnosis not present

## 2021-05-17 DIAGNOSIS — F323 Major depressive disorder, single episode, severe with psychotic features: Secondary | ICD-10-CM | POA: Diagnosis not present

## 2021-05-17 MED ORDER — MEDROXYPROGESTERONE ACETATE 150 MG/ML IM SUSP
150.0000 mg | Freq: Once | INTRAMUSCULAR | Status: AC
  Administered 2021-05-17: 150 mg via INTRAMUSCULAR

## 2021-05-17 NOTE — Progress Notes (Signed)
Per orders of Alysia Penna, NP  injection of Depo-Provera given by Willaim Bane, CMA in the right upper outer quadrant of the buttocks.    Patient tolerated injection well. No signs or symptoms of a reaction were noted prior to patient leaving the nurse visit. Patient will make appointment for 3 months.

## 2021-05-18 ENCOUNTER — Other Ambulatory Visit (HOSPITAL_COMMUNITY): Payer: 59 | Admitting: Professional

## 2021-05-18 DIAGNOSIS — F323 Major depressive disorder, single episode, severe with psychotic features: Secondary | ICD-10-CM | POA: Diagnosis not present

## 2021-05-18 DIAGNOSIS — F333 Major depressive disorder, recurrent, severe with psychotic symptoms: Secondary | ICD-10-CM

## 2021-05-18 NOTE — Progress Notes (Signed)
Virtual Visit via Video Note  I connected with Gail [name] on 05/16/21 at  9:00 AM EDT by a video enabled telemedicine application and verified that I am speaking with the correct person using two identifiers.   At orientation to the IOP program, Case Manager discussed the limitations of evaluation and management by telemedicine and the availability of in person appointments. The patient expressed understanding and agreed to proceed with virtual visits throughout the duration of the program.   Location:  Patient: Patient Home Provider: Home Office   History of Present Illness: MDD and GAD   Observations/Objective: Check In: Case Manager checked in with all participants to review discharge dates, insurance authorizations, work-related documents and needs from the treatment team regarding medications. Client stated needs and engaged in discussion.     Initial Therapeutic Activity: Counselor facilitated a check-in with group members to assess mood and current functioning. Client shared details of their mental health management, including challenges and successes. Client arrived within time allowed and reports that she is feeling "OK." Client rates her mood at a 6 on a scale of 1-10 with 10 being great. Counselor engaged group in discussion, covering the following topics: rejection, stigma related to mental health, self-care, and dating while focusing on mental health journey. Client presents with depression and anxiety. Client denied any current SI/HI. Client reports sleep and appetite are good.    Second Therapeutic Activity: Counselor introduced Burnis Kingfisher, [facility]plain to present information and discussion on strength. Group members engaged in discussion, sharing how strength looks to them and where the definition of strength comes from.   Check Out: Counselor prompted group members to identify one self-care practice or productivity activity they would like to engage in  today. Client endorsed safety plan to be followed to prevent safety issues.   Assessment and Plan: Clinician recommends that Client remain in IOP treatment to better manage mental health symptoms, stabilization and to address treatment plan goals. Clinician recommends adherence to crisis/safety plan, taking medications as prescribed, and following up with medical professionals if any issues arise.     Follow Up Instructions: Clinician will send Webex link for next session. The Client was advised to call back or seek an in-person evaluation if the symptoms worsen or if the condition fails to improve as anticipated.     I provided 180 minutes of non-face-to-face time during this encounter.     Milana Na, LCMHCA, LCASA

## 2021-05-18 NOTE — Progress Notes (Signed)
Virtual Visit via Video Note  I connected with Gail [name] on 05/17/21 at  9:00 AM EDT by a video enabled telemedicine application and verified that I am speaking with the correct person using two identifiers.   At orientation to the IOP program, Case Manager discussed the limitations of evaluation and management by telemedicine and the availability of in person appointments. The patient expressed understanding and agreed to proceed with virtual visits throughout the duration of the program.   Location:  Patient: Patient Home Provider: Home Office   History of Present Illness: MDD and GAD   Observations/Objective: Check In: Case Manager checked in with all participants to review discharge dates, insurance authorizations, work-related documents and needs from the treatment team regarding medications. Client stated needs and engaged in discussion.     Initial Therapeutic Activity: Counselor facilitated a check-in with group members to assess mood and current functioning. Client shared details of their mental health management, including challenges and successes. Client arrived within time allowed and reports that she is feeling "OK." Client rates her mood at a 7 on a scale of 1-10 with 10 being great. Counselor engaged group in discussion, covering the following topics: codependence, being optimistic, and accepting opportunities as they arise. Client presents with depression and anxiety. Client denied any current SI/HI. Client reports sleep and appetite are good.  Second Therapeutic Activity: Counselor introduced the cognitive distortions. Group discussed common cognitive distortions and how to "catch, challenge, change". Group participate in an activity to identify cognitive distortions and which challenges to use.    Check Out: Counselor prompted group members to identify one self-care practice or productivity activity they would like to engage in today. Client endorsed safety  plan to be followed to prevent safety issues.   Assessment and Plan: Clinician recommends that Client remain in IOP treatment to better manage mental health symptoms, stabilization and to address treatment plan goals. Clinician recommends adherence to crisis/safety plan, taking medications as prescribed, and following up with medical professionals if any issues arise.     Follow Up Instructions: Clinician will send Webex link for next session. The Client was advised to call back or seek an in-person evaluation if the symptoms worsen or if the condition fails to improve as anticipated.     I provided 180 minutes of non-face-to-face time during this encounter.     Milana Na, LCMHCA, LCASA

## 2021-05-18 NOTE — Progress Notes (Signed)
Virtual Visit via Video Note  I connected with Gail [name] on 05/17/21 at  9:00 AM EDT by a video enabled telemedicine application and verified that I am speaking with the correct person using two identifiers.   At orientation to the IOP program, Case Manager discussed the limitations of evaluation and management by telemedicine and the availability of in person appointments. The patient expressed understanding and agreed to proceed with virtual visits throughout the duration of the program.   Location:  Patient: Patient Home Provider: Home Office   History of Present Illness: MDD and GAD   Observations/Objective: Check In: Case Manager checked in with all participants to review discharge dates, insurance authorizations, work-related documents and needs from the treatment team regarding medications. Client stated needs and engaged in discussion.     Initial Therapeutic Activity: Counselor facilitated a check-in with group members to assess mood and current functioning. Client shared details of their mental health management, including challenges and successes. Client arrived within time allowed and reports that she is feeling "OK." Client rates her mood at a 5 on a scale of 1-10 with 10 being great. Counselor engaged group in discussion, covering the following topics: codependence, feeling like a burden, and talking with boundaries. Client presents with depression and anxiety. Client denied any current SI/HI. Client reports sleep and appetite are good.  Second Therapeutic Activity: Counselor introduced the [employer]. Group discussed how thoughts, behaviors, and emotions related to a situation can affect how the situation is managed.   Third Therapeutic Activity: Counselor introduced Burnis Kingfisher, [facility]plain to present information and discussion on community and values. Group members engaged in discussion, sharing how strength looks to them and where the definition of  strength comes from.   Check Out: Counselor prompted group members to identify one self-care practice or productivity activity they would like to engage in today. Client endorsed safety plan to be followed to prevent safety issues.   Assessment and Plan: Clinician recommends that Client remain in IOP treatment to better manage mental health symptoms, stabilization and to address treatment plan goals. Clinician recommends adherence to crisis/safety plan, taking medications as prescribed, and following up with medical professionals if any issues arise.     Follow Up Instructions: Clinician will send Webex link for next session. The Client was advised to call back or seek an in-person evaluation if the symptoms worsen or if the condition fails to improve as anticipated.     I provided 180 minutes of non-face-to-face time during this encounter.     Gail [name], LCMHCA, LCASA

## 2021-05-21 ENCOUNTER — Other Ambulatory Visit (HOSPITAL_COMMUNITY): Payer: 59 | Attending: Psychiatry | Admitting: Licensed Clinical Social Worker

## 2021-05-21 ENCOUNTER — Other Ambulatory Visit: Payer: Self-pay

## 2021-05-21 DIAGNOSIS — Z5181 Encounter for therapeutic drug level monitoring: Secondary | ICD-10-CM | POA: Diagnosis not present

## 2021-05-21 DIAGNOSIS — F411 Generalized anxiety disorder: Secondary | ICD-10-CM | POA: Diagnosis not present

## 2021-05-21 DIAGNOSIS — F323 Major depressive disorder, single episode, severe with psychotic features: Secondary | ICD-10-CM | POA: Insufficient documentation

## 2021-05-21 DIAGNOSIS — Z79899 Other long term (current) drug therapy: Secondary | ICD-10-CM | POA: Diagnosis not present

## 2021-05-21 DIAGNOSIS — F333 Major depressive disorder, recurrent, severe with psychotic symptoms: Secondary | ICD-10-CM

## 2021-05-21 NOTE — Progress Notes (Signed)
Virtual Visit via Video Note   I connected with Gail Shape. [name] on 05/21/21 at  9:00 AM EDT by a video enabled telemedicine application and verified that I am speaking with the correct person using two identifiers.   At orientation to the IOP program, Case Manager discussed the limitations of evaluation and management by telemedicine and the availability of in person appointments. The patient expressed understanding and agreed to proceed with virtual visits throughout the duration of the program.   Location:  Patient: Patient Home Provider: [facility]   History of Present Illness: MDD and GAD   Observations/Objective: Check In: Case Manager checked in with all participants to review discharge dates, insurance authorizations, work-related documents and needs from the treatment team regarding medications. [name] stated needs and engaged in discussion.    Initial Therapeutic Activity: Counselor facilitated a check-in with [name] to assess for safety, sobriety and medication compliance.  Clinician also inquired about [name]'s current emotional ratings, as well as any significant changes in thoughts, feelings or behavior since previous check in.  [name] presented for session on time and was alert, oriented x5, with no evidence or self-report of SI/HI or A/V H.  [name] reported compliance with medication and denied use of alcohol or illicit substances.  [name] reported scores of 1/10 for depression, 9/10 for anxiety, and 4/10 for anger/irritability.  [name] denied any recent panic attacks or outbursts.  [name] denied any recent successes, reporting that she has felt sick since Friday with symptoms including nausea, stomach ache, and coughing.  Counselor encouraged [name] not to push herself, and could be excused from group if condition impacted ability to engage today.  [name] reported that she would continue with group if physically able and follow up with doctor if condition doesn't begin  to improve.     Second Therapeutic Activity: Counselor introduced topic of self-care today.  Counselor explained how this can be defined as the things one does to maintain good health and improve well-being.  Counselor provided members with a self-care assessment form to complete.  This handout featured various sub-categories of self-care, including physical, psychological/emotional, social, spiritual, and professional.  Members were asked to rank their engagement in the activities listed for each dimension on a scale of 1-3, with 1 indicating 'Poor', 2 indicating 'Ok', and 3 indicating 'Well'.  Counselor invited members to share results of their assessment, and inquired about which areas of self-care they are doing well in, as well as areas that require attention, and how they plan to begin addressing this during treatment.  Intervention was effective, as evidenced by [name] completing initial 2 sections of self-care assessment, reporting that she is excelling in areas such as taking care of personal hygiene, getting consistent rest/sleep, and participating in hobbies, but would benefit from eating healthier foods, eating regularly, and exercising more often.  [name] reported that she would plan to address self-care deficits by planning to do cardio exercise x1 per week for 20-30 minutes, be more open in group about challenges she is facing, and consider taking time off for mental and/or physical health when needed.      Assessment and Plan: Counselor recommends that [name] remain in IOP treatment to better manage mental health symptoms, ensure stability and pursue completion of treatment plan goals. Counselor recommends adherence to crisis/safety plan, taking medications as prescribed, and following up with medical professionals if any issues arise.     Follow Up Instructions: Counselor will send Webex link for next session. Brett was advised  to call back or seek an in-person evaluation if the symptoms  worsen or if the condition fails to improve as anticipated.     I provided 180 minutes of non-face-to-face time during this encounter.     [name], LCSW, LCAS 05/21/21

## 2021-05-22 ENCOUNTER — Other Ambulatory Visit (HOSPITAL_COMMUNITY): Payer: 59 | Admitting: Licensed Clinical Social Worker

## 2021-05-22 ENCOUNTER — Other Ambulatory Visit: Payer: Self-pay

## 2021-05-22 DIAGNOSIS — F323 Major depressive disorder, single episode, severe with psychotic features: Secondary | ICD-10-CM | POA: Diagnosis not present

## 2021-05-22 DIAGNOSIS — F333 Major depressive disorder, recurrent, severe with psychotic symptoms: Secondary | ICD-10-CM

## 2021-05-22 DIAGNOSIS — F411 Generalized anxiety disorder: Secondary | ICD-10-CM

## 2021-05-22 NOTE — Progress Notes (Signed)
Virtual Visit via Video Note   I connected with Gail [name] on 05/22/21 at  9:00 AM EDT by a video enabled telemedicine application and verified that I am speaking with the correct person using two identifiers.   At orientation to the IOP program, Case Manager discussed the limitations of evaluation and management by telemedicine and the availability of in person appointments. The patient expressed understanding and agreed to proceed with virtual visits throughout the duration of the program.   Location:  Patient: Patient Home Provider: [facility]   History of Present Illness: MDD and GAD   Observations/Objective: Check In: Case Manager checked in with all participants to review discharge dates, insurance authorizations, work-related documents and needs from the treatment team regarding medications. Gail [name] stated needs and engaged in discussion.    Initial Therapeutic Activity: Counselor facilitated a check-in with Gail [name] to assess for safety, sobriety and medication compliance.  Clinician also inquired about Gail [name]'s current emotional ratings, as well as any significant changes in thoughts, feelings or behavior since previous check in.  Gail [name] presented for session on time and was alert, oriented x5, with no evidence or self-report of SI/HI or A/V H.  Gail [name] reported compliance with medication and denied use of alcohol or illicit substances.  Gail [name] reported scores of 0/10 for depression, 3/10 for anxiety, and 0/10 for anger/irritability.  Gail [name] denied any recent panic attacks or outbursts.  Gail [name] reported that she continues to struggle with feeling ill, although she believes symptoms are improving.  Gail [name] reported that she plans to rest after group today, but remains agreeable to visiting urgent care if condition doesn't show significant improvement by tomorrow.     Second Therapeutic Activity: Counselor introduced [name], [name] Chaplain to provide psychoeducation  on topic of Grief and Loss with members today.  Gail [name] began discussion by checking in with the group about their baseline mood today, general thoughts on what grief means to them and how it has affected them personally in the past.  Gail [name] provided information on how the process of grief/loss can differ depending upon one's unique culture, and categories of loss one could experience (i.e. loss of a person, animal, relationship, job, identity, etc).  Gail [name] encouraged members to be mindful of how pervasive loss can be, and how to recognize signs which could indicate that this is having an impact on one's overall mental health and wellbeing.  Intervention effectiveness could not be measured, as Gail [name] did not participate in this group discussion.      Third Therapeutic Activity: Counselor covered topic of core beliefs with group today.  Counselor virtually shared a handout on the subject, which explained how everyone looks at the world differently, and two people can have the same experience, but have different interpretations of what happened.  Members were encouraged to think of these like sunglasses with different "shades" influencing perception towards positive or negative outcomes.  Examples of negative core beliefs were provided, such as "I'm unlovable", "I'm not good enough", and "I'm a bad person".  Members were asked to share which one(s) they could relate to, and then identify evidence which contradicts these beliefs.  Counselor also provided psychoeducation on positive affirmations today.  Counselor explained how these are positive statements which can be spoken out loud or recited mentally to challenge negative thoughts and/or core beliefs to improve mood and outlook each day.  Counselor shared a comprehensive list of affirmations virtually to members with different categories, including ones for health, confidence, success, and  happiness.  Counselor invited members to look through this list and  identify any which resonated with them, and practice saying them out loud with sincerity.  Intervention effectiveness could not be measured, as Gail [name] did not participate in this group discussion either.  Counselor informed case Production designer, theatre/television/film of Gail [name]'s lack of participation today.         Assessment and Plan: Counselor recommends that Gail [name] remain in IOP treatment to better manage mental health symptoms, ensure stability and pursue completion of treatment plan goals. Counselor recommends adherence to crisis/safety plan, taking medications as prescribed, and following up with medical professionals if any issues arise.     Follow Up Instructions: Counselor will send Webex link for next session. Gail [name] was advised to call back or seek an in-person evaluation if the symptoms worsen or if the condition fails to improve as anticipated.     I provided 180 minutes of non-face-to-face time during this encounter.     Gail [name] Stain, LCSW, LCAS 05/22/21

## 2021-05-23 ENCOUNTER — Ambulatory Visit
Admission: EM | Admit: 2021-05-23 | Discharge: 2021-05-23 | Disposition: A | Discharge status: Home / Self Care | Payer: 59 | Attending: Urgent Care | Admitting: Urgent Care

## 2021-05-23 ENCOUNTER — Other Ambulatory Visit (HOSPITAL_COMMUNITY): Payer: 59

## 2021-05-23 ENCOUNTER — Other Ambulatory Visit: Payer: Self-pay

## 2021-05-23 DIAGNOSIS — F339 Major depressive disorder, recurrent, unspecified: Secondary | ICD-10-CM

## 2021-05-23 DIAGNOSIS — R197 Diarrhea, unspecified: Secondary | ICD-10-CM | POA: Diagnosis not present

## 2021-05-23 DIAGNOSIS — R112 Nausea with vomiting, unspecified: Secondary | ICD-10-CM

## 2021-05-23 DIAGNOSIS — R5383 Other fatigue: Secondary | ICD-10-CM

## 2021-05-23 MED ORDER — ONDANSETRON 8 MG PO TBDP: 8.0000 mg | ORAL_TABLET | Freq: Three times a day (TID) | ORAL | 0 refills | Status: DC | PRN

## 2021-05-23 MED ORDER — LOPERAMIDE HCL 2 MG PO CAPS: 2.0000 mg | ORAL_CAPSULE | Freq: Two times a day (BID) | ORAL | 0 refills | Status: DC | PRN

## 2021-05-23 MED ORDER — ONDANSETRON HCL 4 MG/2ML IJ SOLN
4.0000 mg | Freq: Once | INTRAMUSCULAR | Status: AC
  Administered 2021-05-23: 4 mg via INTRAMUSCULAR

## 2021-05-23 NOTE — ED Triage Notes (Signed)
Pt c/o abdominal pain, emesis, fatigue, diarrhea onset last week. Denies fever.

## 2021-05-23 NOTE — ED Provider Notes (Signed)
[facility]   MRN: [id number] DOB: 04-Jan-2001  Subjective:   Gail [name] is a 20 y.o. female presenting for 1 week history of persistent nausea, vomiting, diarrhea, abdominal pain.  Denies fever, runny or stuffy nose, sore throat, cough, chest pain, shortness of breath, hematemesis, bloody stools, urinary symptoms.  Patient is not sexually active, states that she had one this past month and was negative.  Patient has a history of depression with psychotic features, anxiety disorder and reported history of schizophrenia.  She was started on Zoloft 04/25/2021 with Seroquel.  This was for recurrent worsening depression.  She has been following up very closely with intensive outpatient programming.  No drug use, alcohol use.  No smoking.  Patient has follow-up tomorrow.  No history of gastrointestinal disorders.  No recent long distance travel outside the country, recent antibiotic use, recent hospitalizations.  No current facility-administered medications for this encounter.  Current Outpatient Medications:    QUEtiapine (SEROQUEL) 25 MG tablet, Take 1 tablet (25 mg total) by mouth at bedtime for 7 days., Disp: 7 tablet, Rfl: 0   sertraline (ZOLOFT) 50 MG tablet, Take 1 tablet (50 mg total) by mouth daily., Disp: 7 tablet, Rfl: 0   No Known Allergies  Past Medical History:  Diagnosis Date   Anxiety    Depression    Medical history non-contributory    Vision abnormalities      Past Surgical History:  Procedure Laterality Date   WISDOM TOOTH EXTRACTION      Family History  Problem Relation Age of Onset   Rheum arthritis Mother    Lupus Mother    Obesity Mother    Hypertension Mother    Kidney disease Mother    Healthy Sister    Breast cancer Maternal Grandmother    Cancer Maternal Grandmother        breast cancer   Diabetes Maternal Grandmother    Hypertension Maternal Grandmother    Hyperlipidemia Maternal Grandmother    Breast cancer Cousin     Diabetes Father     Social History   Tobacco Use   Smoking status: Never   Smokeless tobacco: Never  Vaping Use   Vaping Use: Never used  Substance Use Topics   Alcohol use: Yes    Comment: Rare   Drug use: Yes    Frequency: 3.0 times per week    Types: Marijuana    ROS   Objective:   Vitals: BP 111/75 (BP Location: Left Arm)   Pulse 93   Temp 97.9 F (36.6 C) (Oral)   Resp 14   SpO2 94%   Physical Exam Constitutional:      General: She is not in acute distress.    Appearance: Normal appearance. She is well-developed. She is not ill-appearing, toxic-appearing or diaphoretic.  HENT:     Head: Normocephalic and atraumatic.     Right Ear: Tympanic membrane, ear canal and external ear normal. No drainage or tenderness. No middle ear effusion. Tympanic membrane is not erythematous.     Left Ear: Tympanic membrane, ear canal and external ear normal. No drainage or tenderness.  No middle ear effusion. Tympanic membrane is not erythematous.     Nose: Nose normal. No congestion or rhinorrhea.     Mouth/Throat:     Mouth: Mucous membranes are moist. No oral lesions.     Pharynx: No pharyngeal swelling, oropharyngeal exudate, posterior oropharyngeal erythema or uvula swelling.     Tonsils: No tonsillar exudate or tonsillar abscesses.  Eyes:     General: No scleral icterus.       Right eye: No discharge.        Left eye: No discharge.     Extraocular Movements: Extraocular movements intact.     Right eye: Normal extraocular motion.     Left eye: Normal extraocular motion.     Conjunctiva/sclera: Conjunctivae normal.     Pupils: Pupils are equal, round, and reactive to light.  Cardiovascular:     Rate and Rhythm: Normal rate and regular rhythm.     Pulses: Normal pulses.     Heart sounds: Normal heart sounds. No murmur heard.   No friction rub. No gallop.  Pulmonary:     Effort: Pulmonary effort is normal. No respiratory distress.     Breath sounds: Normal breath  sounds. No stridor. No wheezing, rhonchi or rales.  Chest:     Chest wall: No tenderness.  Abdominal:     General: Bowel sounds are normal. There is no distension.     Palpations: Abdomen is soft. There is no mass.     Tenderness: There is abdominal tenderness. There is no right CVA tenderness, left CVA tenderness, guarding or rebound.  Musculoskeletal:     Cervical back: Normal range of motion and neck supple.  Lymphadenopathy:     Cervical: No cervical adenopathy.  Skin:    General: Skin is warm and dry.     Findings: No rash.  Neurological:     General: No focal deficit present.     Mental Status: She is alert and oriented to person, place, and time.  Psychiatric:        Mood and Affect: Mood normal.        Behavior: Behavior normal.        Thought Content: Thought content normal.        Judgment: Judgment normal.     Assessment and Plan :   PDMP not reviewed this encounter.  1. Nausea vomiting and diarrhea   2. Other fatigue   3. Episode of recurrent major depressive disorder, unspecified depression episode severity (HCC)     Suspect that this is an adverse effect as patient is now taking 50 mg of Zoloft as opposed to her initial 25 mg.  I do not suspect an infectious process.  Recommended supportive care, IM Zofran given in clinic.  Use p.o. Zofran at home with or without loperamide.  Patient has very close follow-up with IOP.  Recommend they consider whether or not this is a possible side effect of her medication.  Patient does not have red flags of fever, hematochezia or hematemesis.  Counseled patient on potential for adverse effects with medications prescribed/recommended today, ER and return-to-clinic precautions discussed, patient verbalized understanding.    Wallis Bamberg, [place] 05/23/21 1208

## 2021-05-24 ENCOUNTER — Other Ambulatory Visit (HOSPITAL_COMMUNITY): Payer: 59 | Admitting: Psychiatry

## 2021-05-24 ENCOUNTER — Telehealth (HOSPITAL_COMMUNITY): Payer: Self-pay | Admitting: Psychiatry

## 2021-05-25 ENCOUNTER — Other Ambulatory Visit: Payer: Self-pay

## 2021-05-25 ENCOUNTER — Other Ambulatory Visit (HOSPITAL_COMMUNITY): Payer: 59 | Admitting: Licensed Clinical Social Worker

## 2021-05-25 DIAGNOSIS — F411 Generalized anxiety disorder: Secondary | ICD-10-CM

## 2021-05-25 DIAGNOSIS — F323 Major depressive disorder, single episode, severe with psychotic features: Secondary | ICD-10-CM | POA: Diagnosis not present

## 2021-05-25 DIAGNOSIS — F333 Major depressive disorder, recurrent, severe with psychotic symptoms: Secondary | ICD-10-CM

## 2021-05-25 NOTE — Progress Notes (Signed)
Virtual Visit via Video Note   I connected with Donald Prose. [name] on 05/25/21 at  9:00 AM EDT by a video enabled telemedicine application and verified that I am speaking with the correct person using two identifiers.   At orientation to the IOP program, Case Manager discussed the limitations of evaluation and management by telemedicine and the availability of in person appointments. The patient expressed understanding and agreed to proceed with virtual visits throughout the duration of the program.   Location:  Patient: Patient Home Provider: [facility]   History of Present Illness: MDD and GAD   Observations/Objective: Check In: Case Manager checked in with all participants to review discharge dates, insurance authorizations, work-related documents and needs from the treatment team regarding medications. Nurah stated needs and engaged in discussion.    Initial Therapeutic Activity: Counselor facilitated a check-in with Eldonna to assess for safety, sobriety and medication compliance.  Clinician also inquired about Kiylee's current emotional ratings, as well as any significant changes in thoughts, feelings or behavior since previous check in.  Adaline presented for session on time and was alert, oriented x5, with no evidence or self-report of SI/HI or A/V H.  Kiari reported compliance with medication and denied use of alcohol or illicit substances.  Erine reported scores of 0/10 for depression, 2/10 for anxiety, and 0/10 for anger/irritability.  Eloni denied any recent panic attacks or outbursts.  Jasara reported that one success today is she is feeling better physically since going to urgent care, although one of the doctors she met with warned her that a medication she is on could be the cause of her recent symptoms.  Counselor relayed this information to case Midwife.  Deloris reported that she has an appointment with school advisor from 10:30am until 11am, so  case manager excused her from remainder of group after break.     Second Therapeutic Activity: Counselor introduced topic of self-esteem today and defined this as the value an individual places on oneself, based upon assessment of personal worth as a human being and approval/disapproval of one's behavior. Counselor asked members to assess their level of self-esteem at this time based upon common indicators of high self-esteem, including: accepting oneself unconditionally;  having self-respect and deep seated belief that one matters; being unaffected by other people's opinions/criticisms; and showing good control over emotions.  Counselor also explained concept of one's inner critic which serves to highlight faults and minimize strengths, directly influencing low sense of self-esteem.  Counselor then provided handout on 'strengths and qualities', which featured questions to guide discussion and increase awareness of each member's unique individual abilities which could reinforce higher self-esteem. Examples of questions included: 'things I am good at', 'challenges I have overcome', and 'what I like about myself'.  Intervention was effective, as evidenced by [name] actively participating in discussion on the subject and related activity.  Ashauna reported initial rating of self-esteem at 5/10 today, and was able to identify several personal strengths such as possessing artistic ability, kindness, and gratitude, in addition to setting goals to improve in areas affecting low self-esteem, such as becoming a better leader, learning to forgive, and practice self-control.  Channah was not able to engage fully in activity due to leaving session for appointment at 10:30am.          Third Therapeutic Activity: Counselor concluded group by offering members the opportunity to engage in a voluntary guided imagery exercise.  Counselor informed members beforehand that they could discontinue activity  at any time if they began to  experience any negative emotional reaction.  Counselor guided members through process of getting comfortable, achieving relaxed breathing pattern, and then narrated 10 minute visualization of walking across a beach setting, including various pleasant sensory details to enhance experience (I.e. sand under foot, smell of salt in air, sound of crashing waves, etc).  Intervention effectiveness could not be measured, as Dalores left for appointment after 10:30am.     Assessment and Plan: Counselor recommends that [place] remain in IOP treatment to better manage mental health symptoms, ensure stability and pursue completion of treatment plan goals. Counselor recommends adherence to crisis/safety plan, taking medications as prescribed, and following up with medical professionals if any issues arise.     Follow Up Instructions: Counselor will send Webex link for next session. Bevin was advised to call back or seek an in-person evaluation if the symptoms worsen or if the condition fails to improve as anticipated.     I provided 120 minutes of non-face-to-face time during this encounter.     Shade Flood, LCSW, LCAS 05/25/21

## 2021-05-28 ENCOUNTER — Other Ambulatory Visit (HOSPITAL_COMMUNITY): Payer: 59 | Admitting: Licensed Clinical Social Worker

## 2021-05-28 ENCOUNTER — Other Ambulatory Visit: Payer: Self-pay

## 2021-05-28 DIAGNOSIS — F333 Major depressive disorder, recurrent, severe with psychotic symptoms: Secondary | ICD-10-CM

## 2021-05-28 DIAGNOSIS — F411 Generalized anxiety disorder: Secondary | ICD-10-CM

## 2021-05-28 DIAGNOSIS — F323 Major depressive disorder, single episode, severe with psychotic features: Secondary | ICD-10-CM | POA: Diagnosis not present

## 2021-05-28 NOTE — Progress Notes (Signed)
Virtual Visit via Video Note   I connected with Gail Shape. [name] on 05/28/21 at  9:00 AM EDT by a video enabled telemedicine application and verified that I am speaking with the correct person using two identifiers.   At orientation to the IOP program, Case Manager discussed the limitations of evaluation and management by telemedicine and the availability of in person appointments. The patient expressed understanding and agreed to proceed with virtual visits throughout the duration of the program.   Location:  Patient: Patient Home Provider: [facility]   History of Present Illness: MDD and GAD   Observations/Objective: Check In: Case Manager checked in with all participants to review discharge dates, insurance authorizations, work-related documents and needs from the treatment team regarding medications. Wava stated needs and engaged in discussion.    Initial Therapeutic Activity: Counselor facilitated a check-in with Weston to assess for safety, sobriety and medication compliance.  Clinician also inquired about Emanuela's current emotional ratings, as well as any significant changes in thoughts, feelings or behavior since previous check in.  Meghan presented for session on time and was alert, oriented x5, with no evidence or self-report of SI/HI or A/V H.  Carsynn reported compliance with medication and denied use of alcohol or illicit substances.  Magenta reported scores of 0/10 for depression, 3/10 for anxiety, and 0/10 for anger/irritability.  Orie denied any recent panic attacks or outbursts.  Amore denied any recent successes.  Lilygrace reported that her present struggle is focusing on her homework, stating "I think its because of my anxiety".     Second Therapeutic Activity: Counselor introduced topic of building social support network today.  Counselor explained how this can be defined as having a having a group of healthy people in one's life you can talk to, spend time with,  and get help from to improve both mental and physical health.  Counselor noted that some barriers can make it difficult to connect with other people, including the presence of anxiety or depression, or moving to an unfamiliar area.  Group members were asked to assess the current state of their support network, and identify ways that this could be improved.  Tips were given on how to address previously noted barriers, such as strengthening social skills, using relaxation techniques to reduce anxiety, scheduling social time each week, and/or exploring social events nearby which could increase chances of meeting new supports.  Members were also encouraged to consider getting closer to people they already know through suggestions such as outreaching someone by text, email or phone call if they haven't spoken in awhile, doing something nice for a friend/family member unexpectedly, and/or inviting someone over for a game/movie/dinner night.  Intervention was effective, as evidenced by [name] actively participating in discussion on the subject and reporting that although she feels like her support system is positive and has helped her with recent mental health related issues, she believes it could be improved by meeting more people her age with common interests.  Aaima reported that she would accomplish this goal by participating in social groups at her school such as a volleyball club, since this would offer socialization and physical activity.     Assessment and Plan: Counselor recommends that Alanny remain in IOP treatment to better manage mental health symptoms, ensure stability and pursue completion of treatment plan goals. Counselor recommends adherence to crisis/safety plan, taking medications as prescribed, and following up with medical professionals if any issues arise.     Follow Up Instructions: Counselor  will send Webex link for next session. Robbin was advised to call back or seek an in-person  evaluation if the symptoms worsen or if the condition fails to improve as anticipated.     I provided 180 minutes of non-face-to-face time during this encounter.     [name], LCSW, LCAS 05/28/21

## 2021-05-29 ENCOUNTER — Other Ambulatory Visit: Payer: Self-pay

## 2021-05-29 ENCOUNTER — Other Ambulatory Visit (HOSPITAL_COMMUNITY): Payer: 59 | Admitting: Licensed Clinical Social Worker

## 2021-05-29 DIAGNOSIS — F323 Major depressive disorder, single episode, severe with psychotic features: Secondary | ICD-10-CM | POA: Diagnosis not present

## 2021-05-29 DIAGNOSIS — F333 Major depressive disorder, recurrent, severe with psychotic symptoms: Secondary | ICD-10-CM

## 2021-05-29 DIAGNOSIS — F411 Generalized anxiety disorder: Secondary | ICD-10-CM

## 2021-05-29 NOTE — Progress Notes (Signed)
Virtual Visit via Video Note   I connected with Gail [name]. [name] on 05/29/21 at  9:00 AM EDT by a video enabled telemedicine application and verified that I am speaking with the correct person using two identifiers.   At orientation to the IOP program, Case Manager discussed the limitations of evaluation and management by telemedicine and the availability of in person appointments. The patient expressed understanding and agreed to proceed with virtual visits throughout the duration of the program.   Location:  Patient: Patient Home Provider: [facility]   History of Present Illness: MDD and GAD   Observations/Objective: Check In: Case Manager checked in with all participants to review discharge dates, insurance authorizations, work-related documents and needs from the treatment team regarding medications. Gail [name] stated needs and engaged in discussion.    Initial Therapeutic Activity: Counselor facilitated a check-in with Gail [name] to assess for safety, sobriety and medication compliance.  Clinician also inquired about Gail [name]'s current emotional ratings, as well as any significant changes in thoughts, feelings or behavior since previous check in.  [name] presented for session on time and was alert, oriented x5, with no evidence or self-report of SI/HI or A/V H.  Gail [name] reported compliance with medication and denied use of alcohol or illicit substances.  Gail [name] reported scores of 4/10 for depression, 8/10 for anxiety, and 0/10 for anger/irritability.  Gail [name] denied any recent panic attacks or outbursts.  Gail [name] reported that her present struggle is dealing with anxiety related to academic troubles and her future career, but she plans to discuss this concern with an advisor through the school.  Gail [name] reported that she will set a goal to spend more time engaging in self-care activities such as making art to ensure outlet for ongoing academic stress.     Second Therapeutic Activity:  Counselor introduced [name], [name] Chaplain to provide psychoeducation on topic of Grief and Loss with members today.  Gail [name] began discussion by checking in with the group about their baseline mood today, general thoughts on what grief means to them and how it has affected them personally in the past.  Gail [name] provided information on how the process of grief/loss can differ depending upon one's unique culture, and categories of loss one could experience (i.e. loss of a person, animal, relationship, job, identity, etc).  Gail [name] encouraged members to be mindful of how pervasive loss can be, and how to recognize signs which could indicate that this is having an impact on one's overall mental health and wellbeing.  Intervention was effective, as evidenced by Gail [name] engaging in conversation with speaker on the subject regarding loss of confidence in ability to focus on academic studies due to present depression.    Third Therapeutic Activity: Counselor introduced topic of creating mental health maintenance plan today.  Counselor provided handout on subject to members, which stressed the importance of maintaining one's mental health in a similar way to using diet and exercise to ensure physical health.  Counselor walked members through process of identifying triggers which could worsen symptoms, including specific people, places, and things one needs to avoid.  Members were also tasked with identifying warning signs such as thoughts, feelings, or behaviors which could indicate mental health is at increased risk.  Counselor also facilitated conversation on self-care activities and coping strategies which members have previously utilized in the past, are currently using in daily routine, or plan to use soon to assist with managing problems or symptoms when/if they appear.  Counselor encouraged members to revisit their  maintenance plan often and make changes as needed to ensure day to day stability.   Intervention was effective, as evidenced by Gail [name] successfully completing mental health maintenance plan, which included identifying mental health risks such as engaging in negative self-talk, sleeping more than normal, and experiencing excessive stress, in addition to preventative steps she can take to counter these issues, such as setting boundaries with negative people that influence her mood, listening to uplifting music, or participating in fun social activities to meet new friends.  Gail [name] reported that she would seek readmission to [facility] in the future if she began to notice that she was spending less time on hobbies due to lack of motivation.     Assessment and Plan: Counselor recommends that Gail [name] remain in IOP treatment to better manage mental health symptoms, ensure stability and pursue completion of treatment plan goals. Counselor recommends adherence to crisis/safety plan, taking medications as prescribed, and following up with medical professionals if any issues arise.     Follow Up Instructions: Counselor will send Webex link for next session. Gail [name] was advised to call back or seek an in-person evaluation if the symptoms worsen or if the condition fails to improve as anticipated.     I provided 180 minutes of non-face-to-face time during this encounter.     [name], LCSW, LCAS 05/29/21

## 2021-05-30 ENCOUNTER — Encounter (HOSPITAL_COMMUNITY): Payer: Self-pay | Admitting: Family

## 2021-05-30 ENCOUNTER — Other Ambulatory Visit (HOSPITAL_COMMUNITY): Payer: 59 | Admitting: Psychiatry

## 2021-05-30 ENCOUNTER — Other Ambulatory Visit: Payer: Self-pay

## 2021-05-30 DIAGNOSIS — F333 Major depressive disorder, recurrent, severe with psychotic symptoms: Secondary | ICD-10-CM

## 2021-05-30 DIAGNOSIS — F323 Major depressive disorder, single episode, severe with psychotic features: Secondary | ICD-10-CM | POA: Diagnosis not present

## 2021-05-30 DIAGNOSIS — F331 Major depressive disorder, recurrent, moderate: Secondary | ICD-10-CM

## 2021-05-30 NOTE — Progress Notes (Signed)
Virtual Visit via Video Note   I connected with Gail [name] on 05/30/21 at  9:00 AM EDT by a video enabled telemedicine application and verified that I am speaking with the correct person using two identifiers.   At orientation to the IOP program, Case Manager discussed the limitations of evaluation and management by telemedicine and the availability of in person appointments. The patient expressed understanding and agreed to proceed with virtual visits throughout the duration of the program.   Location:  Patient: Patient Home Provider: [facility]   History of Present Illness: MDD and GAD   Observations/Objective: Check In: Case Manager checked in with all participants to review discharge dates, insurance authorizations, work-related documents and needs from the treatment team regarding medications. Gail [name] stated needs and engaged in discussion.    Initial Therapeutic Activity: Counselor facilitated a check-in with Gail [name] to assess for safety, sobriety and medication compliance.  Clinician also inquired about Gail [name]'s current emotional ratings, as well as any significant changes in thoughts, feelings or behavior since previous check in.  Gail [name] presented for session on time and was alert, oriented x5, with no evidence or self-report of SI/HI or A/V H.  Gail [name] reported compliance with medication and denied use of alcohol or illicit substances.  Gail [name] reported scores of 0/10 for depression, 3/10 for anxiety, and 0/10 for anger/irritability.  Gail [name] denied any recent panic attacks or outbursts.  Gail [name] denied any struggles.  Gail [name] reported that a recent success was getting in contact with [facility] to continue outpatient therapy after [facility].     Second Therapeutic Activity: Clinician continued discussion upon topic of self-care today with group members.  Clinician virtually shared self-care assessment form with members via Webex to complete final sub-categories (i.e.  social, spiritual, and professional).  Members were asked to rank their engagement in the activities listed for each dimension on a scale of 1-3, with 1 indicating 'Poor', 2 indicating 'Ok', and 3 indicating 'Well'.  Clinician invited members to share results of this assessment, and inquired about which areas of self-care they are doing well in, as well as areas that require attention, and how they plan to begin addressing this during treatment.  Intervention was effective, as evidenced by Gail [name] successfully completing final sections of assessment and actively engaging in discussion on subject, reporting that she is excelling in areas such as asking others for help when needed, acting in accordance with morals and values, and appreciating impactful art, but would benefit from focusing more on areas such as keeping in touch with old friends, outreaching those who live far away, meeting new people, setting aside time for thought and reflection, and praying or meditating.  Gail [name] reported that she would work to improve self-care deficits by outreaching old friends on social media she has lost touch with, take advantage of social events or clubs at school to meet new peers, and include more time in self-care routine to reflect on life direction through meditation or prayer.   Third Therapeutic Activity: Clinician offered to teach group members an ACT relaxation technique today to aid in managing difficult thoughts, feelings, urges, and sensations.  Clinician guided members through process of getting comfortable, achieving relaxing breathing rhythm, and then maintaining this throughout activity.  Clinician invited members to imagine a gently flowing stream in their mind with leaves floating upon it, and when any thoughts, feelings, urges, or sensations arose, good or bad, they were instructed to visualize placing them on these passing leaves over course of 10  minutes practice.  Intervention was effective, as evidenced  by Gail [name] successfully participating in activity and reporting that she enjoyed it and found herself more relaxed as a result.     Assessment and Plan: Gail [name] informed case Production designer, theatre/television/film and counselor that she is ready to end group treatment and discharged successfully following todays session.  Gail [name] has been recommended to continue making regular appointments with her primary counselor, psychiatrist, and medical providers following this transition to ensure consistent monitoring of mental health symptoms, adherence to crisis/safety plan, and medication compliance.     Follow Up Instructions: Gail [name] was advised to call back or seek an in-person evaluation if the symptoms worsen or if the condition fails to improve as anticipated.     I provided 180 minutes of non-face-to-face time during this encounter.     [name], LCSW, LCAS 05/30/21

## 2021-05-30 NOTE — Patient Instructions (Signed)
D:  Patient completed [facility] today.  A:  Discharged today.  Follow up with [facility] on 06-05-21 for medication mgmt.  According to pt, someone will be calling with a therapist appt from there.  Encouraged support groups.  R:  Patient receptive.

## 2021-05-30 NOTE — Progress Notes (Signed)
Virtual Visit via Video Note  I connected with Gail [name] on @TODAY @ at  9:00 AM EDT by a video enabled telemedicine application and verified that I am speaking with the correct person using two identifiers.  Location: Patient: at home Provider: at office   I discussed the limitations of evaluation and management by telemedicine and the availability of in person appointments. The patient expressed understanding and agreed to proceed.  I discussed the assessment and treatment plan with the patient. The patient was provided an opportunity to ask questions and all were answered. The patient agreed with the plan and demonstrated an understanding of the instructions.   The patient was advised to call back or seek an in-person evaluation if the symptoms worsen or if the condition fails to improve as anticipated.  I provided 20 minutes of non-face-to-face time during this encounter.   , M.Ed,CNA   Patient ID: Gail [name], female   DOB: 2001/02/22, 19 y.o.   MRN: [id number] Most recent note from [facility] on 11-02-20:  This is a 20 yr old, single female who was referred per [facility], [facility]; treatment for worsening depressive and anxiety symptoms.  Pt denies SI/HI and V/Hallucinations.  Pt admits to A/Hallucinations.  Pt reports being diagnosed with "Schizophrenia" when she was in [facility] (2019).  Pt states her sx's started to worsen a couple of months ago.  "I stress over everything."  Pt has been struggling with anxiety thus making the A/Hallucinations worsen at times.  "I hear someone calling my name, but I know that there's no one calling me and that it's in my head."  Pt admits to previously taking various meds but states they always made her feel worse.  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  States that the amount of courses she is taking and the amount of time she spends on each class is very taxing.  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  pt works Wed-Sun (9pm-8 am.).  Pt states her mother, sister and friends are her support system.  Pt attended all scheduled days.  Reports feeling better than whenever she started.  Denies SI/HI or A/V hallucinations.  On a scale of 1-10 (10 being the worst); pt rates her anxiety at a 2 and depression at a 0.  Reports that the groups were helpful.  "They gave me a lot of advise." A:  D/C today.  F/U with [facility] on 06-05-21 for medication mgmt.  Pt states she will be receiving a call re: therapy.  Strongly encouraged support groups.  R:  Pt receptive.  06-07-21, M.Ed,CNA

## 2021-05-30 NOTE — Progress Notes (Signed)
Virtual Visit via Video Note  I connected with [name] on 05/30/21 at  9:00 AM EDT by a video enabled telemedicine application and verified that I am speaking with the correct person using two identifiers.  Location: Patient: Home Provider: Office    I discussed the limitations of evaluation and management by telemedicine and the availability of in person appointments. The patient expressed understanding and agreed to proceed.    I discussed the assessment and treatment plan with the patient. The patient was provided an opportunity to ask questions and all were answered. The patient agreed with the plan and demonstrated an understanding of the instructions.   The patient was advised to call back or seek an in-person evaluation if the symptoms worsen or if the condition fails to improve as anticipated.  I provided 15 minutes of non-face-to-face time during this encounter.   [name], NP   [facility] Discharge Summary  Gail [name] [id number]  Admission date: 04/25/2021 Discharge date: 05/30/2021  Reason for admission: Per admission assessment note: Gail [name] 20 year old female with a history of depression with psychotic features, generalized anxiety disorder and reported history with schizophrenia.  She reports that she recently had an evaluation with her therapist Abel Presto who she reported suggested that she returns back to intensive outpatient programming.  Denied that she followed up with psychiatry after previous discharge earlier this year.  Unclear if patient had been taking medications as directed.  Patient was initially prescribed Abilify, Lexapro and hydroxyzine.  She reports " I do not feel as if the medications is working" she continues to endorse auditory and visual hallucinations.  Reports worsening depression.  Denies suicidal or homicidal ideations.  Patient to follow-up for labs  EKG and urine pregnancy.  Will initiate Zoloft 25 mg with titration to 50 mg daily and start Seroquel 25 mg p.o. nightly.     Progress in Program Toward Treatment Goals: Ongoing, Patient attended and participated with daily group session with active and engaged participation.  Initially was admitted to intensive outpatient programming and reported worsening depression and auditory hallucinations.  Patient was then seen and evaluated by [facility] facility based crisis center x3 days.  She completed intensive outpatient programming.  Denying suicidal or homicidal ideations.  Denies auditory or visual hallucinations.  Staff reported patient had stopped taking medications due to side effects.  States her primary care provider advised her to discontinue medications.  Patient denies a history with this provider.  Reports she continues to take medications as indicated.  Reports a follow-up appointment with [facility] and [facility].  Support, encouragement and  reassurance was provided.  Progress (rationale):  [name] to follow-up with [facility] on Oct 18th for medication management and therapy services .  Take all medications as prescribed. Keep all follow-up appointments as scheduled.  Do not consume alcohol or use illegal drugs while on prescription medications. Report any adverse effects from your medications to your primary care provider promptly.  In the event of recurrent symptoms or worsening symptoms, call 911, a crisis hotline, or go to the nearest emergency department for evaluation.    [name], NP 05/30/2021

## 2021-07-23 ENCOUNTER — Encounter (HOSPITAL_COMMUNITY): Payer: Self-pay | Admitting: Emergency Medicine

## 2021-07-23 ENCOUNTER — Ambulatory Visit
Admission: EM | Admit: 2021-07-23 | Discharge: 2021-07-23 | Disposition: A | Discharge status: Home / Self Care | Payer: 59

## 2021-07-23 ENCOUNTER — Emergency Department (HOSPITAL_COMMUNITY)
Admission: EM | Admit: 2021-07-23 | Discharge: 2021-07-24 | Disposition: A | Discharge status: Home / Self Care | Payer: 59 | Attending: Emergency Medicine | Admitting: Emergency Medicine

## 2021-07-23 ENCOUNTER — Other Ambulatory Visit: Payer: Self-pay

## 2021-07-23 ENCOUNTER — Emergency Department (HOSPITAL_COMMUNITY): Payer: 59

## 2021-07-23 ENCOUNTER — Encounter: Payer: Self-pay | Admitting: Emergency Medicine

## 2021-07-23 DIAGNOSIS — R4589 Other symptoms and signs involving emotional state: Secondary | ICD-10-CM

## 2021-07-23 DIAGNOSIS — F333 Major depressive disorder, recurrent, severe with psychotic symptoms: Secondary | ICD-10-CM | POA: Diagnosis not present

## 2021-07-23 DIAGNOSIS — Z20822 Contact with and (suspected) exposure to covid-19: Secondary | ICD-10-CM | POA: Diagnosis not present

## 2021-07-23 DIAGNOSIS — R45851 Suicidal ideations: Secondary | ICD-10-CM | POA: Diagnosis not present

## 2021-07-23 DIAGNOSIS — J069 Acute upper respiratory infection, unspecified: Secondary | ICD-10-CM | POA: Insufficient documentation

## 2021-07-23 DIAGNOSIS — Z79899 Other long term (current) drug therapy: Secondary | ICD-10-CM | POA: Diagnosis not present

## 2021-07-23 DIAGNOSIS — R059 Cough, unspecified: Secondary | ICD-10-CM | POA: Diagnosis present

## 2021-07-23 DIAGNOSIS — F332 Major depressive disorder, recurrent severe without psychotic features: Secondary | ICD-10-CM | POA: Insufficient documentation

## 2021-07-23 LAB — COMPREHENSIVE METABOLIC PANEL
ALT: 15 U/L (ref 0–44)
AST: 16 U/L (ref 15–41)
Albumin: 4.3 g/dL (ref 3.5–5.0)
Alkaline Phosphatase: 51 U/L (ref 38–126)
Anion gap: 8 (ref 5–15)
BUN: 10 mg/dL (ref 6–20)
CO2: 24 mmol/L (ref 22–32)
Calcium: 9.3 mg/dL (ref 8.9–10.3)
Chloride: 104 mmol/L (ref 98–111)
Creatinine, Ser: 0.76 mg/dL (ref 0.44–1.00)
GFR, Estimated: >60 mL/min (ref >60)
Glucose, Bld: 89 mg/dL (ref 70–99)
Potassium: 3.7 mmol/L (ref 3.5–5.1)
Sodium: 136 mmol/L (ref 135–145)
Total Bilirubin: 1.7 mg/dL — ABNORMAL HIGH (ref 0.3–1.2)
Total Protein: 7.5 g/dL (ref 6.5–8.1)

## 2021-07-23 LAB — CBC WITH DIFFERENTIAL/PLATELET
Abs Immature Granulocytes: 0.02 10*3/uL (ref 0.00–0.07)
Basophils Absolute: 0 10*3/uL (ref 0.0–0.1)
Basophils Relative: 1 %
Eosinophils Absolute: 0.1 10*3/uL (ref 0.0–0.5)
Eosinophils Relative: 1 %
HCT: 43.3 % (ref 36.0–46.0)
Hemoglobin: 13.9 g/dL (ref 12.0–15.0)
Immature Granulocytes: 0 %
Lymphocytes Relative: 40 %
Lymphs Abs: 3.4 10*3/uL (ref 0.7–4.0)
MCH: 30.8 pg (ref 26.0–34.0)
MCHC: 32.1 g/dL (ref 30.0–36.0)
MCV: 96 fL (ref 80.0–100.0)
Monocytes Absolute: 0.4 10*3/uL (ref 0.1–1.0)
Monocytes Relative: 5 %
Neutro Abs: 4.5 10*3/uL (ref 1.7–7.7)
Neutrophils Relative %: 53 %
Platelets: 352 10*3/uL (ref 150–400)
RBC: 4.51 MIL/uL (ref 3.87–5.11)
RDW: 12.3 % (ref 11.5–15.5)
WBC: 8.4 10*3/uL (ref 4.0–10.5)
nRBC: 0 % (ref 0.0–0.2)

## 2021-07-23 LAB — I-STAT BETA HCG BLOOD, ED (MC, WL, AP ONLY): I-stat hCG, quantitative: <5 m[IU]/mL (ref <5)

## 2021-07-23 LAB — RAPID URINE DRUG SCREEN, HOSP PERFORMED
Amphetamines: NOT DETECTED
Barbiturates: NOT DETECTED
Benzodiazepines: NOT DETECTED
Cocaine: NOT DETECTED
Opiates: NOT DETECTED
Tetrahydrocannabinol: NOT DETECTED

## 2021-07-23 LAB — RESP PANEL BY RT-PCR (FLU A&B, COVID) ARPGX2
Influenza A by PCR: NEGATIVE
Influenza B by PCR: NEGATIVE
SARS Coronavirus 2 by RT PCR: NEGATIVE

## 2021-07-23 LAB — SALICYLATE LEVEL: Salicylate Lvl: <7 mg/dL — ABNORMAL LOW (ref 7.0–30.0)

## 2021-07-23 LAB — ACETAMINOPHEN LEVEL: Acetaminophen (Tylenol), Serum: <10 ug/mL — ABNORMAL LOW (ref 10–30)

## 2021-07-23 NOTE — ED Provider Notes (Signed)
[facility]    CSN: [id number] Arrival date & time: 07/23/21  1713      History   Chief Complaint Chief Complaint  Patient presents with   Cough   Sore Throat    HPI Gail [name] is a 20 y.o. female.   Patient initially presents with complaints of congestion and cough she has had the last few weeks. She then starts to report random body aches, some of which affect her movement. In questioning with screening she admitted to thoughts of self harm. She has had this in the past and is currently being seen by psychiatry. She states she has recently had her medication dose increased but has not been able to obtain new dosing. She reports that she has had increased stress recently with school and work. Mom is currently on vacation in unknown location and will be gone for unknown period of time. Patient feels other family members are not very supportive. She does have history of inpatient admission for severe depression. When discussing further with patient regarding current plan for self harm she does admit to most recently hitting herself in the head because this will make her "feel better". She is worried about work and school if she is admitted but states she feels if she goes home "something bad will happen".   The history is provided by the patient.  Cough Associated symptoms: myalgias   Associated symptoms: no chills, no eye discharge and no fever   Sore Throat Pertinent negatives include no abdominal pain.   Past Medical History:  Diagnosis Date   Anxiety    Depression    Medical history non-contributory    Vision abnormalities     Patient Active Problem List   Diagnosis Date Noted   Panic 05/08/2021   MDD (major depressive disorder), recurrent, severe, with psychosis ([place]) 05/04/2021   Initiation of Depo Provera 09/24/2018   MDD (major depressive disorder), single episode, severe with psychosis ([place]) 05/03/2018   Severe major depression with  psychotic features, mood-congruent ([place]) 04/28/2018   Suicide ideation 04/28/2018   GAD (generalized anxiety disorder) 04/28/2018    Past Surgical History:  Procedure Laterality Date   WISDOM TOOTH EXTRACTION      OB History     Gravida  0   Para  0   Term  0   Preterm  0   AB  0   Living  0      SAB  0   IAB  0   Ectopic  0   Multiple  0   Live Births  0            Home Medications    Prior to Admission medications   Medication Sig Start Date End Date Taking? Authorizing Provider  loperamide (IMODIUM) 2 MG capsule Take 1 capsule (2 mg total) by mouth 2 (two) times daily as needed for diarrhea or loose stools. 05/23/21   Jaynee Eagles, PA-C  ondansetron (ZOFRAN-ODT) 8 MG disintegrating tablet Take 1 tablet (8 mg total) by mouth every 8 (eight) hours as needed for nausea or vomiting. 05/23/21   Jaynee Eagles, PA-C  QUEtiapine (SEROQUEL) 25 MG tablet Take 1 tablet (25 mg total) by mouth at bedtime for 7 days. 05/07/21 05/14/21  Ival Bible, MD  sertraline (ZOLOFT) 50 MG tablet Take 1 tablet (50 mg total) by mouth daily. 05/08/21   Ival Bible, MD  pantoprazole (PROTONIX) 40 MG tablet Take 1 tablet (40 mg total) by mouth  2 (two) times daily. Patient not taking: Reported on 06/16/2020 07/21/19 06/16/20  Tressia Danas, MD  promethazine (PHENERGAN) 25 MG tablet Take 1 tablet (25 mg total) by mouth every 6 (six) hours as needed for nausea or vomiting. Patient not taking: Reported on 06/16/2020 05/27/19 06/16/20  Jacalyn Lefevre, MD    Family History Family History  Problem Relation Age of Onset   Rheum arthritis Mother    Lupus Mother    Obesity Mother    Hypertension Mother    Kidney disease Mother    Healthy Sister    Breast cancer Maternal Grandmother    Cancer Maternal Grandmother        breast cancer   Diabetes Maternal Grandmother    Hypertension Maternal Grandmother    Hyperlipidemia Maternal Grandmother    Breast cancer Cousin     Diabetes Father     Social History Social History   Tobacco Use   Smoking status: Never   Smokeless tobacco: Never  Vaping Use   Vaping Use: Never used  Substance Use Topics   Alcohol use: Yes    Comment: Rare   Drug use: Yes    Frequency: 3.0 times per week    Types: Marijuana     Allergies   Patient has no known allergies.   Review of Systems Review of Systems  Constitutional:  Negative for chills and fever.  HENT:  Positive for congestion.   Eyes:  Negative for discharge and redness.  Respiratory:  Positive for cough.   Gastrointestinal:  Negative for abdominal pain, diarrhea, nausea and vomiting.  Genitourinary:  Positive for vaginal bleeding and vaginal discharge.  Musculoskeletal:  Positive for myalgias.  Psychiatric/Behavioral:  Positive for dysphoric mood, self-injury and suicidal ideas. The patient is nervous/anxious.     Physical Exam Triage Vital Signs ED Triage Vitals [07/23/21 1818]  Enc Vitals Group     BP (!) 130/103     Pulse Rate 87     Resp 16     Temp (!) 97.4 F (36.3 C)     Temp Source Oral     SpO2 99 %     Weight      Height      Head Circumference      Peak Flow      Pain Score      Pain Loc      Pain Edu?      Excl. in GC?    No data found.  Updated Vital Signs BP (!) 130/103 (BP Location: Left Arm)   Pulse 87   Temp (!) 97.4 F (36.3 C) (Oral)   Resp 16   SpO2 99%      Physical Exam Vitals and nursing note reviewed.  Constitutional:      General: She is in acute distress (tearful throughout exam).     Appearance: Normal appearance. She is well-developed.  HENT:     Head: Normocephalic and atraumatic.  Eyes:     Conjunctiva/sclera: Conjunctivae normal.  Cardiovascular:     Rate and Rhythm: Normal rate.  Pulmonary:     Effort: Pulmonary effort is normal.  Neurological:     Mental Status: She is alert.  Psychiatric:        Mood and Affect: Mood is anxious (picking at fingers, poor eye contact) and depressed.  Affect is tearful.        Behavior: Behavior is withdrawn.        Thought Content: Thought content includes suicidal ideation.  Comments: Speech- mumbles, quiet, poor enunciation     UC Treatments / Results  Labs (all labs ordered are listed, but only abnormal results are displayed) Labs Reviewed - No data to display  EKG   Radiology No results found.  Procedures Procedures (including critical care time)  Medications Ordered in UC Medications - No data to display  Initial Impression / Assessment and Plan / UC Course  I have reviewed the triage vital signs and the nursing notes.  Pertinent labs & imaging results that were available during my care of the patient were reviewed by me and considered in my medical decision making (see chart for details).    Given patient history, current symptoms and concern for self harm recommended further evaluation in the ED which patient feels safe with. She does not feel safe returning home tonight. Patient transported herself here today because she reports no family offered to drive her. She is agreeable to EMS transport to ED. I did briefly discuss with patients that other symptoms, specifically body aches, may be related to depression.   Final Clinical Impressions(s) / UC Diagnoses   Final diagnoses:  At risk for self harm  MDD (major depressive disorder), recurrent, severe, with psychosis [facility])   Discharge Instructions   None    ED Prescriptions   None    PDMP not reviewed this encounter.   Francene Finders, PA-C 07/23/21 [phone]

## 2021-07-23 NOTE — ED Notes (Addendum)
Patient is being discharged from the Urgent Care and sent to the Emergency Department via EMS . Per Erma Pinto PA, patient is in need of higher level of care due to suicidal intentions. Patient is aware and verbalizes understanding of plan of care. Patient verbalized to provider that she did not feel safe leaving the facility here tonight. Patient sitting near nurses station until transport arrives. Denies any possession of weapons/pills on person, removed purse from room, in labeled patient belongings bag, will be given to EMS for transport. Vitals:   07/23/21 1818  BP: (!) 130/103  Pulse: 87  Resp: 16  Temp: (!) 97.4 F (36.3 C)  SpO2: 99%

## 2021-07-23 NOTE — ED Triage Notes (Signed)
Pt BIBA from [facility] after being seen for flu-like symptoms x2 weeks. Per EMS staff she was transported here d/t verbalized suicidal ideations. VS [facility]. Hx GAD and MDD.

## 2021-07-23 NOTE — ED Triage Notes (Addendum)
Runny nose, productive cough, sore throat, generalized body aches x 2 weeks.

## 2021-07-23 NOTE — ED Provider Notes (Signed)
[facility] DEPT Provider Note   CSN: [id number] Arrival date & time: 07/23/21  2022     History Chief Complaint  Patient presents with   Fever   Suicidal    Gail [name] is a 20 y.o. female.  HPI Patient is a 20 year old female with a history of of anxiety, depression, who presents to the emergency department with multiple complaints.  Patient states that for the past 2-weeks she has been experiencing cough, fatigue, rhinorrhea.  Reports intermittent shortness of breath.  No chest pain.  No nausea, vomiting, diarrhea.  No urinary complaints.  Patient initially went to urgent care for symptoms but then began expressing concern for SI and was sent to the emergency department for further evaluation.  Patient states that she has a history of SI in the past.  She states that last week she considered "cutting her wrist" and was "holding the knife" but states that her mother stopped her.  She denies cutting herself.  Patient reports a history of inpatient admission in the past for her symptoms.    Past Medical History:  Diagnosis Date   Anxiety    Depression    Medical history non-contributory    Vision abnormalities     Patient Active Problem List   Diagnosis Date Noted   Panic 05/08/2021   MDD (major depressive disorder), recurrent, severe, with psychosis ([place]) 05/04/2021   Initiation of Depo Provera 09/24/2018   MDD (major depressive disorder), single episode, severe with psychosis ([place]) 05/03/2018   Severe major depression with psychotic features, mood-congruent ([place]) 04/28/2018   Suicide ideation 04/28/2018   GAD (generalized anxiety disorder) 04/28/2018    Past Surgical History:  Procedure Laterality Date   WISDOM TOOTH EXTRACTION       OB History     Gravida  0   Para  0   Term  0   Preterm  0   AB  0   Living  0      SAB  0   IAB  0   Ectopic  0   Multiple  0   Live Births  0           Family  History  Problem Relation Age of Onset   Rheum arthritis Mother    Lupus Mother    Obesity Mother    Hypertension Mother    Kidney disease Mother    Healthy Sister    Breast cancer Maternal Grandmother    Cancer Maternal Grandmother        breast cancer   Diabetes Maternal Grandmother    Hypertension Maternal Grandmother    Hyperlipidemia Maternal Grandmother    Breast cancer Cousin    Diabetes Father     Social History   Tobacco Use   Smoking status: Never   Smokeless tobacco: Never  Vaping Use   Vaping Use: Never used  Substance Use Topics   Alcohol use: Yes    Comment: Rare   Drug use: Yes    Frequency: 3.0 times per week    Types: Marijuana    Home Medications Prior to Admission medications   Medication Sig Start Date End Date Taking? Authorizing Provider  loperamide (IMODIUM) 2 MG capsule Take 1 capsule (2 mg total) by mouth 2 (two) times daily as needed for diarrhea or loose stools. 05/23/21   Jaynee Eagles, PA-C  ondansetron (ZOFRAN-ODT) 8 MG disintegrating tablet Take 1 tablet (8 mg total) by mouth every 8 (eight) hours as needed  for nausea or vomiting. 05/23/21   Jaynee Eagles, PA-C  QUEtiapine (SEROQUEL) 25 MG tablet Take 1 tablet (25 mg total) by mouth at bedtime for 7 days. 05/07/21 05/14/21  Ival Bible, MD  sertraline (ZOLOFT) 50 MG tablet Take 1 tablet (50 mg total) by mouth daily. 05/08/21   Ival Bible, MD  pantoprazole (PROTONIX) 40 MG tablet Take 1 tablet (40 mg total) by mouth 2 (two) times daily. Patient not taking: Reported on 06/16/2020 07/21/19 06/16/20  Thornton Park, MD  promethazine (PHENERGAN) 25 MG tablet Take 1 tablet (25 mg total) by mouth every 6 (six) hours as needed for nausea or vomiting. Patient not taking: Reported on 06/16/2020 05/27/19 06/16/20  Isla Pence, MD    Allergies    Patient has no known allergies.  Review of Systems   Review of Systems  All other systems reviewed and are negative. Ten systems reviewed  and are negative for acute change, except as noted in the HPI.   Physical Exam Updated Vital Signs BP 119/89 (BP Location: Right Arm)   Pulse 89   Temp 98.8 F (37.1 C) (Oral)   Resp 16   SpO2 96%   Physical Exam Vitals and nursing note reviewed.  Constitutional:      General: She is not in acute distress.    Appearance: Normal appearance. She is not ill-appearing, toxic-appearing or diaphoretic.  HENT:     Head: Normocephalic and atraumatic.     Right Ear: External ear normal.     Left Ear: External ear normal.     Nose: Nose normal.     Mouth/Throat:     Mouth: Mucous membranes are moist.     Pharynx: Oropharynx is clear. No oropharyngeal exudate or posterior oropharyngeal erythema.  Eyes:     Extraocular Movements: Extraocular movements intact.     Conjunctiva/sclera: Conjunctivae normal.  Cardiovascular:     Rate and Rhythm: Normal rate and regular rhythm.     Pulses: Normal pulses.     Heart sounds: Normal heart sounds. No murmur heard.   No friction rub. No gallop.  Pulmonary:     Effort: Pulmonary effort is normal. No respiratory distress.     Breath sounds: Normal breath sounds. No stridor. No wheezing, rhonchi or rales.  Abdominal:     General: Abdomen is flat.     Tenderness: There is no abdominal tenderness.  Musculoskeletal:        General: Normal range of motion.     Cervical back: Normal range of motion and neck supple. No tenderness.  Skin:    General: Skin is warm and dry.  Neurological:     General: No focal deficit present.     Mental Status: She is alert and oriented to person, place, and time.  Psychiatric:        Mood and Affect: Mood is depressed.        Speech: Speech is delayed.        Behavior: Behavior is slowed and withdrawn. Behavior is not agitated.        Thought Content: Thought content is not paranoid or delusional. Thought content includes suicidal ideation. Thought content does not include homicidal ideation. Thought content does not  include homicidal or suicidal plan.   ED Results / Procedures / Treatments   Labs (all labs ordered are listed, but only abnormal results are displayed) Labs Reviewed  COMPREHENSIVE METABOLIC PANEL - Abnormal; Notable for the following components:      Result Value  Total Bilirubin 1.7 (*)    All other components within normal limits  ACETAMINOPHEN LEVEL - Abnormal; Notable for the following components:   Acetaminophen (Tylenol), Serum <10 (*)    All other components within normal limits  SALICYLATE LEVEL - Abnormal; Notable for the following components:   Salicylate Lvl <7.0 (*)    All other components within normal limits  RESP PANEL BY RT-PCR (FLU A&B, COVID) ARPGX2  CBC WITH DIFFERENTIAL/PLATELET  RAPID URINE DRUG SCREEN, HOSP PERFORMED  I-STAT BETA HCG BLOOD, ED ([facility], [facility], [facility] ONLY)   EKG None  Radiology DG Chest Portable 1 View  Result Date: 07/23/2021 CLINICAL DATA:  Productive cough with runny nose, sore throat and generalized body aches. EXAM: PORTABLE CHEST 1 VIEW COMPARISON:  April 02, 2020 FINDINGS: The heart size and mediastinal contours are within normal limits. Both lungs are clear. The visualized skeletal structures are unremarkable. IMPRESSION: No active disease. Electronically Signed   By: Aram Candela M.D.   On: 07/23/2021 22:01    Procedures Procedures   Medications Ordered in ED Medications - No data to display  ED Course  I have reviewed the triage vital signs and the nursing notes.  Pertinent labs & imaging results that were available during my care of the patient were reviewed by me and considered in my medical decision making (see chart for details).    MDM Rules/Calculators/A&P                          Pt is a 21 y.o. female who presents to the emergency department due to fatigue, cough, rhinorrhea, as well as SI.  Labs: CBC, CMP, acetaminophen level, salicylate level, respiratory panel, UDS, i-STAT beta-hCG with abnormalities as noted  above.  Imaging: Chest x-ray is negative.  I, Placido Sou, PA-C, personally reviewed and evaluated these images and lab results as part of my medical decision-making.  Lab work and imaging all appears reassuring.  Symptoms likely due to a viral URI.  Patient also has history of anxiety as well as depression.  Reports worsening SI.  Feel that patient is medically cleared at this time.  Patient will require TTS consultation.  This has been placed.  Disposition pending based on TTS recommendations.  Note: Portions of this report may have been transcribed using voice recognition software. Every effort was made to ensure accuracy; however, inadvertent computerized transcription errors may be present.   Final Clinical Impression(s) / ED Diagnoses Final diagnoses:  Suicidal ideation  Viral URI   Rx / DC Orders ED Discharge Orders     None        Placido Sou, PA-C 07/23/21 2344    Charlynne Pander, MD 07/24/21 [phone]

## 2021-07-23 NOTE — ED Notes (Signed)
Police arrived on scene instead of the specifically requested EMS. Called [facility] communication to clarify, and they stated EMS supervisor requested police be sent over first to evaluate for transport. Police stated they could not transport due to the patient having medical issues. 911 stated they dispatched an ambulance to our facility at this time.

## 2021-07-24 ENCOUNTER — Other Ambulatory Visit (HOSPITAL_COMMUNITY)
Admission: EM | Admit: 2021-07-24 | Discharge: 2021-07-27 | Disposition: A | Discharge status: Home / Self Care | Payer: 59 | Attending: Psychiatry | Admitting: Psychiatry

## 2021-07-24 DIAGNOSIS — F322 Major depressive disorder, single episode, severe without psychotic features: Secondary | ICD-10-CM | POA: Diagnosis present

## 2021-07-24 MED ORDER — QUETIAPINE FUMARATE 25 MG PO TABS
25.0000 mg | ORAL_TABLET | Freq: Every day | ORAL | Status: DC
  Administered 2021-07-25 – 2021-07-26 (×2): 25 mg via ORAL
  Filled 2021-07-24: qty 7
  Filled 2021-07-24 (×2): qty 1

## 2021-07-24 MED ORDER — ACETAMINOPHEN 325 MG PO TABS: 650.0000 mg | ORAL_TABLET | Freq: Four times a day (QID) | ORAL | Status: DC | PRN

## 2021-07-24 MED ORDER — HYDROXYZINE HCL 25 MG PO TABS: 25.0000 mg | ORAL_TABLET | Freq: Three times a day (TID) | ORAL | Status: DC | PRN

## 2021-07-24 MED ORDER — TRAZODONE HCL 50 MG PO TABS
50.0000 mg | ORAL_TABLET | Freq: Every evening | ORAL | Status: DC | PRN
  Administered 2021-07-25 – 2021-07-26 (×2): 50 mg via ORAL
  Filled 2021-07-24 (×2): qty 1

## 2021-07-24 MED ORDER — SERTRALINE HCL 50 MG PO TABS
50.0000 mg | ORAL_TABLET | Freq: Every day | ORAL | Status: DC
  Administered 2021-07-24 – 2021-07-27 (×4): 50 mg via ORAL
  Filled 2021-07-24 (×3): qty 1
  Filled 2021-07-24: qty 7
  Filled 2021-07-24: qty 1

## 2021-07-24 MED ORDER — ALUM & MAG HYDROXIDE-SIMETH 200-200-20 MG/5ML PO SUSP: 30.0000 mL | ORAL | Status: DC | PRN

## 2021-07-24 MED ORDER — MAGNESIUM HYDROXIDE 400 MG/5ML PO SUSP: 30.0000 mL | Freq: Every day | ORAL | Status: DC | PRN

## 2021-07-24 NOTE — ED Notes (Addendum)
Pt is sleeping in the bed at present. Respirations are even and unlabored. No acute distress noted. Will continue to monitor for safety. 
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
## 2021-07-24 NOTE — ED Notes (Signed)
Patient admitted to [facility].  She is calm and cooperative with soft speech and clear thought.  She reports having thoughts of wanting to harm self with a knife and feels safe here as she cannot access the tools she would use to harm self.  Patient agrees to seek out staff if overwhelmed by thoughts or feelings.  Patient shown to her room and oriented to unit.  She was given toiletries, towels and blankets.  Will monitor and encourage her to make needs known to staff.

## 2021-07-24 NOTE — BH Assessment (Signed)
BHH Assessment Progress Note   Per Melbourne Abts, PA, this pt would benefit from admission to [facility] at this time.  Earlene Plater, MD has agreed to accept pt.  Please have pt sign Voluntary Admission and Consent for Treatment, fax signed form to [phone].  EDP Melene Plan, DO and charge nurse Colin Mulders have been notified.  Please send original paperwork along with pt via [employer], and to call report to [phone].  Doylene Canning, [place] Behavioral Health Coordinator [phone]

## 2021-07-24 NOTE — ED Notes (Signed)
Patient declined to attended wrap up and AA group after encouragement from staff.

## 2021-07-24 NOTE — BH Assessment (Signed)
BHH Assessment Progress Note   Per Melbourne Abts, PA, this pt would benefit from admission to Facility Based Crisis at this time.  Earlene Plater, MD has agreed to accept pt.  Pt has signed Voluntary Admission and Consent for Treatment, as well as Consent to Release Information to [facility] and Public house manager of Students, as well as pt's mother and sister, and signed forms have been faxed to [facility].  EDP Melene Plan, DO and pt's nurse, Silvio Pate, have been notified, and Silvio Pate agrees to send original paperwork along with pt via [employer], and to call report to [phone].  Doylene Canning, [place] Behavioral Health Coordinator [phone]

## 2021-07-24 NOTE — Progress Notes (Signed)
Patient is awake and alert, calm and in good behavioral control.  No evidence of self harm or verbalization of suicidal ideas. Encouraged her to seek out staff if overwhelmed by thoughts or feelings.  Will monitor.

## 2021-07-24 NOTE — ED Provider Notes (Signed)
Behavioral Health Admission H&P [facility] & OBS)  Date: 07/24/21 Patient Name: Gail [name] MRN: [id number] Chief Complaint: No chief complaint on file.     Diagnoses:  Final diagnoses:  MDD (major depressive disorder), severe (HCC)    HPI:  20 year old female with history of MDD and anxiety who presented to the [facility], ED on 12/5 for flulike symptoms and suicidal thoughts.  S patient was unable to contract for safety after being medically cleared and was transferred to the [facility] on 12/6 for further treatment.  Patient states her reason for presenting to the hospital was "stressed and anxiety has risen".  She describes stress predominantly related to school.  She states that she has 2 finals yet that she has to complete and that they were due today.  Patient reports generally doing well in all of her classes at [employer] and states that she has all A's.  Patient also cited stressors as work and family.  Patient describes becoming easily agitated with her 60-year-old cousin and having thoughts about wanting to hurt him.  Patient states when she experiences these thoughts that she will walk away and denies that she has ever physically harmed him.  Patient states that the cousin does not reside with her in the home.  Patient states that she did complete the intensive outpatient program and that "it went well, I got help".  Patient states that after completing the program in October she transitioned to outpatient care through [facility].  Patient states that she sees a provider approximately once a month and a therapist every 2 weeks.  Patient states that her outpatient provider recently increased her Zoloft dose to 50 mg although she has had yet to pick up the medications.  Patient describes her current mood is depressed.  She rates her mood as a 2 out of 10 (10 being the best).  She reports associated depressive symptoms of poor sleep (sleeps approximately 4 hours a night), anhedonia, feelings  of guilt and hopelessness, decreased energy, concentration problems, and variable appetite.  She is unsure of any weight changes but does report that her close that a little tighter than they used to.  Patient denies current SI but states "life is worthless".  She denies HI/AVH.  She denies TI/TW/TB.  She discusses difficulty in people "taking mental health seriously" and describes as though she is not being heard by members of her support system.  She denies symptoms that would be consistent with hypomania/mania.  She states that the last time that she felt like her normal self was "months ago".  Patient cites her reasons for not wanting to end her life as her family and her goal to become a Child psychotherapist.  She cites her support system as her mother, 2 uncles, and an older sister (age 22).  Denies that she has had her medications today-discussed starting increased dose of  Zoloft to 50 mg which she has had yet to start that was prescribed to her outpatient provider, and to continue Seroquel 25 mg..  Patient is amenable.  Patient denies medical history, taking additional medications.  Patient denies allergies.  Past Psychiatric History: Previous Medication Trials: zoloft, seroquel.  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  Patient plans to become a Child psychotherapist.  Reports current grades as always. Special Ed: no Housing Status: with mother History of phys/sexual abuse: no Easy access to gun: States that there is a gun in the home and she does not believe that it is  secured.  Substance Use (with emphasis over the last 12 months) Recreational Drugs: denies Use of Alcohol: minimal use Tobacco Use: yes Rehab History: no H/O Complicated Withdrawal: no  Legal History: Past Charges/Incarcerations: denies Pending charges: denies  Family Psychiatric History: Uncle with completed suicide     PHQ 2-9:  Flowsheet Row ED from 05/04/2021 in [facility] Counselor from 04/25/2021 in BEHAVIORAL HEALTH INTENS[facility] Office Visit from 01/02/2021 in [facility]  Thoughts that you would be better off dead, or of hurting yourself in some way Several days Not at all Not at all  PHQ-9 Total Score 16 15 7        Flowsheet Row ED from 07/24/2021 in [facility] Most recent reading at 07/24/2021  2:19 PM ED from 07/23/2021 in [facility] Most recent reading at 07/23/2021  8:35 PM ED from 07/23/2021 in [facility]  Most recent reading at 07/23/2021  6:20 PM  C-SSRS RISK CATEGORY High Risk Low Risk Error: Q2 is Yes, you must answer 3, 4, and 5        Total Time spent with patient: 45 minutes  Musculoskeletal  Strength & Muscle Tone: within normal limits Gait & Station: normal Patient leans: N/A  Psychiatric Specialty Exam  Presentation General Appearance: Appropriate for Environment; Casual  Eye Contact:Fair  Speech:Clear and Coherent; Normal Rate  Speech Volume:Normal  Handedness:Right   Mood and Affect  Mood:Depressed ("2/10")  Affect:Appropriate; Congruent; Depressed; Restricted   Thought Process  Thought Processes:Coherent; Goal Directed; Linear  Descriptions of Associations:Intact  Orientation:Full (Time, Place and Person)  Thought Content:WDL; Logical  Diagnosis of Schizophrenia or Schizoaffective disorder in past: Yes  Duration of Psychotic Symptoms: Greater than six  months  Hallucinations:Hallucinations: None Ideas of Reference:None  Suicidal Thoughts:Suicidal Thoughts: No Homicidal Thoughts:Homicidal Thoughts: No  Sensorium  Memory:Immediate Good; Recent Good; Remote Good  Judgment:Fair  Insight:Fair   Executive Functions  Concentration:Fair  Attention Span:Fair  Recall:Fair  Fund of Knowledge:Fair  Language:Fair   Psychomotor Activity  Psychomotor Activity:Psychomotor Activity: Normal  Assets  Assets:Communication Skills; Desire for Improvement; Housing; Physical Health; Resilience; Social Support; Vocational/Educational   Sleep  Sleep:Sleep: Poor  Nutritional Assessment (For OBS and FBC admissions only) Has the patient had a weight loss or gain of 10 pounds or more in the last 3 months?: -- (unknown) Has the patient had a decrease in food intake/or appetite?: -- (variable) Does the patient have dental problems?: No Does the patient have eating habits or behaviors that may be indicators of an eating disorder including binging or inducing vomiting?: No Has the patient recently lost weight without trying?: 0 Has the patient been eating poorly because of a decreased appetite?: 1 Malnutrition Screening Tool Score: 1   Physical Exam Constitutional:      Appearance: Normal appearance. She is normal weight.  HENT:     Head: Normocephalic and atraumatic.  Eyes:     Extraocular Movements: Extraocular movements intact.     Conjunctiva/sclera: Conjunctivae normal.  Cardiovascular:     Rate and Rhythm:  Normal rate and regular rhythm.     Heart sounds: Normal heart sounds.  Pulmonary:     Effort: Pulmonary effort is normal.     Breath sounds: Normal breath sounds.  Abdominal:     General: Abdomen is flat. Bowel sounds are normal.     Palpations: Abdomen is soft.  Neurological:     Mental Status: She is alert and oriented to person, place, and time.   Review of Systems  Constitutional:  Negative for chills and fever.   HENT:  Negative for hearing loss.   Eyes:  Negative for discharge and redness.  Respiratory:  Negative for cough.   Cardiovascular:  Negative for chest pain.  Gastrointestinal:  Negative for abdominal pain.  Musculoskeletal:  Negative for myalgias.  Neurological:  Negative for headaches.  Psychiatric/Behavioral:  Positive for depression. Negative for hallucinations, substance abuse and suicidal ideas. The patient is nervous/anxious.    Blood pressure 114/85, pulse 90, temperature 98.3 F (36.8 C), temperature source Oral, resp. rate 18, SpO2 99 %. There is no height or weight on file to calculate BMI.  Past Psychiatric History: MDD   Is the patient at risk to self? No  Has the patient been a risk to self in the past 6 months? No .    Has the patient been a risk to self within the distant past? Yes   Is the patient a risk to others? No   Has the patient been a risk to others in the past 6 months? No   Has the patient been a risk to others within the distant past? No   Past Medical History:  Past Medical History:  Diagnosis Date   Anxiety    Depression    Medical history non-contributory    Vision abnormalities     Past Surgical History:  Procedure Laterality Date   WISDOM TOOTH EXTRACTION      Family History:  Family History  Problem Relation Age of Onset   Rheum arthritis Mother    Lupus Mother    Obesity Mother    Hypertension Mother    Kidney disease Mother    Healthy Sister    Breast cancer Maternal Grandmother    Cancer Maternal Grandmother        breast cancer   Diabetes Maternal Grandmother    Hypertension Maternal Grandmother    Hyperlipidemia Maternal Grandmother    Breast cancer Cousin    Diabetes Father     Social History:  Social History   Socioeconomic History   Marital status: Single    Spouse name: Not on file   Number of children: Not on file   Years of education: Not on file   Highest education level: Not on file  Occupational History    Not on file  Tobacco Use   Smoking status: Never   Smokeless tobacco: Never  Vaping Use   Vaping Use: Never used  Substance and Sexual Activity   Alcohol use: Yes    Comment: Rare   Drug use: Yes    Frequency: 3.0 times per week    Types: Marijuana   Sexual activity: Not Currently    Birth control/protection: Injection    Comment: 1st intercourse- 16, partners- 2,   Other Topics Concern   Not on file  Social History Narrative   Not on file   Social Determinants of Health   Financial Resource Strain: Not on file  Food Insecurity: Not on file  Transportation Needs: Not on file  Physical Activity: Not on file  Stress: Not on file  Social Connections: Not on file  Intimate Partner Violence: Not on file    SDOH:  SDOH Screenings   Alcohol Screen: Not on file  Depression (PHQ2-9): Medium Risk   PHQ-2 Score: 16  Financial Resource Strain: Not on file  Food Insecurity: Not on file  Housing: Not on file  Physical Activity: Not on file  Social Connections: Not on file  Stress: Not on file  Tobacco Use: Low Risk    Smoking Tobacco Use: Never   Smokeless Tobacco Use: Never   Passive Exposure: Not on file  Transportation Needs: Not on file    Last Labs:  Admission on 07/23/2021, Discharged on 07/24/2021  Component Date Value Ref Range Status   Sodium 07/23/2021 136  135 - 145 mmol/L Final   Potassium 07/23/2021 3.7  3.5 - 5.1 mmol/L Final   Chloride 07/23/2021 104  98 - 111 mmol/L Final   CO2 07/23/2021 24  22 - 32 mmol/L Final   Glucose, Bld 07/23/2021 89  70 - 99 mg/dL Final   Glucose reference range applies only to samples taken after fasting for at least 8 hours.   BUN 07/23/2021 10  6 - 20 mg/dL Final   Creatinine, Ser 07/23/2021 0.76  0.44 - 1.00 mg/dL Final   Calcium 26/83/4196 9.3  8.9 - 10.3 mg/dL Final   Total Protein 22/29/7989 7.5  6.5 - 8.1 g/dL Final   Albumin 21/19/4174 4.3  3.5 - 5.0 g/dL Final   AST 04/01/4817 16  15 - 41 U/L Final   ALT 07/23/2021  15  0 - 44 U/L Final   Alkaline Phosphatase 07/23/2021 51  38 - 126 U/L Final   Total Bilirubin 07/23/2021 1.7 (H)  0.3 - 1.2 mg/dL Final   GFR, Estimated 07/23/2021 >60  >60 mL/min Final   Comment: (NOTE) Calculated using the CKD-EPI Creatinine Equation (2021)    Anion gap 07/23/2021 8  5 - 15 Final   Performed at [facility], [address] [place], [place] [address]   WBC 07/23/2021 8.4  4.0 - 10.5 K/uL Final   RBC 07/23/2021 4.51  3.87 - 5.11 MIL/uL Final   Hemoglobin 07/23/2021 13.9  12.0 - 15.0 g/dL Final   HCT 97/09/6376 43.3  36.0 - 46.0 % Final   MCV 07/23/2021 96.0  80.0 - 100.0 fL Final   MCH 07/23/2021 30.8  26.0 - 34.0 pg Final   MCHC 07/23/2021 32.1  30.0 - 36.0 g/dL Final   RDW 58/85/0277 12.3  11.5 - 15.5 % Final   Platelets 07/23/2021 352  150 - 400 K/uL Final   nRBC 07/23/2021 0.0  0.0 - 0.2 % Final   Neutrophils Relative % 07/23/2021 53  % Final   Neutro Abs 07/23/2021 4.5  1.7 - 7.7 K/uL Final   Lymphocytes Relative 07/23/2021 40  % Final   Lymphs Abs 07/23/2021 3.4  0.7 - 4.0 K/uL Final   Monocytes Relative 07/23/2021 5  % Final   Monocytes Absolute 07/23/2021 0.4  0.1 - 1.0 K/uL Final   Eosinophils Relative 07/23/2021 1  % Final   Eosinophils Absolute 07/23/2021 0.1  0.0 - 0.5 K/uL Final   Basophils Relative 07/23/2021 1  % Final   Basophils Absolute 07/23/2021 0.0  0.0 - 0.1 K/uL Final   Immature Granulocytes 07/23/2021 0  % Final   Abs Immature Granulocytes 07/23/2021 0.02  0.00 - 0.07 K/uL Final   Performed at  [facility], [address], [place], [place] [address]   Acetaminophen (Tylenol), Serum 07/23/2021 <10 (L)  10 - 30 ug/mL Final   Comment: (NOTE) Therapeutic concentrations vary significantly. A range of 10-30 ug/mL  may be an effective concentration for many patients. However, some  are best treated at concentrations outside of this range. Acetaminophen concentrations >150 ug/mL at 4 hours after ingestion   and >50 ug/mL at 12 hours after ingestion are often associated with  toxic reactions.  Performed at [facility], [address], [place], [place] [address]    Salicylate Lvl 07/23/2021 <7.0 (L)  7.0 - 30.0 mg/dL Final   Performed at [facility], [address] [place], [place] [address]   I-stat hCG, quantitative 07/23/2021 <5.0  <5 mIU/mL Final   Comment 3 07/23/2021          Final   Comment:   GEST. AGE      CONC.  (mIU/mL)   <=1 WEEK        5 - 50     2 WEEKS       50 - 500     3 WEEKS       100 - 10,000     4 WEEKS     1,000 - 30,000        FEMALE AND NON-PREGNANT FEMALE:     LESS THAN 5 mIU/mL    SARS Coronavirus 2 by RT PCR 07/23/2021 NEGATIVE  NEGATIVE Final   Comment: (NOTE) SARS-CoV-2 target nucleic acids are NOT DETECTED.  The SARS-CoV-2 RNA is generally detectable in upper respiratory specimens during the acute phase of infection. The lowest concentration of SARS-CoV-2 viral copies this assay can detect is 138 copies/mL. A negative result does not preclude SARS-Cov-2 infection and should not be used as the sole basis for treatment or other patient management decisions. A negative result may occur with  improper specimen collection/handling, submission of specimen other than nasopharyngeal swab, presence of viral mutation(s) within the areas targeted by this assay, and inadequate number of viral copies(<138 copies/mL). A negative result must be combined with clinical observations, patient history, and epidemiological information. The expected result is Negative.  Fact Sheet for Patients:  BloggerCourse.com  Fact Sheet for Healthcare Providers:  SeriousBroker.it  This test is no                          t yet approved or cleared by the [place] FDA and  has been authorized for detection and/or diagnosis of SARS-CoV-2 by FDA under an Emergency Use Authorization (EUA).  This EUA will remain  in effect (meaning this test can be used) for the duration of the COVID-19 declaration under Section 564(b)(1) of the Act, 21 U.S.C.section 360bbb-3(b)(1), unless the authorization is terminated  or revoked sooner.       Influenza A by PCR 07/23/2021 NEGATIVE  NEGATIVE Final   Influenza B by PCR 07/23/2021 NEGATIVE  NEGATIVE Final   Comment: (NOTE) The Xpert Xpress SARS-CoV-2/FLU/RSV plus assay is intended as an aid in the diagnosis of influenza from Nasopharyngeal swab specimens and should not be used as a sole basis for treatment. Nasal washings and aspirates are unacceptable for Xpert Xpress SARS-CoV-2/FLU/RSV testing.  Fact Sheet for Patients: BloggerCourse.com  Fact Sheet for Healthcare Providers: SeriousBroker.it  This test is not yet approved or cleared by the [place] FDA and has been authorized for detection and/or diagnosis of  SARS-CoV-2 by FDA under an Emergency Use Authorization (EUA). This EUA will remain in effect (meaning this test can be used) for the duration of the COVID-19 declaration under Section 564(b)(1) of the Act, 21 U.S.C. section 360bbb-3(b)(1), unless the authorization is terminated or revoked.  Performed at [facility], [address] [place], [place] [address]    Opiates 07/23/2021 NONE DETECTED  NONE DETECTED Final   Cocaine 07/23/2021 NONE DETECTED  NONE DETECTED Final   Benzodiazepines 07/23/2021 NONE DETECTED  NONE DETECTED Final   Amphetamines 07/23/2021 NONE DETECTED  NONE DETECTED Final   Tetrahydrocannabinol 07/23/2021 NONE DETECTED  NONE DETECTED Final   Barbiturates 07/23/2021 NONE DETECTED  NONE DETECTED Final   Comment: (NOTE) DRUG SCREEN FOR MEDICAL PURPOSES ONLY.  IF CONFIRMATION IS NEEDED FOR ANY PURPOSE, NOTIFY LAB WITHIN 5 DAYS.  LOWEST DETECTABLE LIMITS FOR URINE DRUG SCREEN Drug Class                     Cutoff  (ng/mL) Amphetamine and metabolites    1000 Barbiturate and metabolites    200 Benzodiazepine                 200 Tricyclics and metabolites     300 Opiates and metabolites        300 Cocaine and metabolites        300 THC                            50 Performed at [facility], [address] [place], [place] [address]   Admission on 05/04/2021, Discharged on 05/07/2021  Component Date Value Ref Range Status   SARS Coronavirus 2 by RT PCR 05/05/2021 NEGATIVE  NEGATIVE Final   Comment: (NOTE) SARS-CoV-2 target nucleic acids are NOT DETECTED.  The SARS-CoV-2 RNA is generally detectable in upper respiratory specimens during the acute phase of infection. The lowest concentration of SARS-CoV-2 viral copies this assay can detect is 138 copies/mL. A negative result does not preclude SARS-Cov-2 infection and should not be used as the sole basis for treatment or other patient management decisions. A negative result may occur with  improper specimen collection/handling, submission of specimen other than nasopharyngeal swab, presence of viral mutation(s) within the areas targeted by this assay, and inadequate number of viral copies(<138 copies/mL). A negative result must be combined with clinical observations, patient history, and epidemiological information. The expected result is Negative.  Fact Sheet for Patients:  BloggerCourse.com  Fact Sheet for Healthcare Providers:  SeriousBroker.it  This test is no                          t yet approved or cleared by the [place] FDA and  has been authorized for detection and/or diagnosis of SARS-CoV-2 by FDA under an Emergency Use Authorization (EUA). This EUA will remain  in effect (meaning this test can be used) for the duration of the COVID-19 declaration under Section 564(b)(1) of the Act, 21 U.S.C.section 360bbb-3(b)(1), unless the authorization is terminated   or revoked sooner.       Influenza A by PCR 05/05/2021 NEGATIVE  NEGATIVE Final   Influenza B by PCR 05/05/2021 NEGATIVE  NEGATIVE Final   Comment: (NOTE) The Xpert Xpress SARS-CoV-2/FLU/RSV plus assay is intended as an aid in the diagnosis of influenza from Nasopharyngeal swab specimens and should not be used as a sole basis  for treatment. Nasal washings and aspirates are unacceptable for Xpert Xpress SARS-CoV-2/FLU/RSV testing.  Fact Sheet for Patients: BloggerCourse.com  Fact Sheet for Healthcare Providers: SeriousBroker.it  This test is not yet approved or cleared by the [place] FDA and has been authorized for detection and/or diagnosis of SARS-CoV-2 by FDA under an Emergency Use Authorization (EUA). This EUA will remain in effect (meaning this test can be used) for the duration of the COVID-19 declaration under Section 564(b)(1) of the Act, 21 U.S.C. section 360bbb-3(b)(1), unless the authorization is terminated or revoked.  Performed at [facility], [address], [place], [place] [address]    WBC 05/05/2021 6.8  4.0 - 10.5 K/uL Final   RBC 05/05/2021 4.28  3.87 - 5.11 MIL/uL Final   Hemoglobin 05/05/2021 13.1  12.0 - 15.0 g/dL Final   HCT 02/16/1600 40.2  36.0 - 46.0 % Final   MCV 05/05/2021 93.9  80.0 - 100.0 fL Final   MCH 05/05/2021 30.6  26.0 - 34.0 pg Final   MCHC 05/05/2021 32.6  30.0 - 36.0 g/dL Final   RDW 09/32/3557 12.1  11.5 - 15.5 % Final   Platelets 05/05/2021 342  150 - 400 K/uL Final   nRBC 05/05/2021 0.0  0.0 - 0.2 % Final   Neutrophils Relative % 05/05/2021 45  % Final   Neutro Abs 05/05/2021 3.1  1.7 - 7.7 K/uL Final   Lymphocytes Relative 05/05/2021 47  % Final   Lymphs Abs 05/05/2021 3.1  0.7 - 4.0 K/uL Final   Monocytes Relative 05/05/2021 5  % Final   Monocytes Absolute 05/05/2021 0.4  0.1 - 1.0 K/uL Final   Eosinophils Relative 05/05/2021 3  % Final   Eosinophils Absolute  05/05/2021 0.2  0.0 - 0.5 K/uL Final   Basophils Relative 05/05/2021 0  % Final   Basophils Absolute 05/05/2021 0.0  0.0 - 0.1 K/uL Final   Immature Granulocytes 05/05/2021 0  % Final   Abs Immature Granulocytes 05/05/2021 0.02  0.00 - 0.07 K/uL Final   Performed at [facility], [address], [place], [place] [address]   Sodium 05/05/2021 136  135 - 145 mmol/L Final   Potassium 05/05/2021 3.6  3.5 - 5.1 mmol/L Final   Chloride 05/05/2021 107  98 - 111 mmol/L Final   CO2 05/05/2021 22  22 - 32 mmol/L Final   Glucose, Bld 05/05/2021 83  70 - 99 mg/dL Final   Glucose reference range applies only to samples taken after fasting for at least 8 hours.   BUN 05/05/2021 7  6 - 20 mg/dL Final   Creatinine, Ser 05/05/2021 0.69  0.44 - 1.00 mg/dL Final   Calcium 54/27/0623 9.3  8.9 - 10.3 mg/dL Final   Total Protein 76/28/3151 6.8  6.5 - 8.1 g/dL Final   Albumin 76/16/0737 3.8  3.5 - 5.0 g/dL Final   AST 10/62/6948 17  15 - 41 U/L Final   ALT 05/05/2021 17  0 - 44 U/L Final   Alkaline Phosphatase 05/05/2021 51  38 - 126 U/L Final   Total Bilirubin 05/05/2021 1.3 (H)  0.3 - 1.2 mg/dL Final   GFR, Estimated 05/05/2021 >60  >60 mL/min Final   Comment: (NOTE) Calculated using the CKD-EPI Creatinine Equation (2021)    Anion gap 05/05/2021 7  5 - 15 Final   Performed at [facility], [address], [place], [place] [address]   Hgb A1c MFr Bld 05/05/2021 4.5 (L)  4.8 -  5.6 % Final   Comment: (NOTE) Pre diabetes:          5.7%-6.4%  Diabetes:              >6.4%  Glycemic control for   <7.0% adults with diabetes    Mean Plasma Glucose 05/05/2021 82.45  mg/dL Final   Performed at [facility], [address], [place], [place] [address]   Cholesterol 05/05/2021 121  0 - 200 mg/dL Final   Triglycerides 60/45/4098 57  <150 mg/dL Final   HDL 11/91/4782 52  >40 mg/dL Final   Total CHOL/HDL Ratio 05/05/2021 2.3  RATIO Final   VLDL 05/05/2021 11  0 - 40 mg/dL Final   LDL  Cholesterol 05/05/2021 58  0 - 99 mg/dL Final   Comment:        Total Cholesterol/HDL:CHD Risk Coronary Heart Disease Risk Table                     Men   Women  1/2 Average Risk   3.4   3.3  Average Risk       5.0   4.4  2 X Average Risk   9.6   7.1  3 X Average Risk  23.4   11.0        Use the calculated Patient Ratio above and the CHD Risk Table to determine the patient's CHD Risk.        ATP III CLASSIFICATION (LDL):  <100     mg/dL   Optimal  956-213  mg/dL   Near or Above                    Optimal  130-159  mg/dL   Borderline  086-578  mg/dL   High  >469     mg/dL   Very High Performed at [facility], [address] [place], [place] [address]    TSH 05/05/2021 2.727  0.350 - 4.500 uIU/mL Final   Comment: Performed by a 3rd Generation assay with a functional sensitivity of <=0.01 uIU/mL. Performed at [facility], [address], [place], [place] [address]    SARS Coronavirus 2 Ag 05/05/2021 Negative  Negative Final   POC Amphetamine UR 05/05/2021 None Detected  NONE DETECTED (Cut Off Level 1000 ng/mL) Final   POC Secobarbital (BAR) 05/05/2021 None Detected  NONE DETECTED (Cut Off Level 300 ng/mL) Final   POC Buprenorphine (BUP) 05/05/2021 None Detected  NONE DETECTED (Cut Off Level 10 ng/mL) Final   POC Oxazepam (BZO) 05/05/2021 None Detected  NONE DETECTED (Cut Off Level 300 ng/mL) Final   POC Cocaine UR 05/05/2021 None Detected  NONE DETECTED (Cut Off Level 300 ng/mL) Final   POC Methamphetamine UR 05/05/2021 None Detected  NONE DETECTED (Cut Off Level 1000 ng/mL) Final   POC Morphine 05/05/2021 None Detected  NONE DETECTED (Cut Off Level 300 ng/mL) Final   POC Oxycodone UR 05/05/2021 None Detected  NONE DETECTED (Cut Off Level 100 ng/mL) Final   POC Methadone UR 05/05/2021 None Detected  NONE DETECTED (Cut Off Level 300 ng/mL) Final   POC Marijuana UR 05/05/2021 None Detected  NONE DETECTED (Cut Off Level 50 ng/mL) Final   Preg Test, Ur 05/05/2021  NEGATIVE  NEGATIVE Final   Comment:        THE SENSITIVITY OF THIS METHODOLOGY IS >24 mIU/mL    SARSCOV2ONAVIRUS 2 AG 05/05/2021 NEGATIVE  NEGATIVE Final   Comment: (NOTE) SARS-CoV-2 antigen  NOT DETECTED.   Negative results are presumptive.  Negative results do not preclude SARS-CoV-2 infection and should not be used as the sole basis for treatment or other patient management decisions, including infection  control decisions, particularly in the presence of clinical signs and  symptoms consistent with COVID-19, or in those who have been in contact with the virus.  Negative results must be combined with clinical observations, patient history, and epidemiological information. The expected result is Negative.  Fact Sheet for Patients: https://www.jennings-kim.com/  Fact Sheet for Healthcare Providers: https://alexander-rogers.biz/  This test is not yet approved or cleared by the [place] FDA and  has been authorized for detection and/or diagnosis of SARS-CoV-2 by FDA under an Emergency Use Authorization (EUA).  This EUA will remain in effect (meaning this test can be used) for the duration of  the COV                          ID-19 declaration under Section 564(b)(1) of the Act, 21 U.S.C. section 360bbb-3(b)(1), unless the authorization is terminated or revoked sooner.    Orders Only on 04/25/2021  Component Date Value Ref Range Status   hCG,Beta Subunit,Qual,Serum 04/25/2021 Negative  Negative <6 mIU/mL Final  Clinical Support on 02/20/2021  Component Date Value Ref Range Status   Preg Test, Ur 02/20/2021 Negative  Negative Final    Allergies: Patient has no known allergies.  PTA Medications: (Not in a hospital admission)   Medical Decision Making  20 year old female with history of MDD and anxiety who presented to the [facility], ED on 12/5 for flulike symptoms and suicidal thoughts.  S patient was unable to contract for safety after being  medically cleared and was transferred to the [facility] on 12/6 for further treatment. UDS neg, labs unremarkable.   Patient denies SI/HI/AVH on assessment today; however, she reports feeling depressed and numerous depressive symptoms.  Patient remains appropriate for treatment at the [facility] at this time.  Patient is agreeable to starting newly increased dose of Zoloft by her outpatient provider today.  MDD Anxiety -increase zoloft to 50  mg.  Will titrate as clinically indicated/appropriate -continue seroquel 20 mg as adjunct for mood -prn vistaril available for anxiety  Dispo: ongoing. SW to be consulted for assistance.      Recommendations  Based on my evaluation the patient does not appear to have an emergency medical condition.  Estella Husk, MD 07/24/21  4:21 PM

## 2021-07-25 NOTE — Clinical Social Work Psych Note (Signed)
CSW Initial Note  CSW met with Arlette for introduction and to begin discussion regarding treatment and potential discharge planning.  Ameisha presented with a dysphoric affect, congruent mood. She remained in bed while speaking with this writer.   Kaleea reports she came to the facility seeking help with her ongoing, worsening depressive and anxiety symptoms.  Eastyn shared she felt overwhelmed with school, work and coping with her ongoing depressive and anxiety symptoms. Nefertiti shared that she is currently a full-time student at [employer]. She also reports working 40 hours a week at [employer].   Annalina reports she has experienced racing thoughts this morning. She states that she continues to worry about her upcoming finals, and requested CSW to reach out to her Dean of Students to notify professors of hospitalization.   Lajuana reports her goal is to have her symptoms stabilized, so she can return to work and school. She is hopeful that she will continue to learn improved coping mechanisms she can utilize moving forward.   Magdaline reports she would like her mother to be "more" supportive, and wants more"personal space" from her family, whom she continues to live with. Omya states "I feel like no one talks to me about my feelings. They just watch me to make sure I am safe".    Timisha reports she recently participated in IOP previously and it shared that is was helpful. CSW informed Jadalynn that PHP was a higher level of intensive outpatient services, and that it could be beneficial. Salinda shared that she is interested in PHP, however does not think hours align with current school and work schedule. She reports she is receptive to receiving resources for her to follow up.   Austen reports in the meantime, she plans to continue to follow up with her current providers at [facility] for outpatient medication management and therapy services.   CSW contacted [employer]'s Dean of [employer] ([phone])  department to notify of her the patient's admission to the [facility]. According to receptionist, the department is aware and will notify her professors via e-mail. They report the patient will still need to follow up with each professor, following her discharge from this facility.  CSW will continue to follow.     , MSW, LCSW Clinical Social Worker (Facility Based Crisis) [facility]   

## 2021-07-25 NOTE — ED Notes (Signed)
Pt is currently sleeping, no distress noted, environmental check complete, will continue to monitor patient for safety. ? ?

## 2021-07-25 NOTE — ED Notes (Signed)
Patient resting with eyes shut - no sxs of distress at this time - will continue to monitor for safety

## 2021-07-25 NOTE — Progress Notes (Signed)
Pt is asleep. No distress noted. Monitoring for safety.

## 2021-07-25 NOTE — ED Notes (Signed)
This nurse spoke with patient while administering medications. When asked if patient is still having SI the patient said a little bit. However she agreed if the thought got worse she would come talk to me. The patient states she is not having any HI/AVH

## 2021-07-25 NOTE — Progress Notes (Signed)
Pt is watching TV quietly. No distress or discomfort noted. Pt's safety is maintained.

## 2021-07-25 NOTE — Progress Notes (Signed)
Pt is asleep. Respirations are even and unlabored. No signs of acute distress noted. Staff will monitor for pt's safety. 

## 2021-07-25 NOTE — Progress Notes (Signed)
Pt is awake, alert and oriented with flat affect. Pt did not voice any complaints of pain or discomfort. Pt endorses passive SI. Pt verbally contracts for safety on the unit. Pt reported of intrusive thoughts. Pt agreed to inform staff if thoughts get overwhelming or have an intent to act on thoughts. Pt denies HI. Administered med with no incident. Staff will monitor for pt's safety.

## 2021-07-25 NOTE — ED Notes (Signed)
Pt is currently in dining room attending groups.

## 2021-07-25 NOTE — ED Notes (Signed)
Pt is sleeping in the bed. Respirations are even and unlabored. No acute distress noted. Will continue to monitor for safety. °

## 2021-07-25 NOTE — ED Provider Notes (Signed)
Behavioral Health Progress Note  Date and Time: 07/25/2021 3:02 PM Name: Noleen Bulick [name] MRN:  [id number]  Subjective:   20 year old female with history of MDD and anxiety who presented to the [facility] ED on 12/5 for flulike symptoms and suicidal thoughts.   patient was unable to contract for safety after being medically cleared and was transferred to the [facility] on 12/6 for further treatment.   Patient seen and chart review. She has been medication compliant, been appropriate with staff and peers on the unit. Patient interviewed in her room this morning , she describes her mood as "not so good" and rates her mood 2/10. She states that she slept "ok" but describes having several night time awakenings where it is difficult to go back to sleep d/t anxiety related to school and work. Patient states that she has occasional SI and thought about cutting herself last night but states "I can't find it here". She describes engaging in NSSIB at times. She denies HI/AVH. She states that she spoke to her mother and her sister while she has been at the [facility] and states that her mother is contacting her school to let them know that she is in the hospital and that her sister contacted her work place. Patient raises concern that she may lose her job at [employer] due to absences. Discussed referral to [place] with patient; she expresses interest and states that she discussed it with SW but raises concern about her ability to attend due to school and work. Informed patient that [place] program has SW who typically assists in [facility] paperwork if deemed necessary. Patient is future oriented and is focused on contacting her school and work place to help coordinate after discharge plans. She denies all physical complaints apart from back pain. Pt shares today that her sister has a h/o bipolar disorder.   Diagnosis:  Final diagnoses:  MDD (major depressive disorder), severe ([place])    Total Time spent with patient: 20  minutes  Past Psychiatric History: MDD with psychosis, MDD, anxiety, reported h/o ?schizophrenia Past Medical History:  Past Medical History:  Diagnosis Date   Anxiety    Depression    Medical history non-contributory    Vision abnormalities     Past Surgical History:  Procedure Laterality Date   WISDOM TOOTH EXTRACTION     Family History:  Family History  Problem Relation Age of Onset   Rheum arthritis Mother    Lupus Mother    Obesity Mother    Hypertension Mother    Kidney disease Mother    Healthy Sister    Breast cancer Maternal Grandmother    Cancer Maternal Grandmother        breast cancer   Diabetes Maternal Grandmother    Hypertension Maternal Grandmother    Hyperlipidemia Maternal Grandmother    Breast cancer Cousin    Diabetes Father    Family Psychiatric  History:  Social History: Uncle with completed suicide; sister with bipolar disorder   Social History   Substance and Sexual Activity  Alcohol Use Yes   Comment: Rare     Social History   Substance and Sexual Activity  Drug Use Yes   Frequency: 3.0 times per week   Types: Marijuana    Social History   Socioeconomic History   Marital status: Single    Spouse name: Not on file   Number of children: Not on file   Years of education: Not on file   Highest education level: Not on  file  Occupational History   Not on file  Tobacco Use   Smoking status: Never   Smokeless tobacco: Never  Vaping Use   Vaping Use: Never used  Substance and Sexual Activity   Alcohol use: Yes    Comment: Rare   Drug use: Yes    Frequency: 3.0 times per week    Types: Marijuana   Sexual activity: Not Currently    Birth control/protection: Injection    Comment: 1st intercourse- 16, partners- 2,   Other Topics Concern   Not on file  Social History Narrative   Not on file   Social Determinants of Health   Financial Resource Strain: Not on file  Food Insecurity: Not on file  Transportation Needs: Not on  file  Physical Activity: Not on file  Stress: Not on file  Social Connections: Not on file   SDOH:  SDOH Screenings   Alcohol Screen: Not on file  Depression (PHQ2-9): Medium Risk   PHQ-2 Score: 16  Financial Resource Strain: Not on file  Food Insecurity: Not on file  Housing: Not on file  Physical Activity: Not on file  Social Connections: Not on file  Stress: Not on file  Tobacco Use: Low Risk    Smoking Tobacco Use: Never   Smokeless Tobacco Use: Never   Passive Exposure: Not on file  Transportation Needs: Not on file   Additional Social History:                         Sleep: Fair  Appetite:  Fair  Current Medications:  Current Facility-Administered Medications  Medication Dose Route Frequency Provider Last Rate Last Admin   acetaminophen (TYLENOL) tablet 650 mg  650 mg Oral Q6H PRN Ival Bible, MD       alum & mag hydroxide-simeth (MAALOX/MYLANTA) 200-200-20 MG/5ML suspension 30 mL  30 mL Oral Q4H PRN Ival Bible, MD       hydrOXYzine (ATARAX) tablet 25 mg  25 mg Oral TID PRN Ival Bible, MD       magnesium hydroxide (MILK OF MAGNESIA) suspension 30 mL  30 mL Oral Daily PRN Ival Bible, MD       QUEtiapine (SEROQUEL) tablet 25 mg  25 mg Oral QHS Ival Bible, MD       sertraline (ZOLOFT) tablet 50 mg  50 mg Oral Daily Ival Bible, MD   50 mg at 07/25/21 0932   traZODone (DESYREL) tablet 50 mg  50 mg Oral QHS PRN Ival Bible, MD       Current Outpatient Medications  Medication Sig Dispense Refill   QUEtiapine (SEROQUEL) 50 MG tablet Take 50 mg by mouth at bedtime.     sertraline (ZOLOFT) 50 MG tablet Take 1 tablet (50 mg total) by mouth daily. 7 tablet 0    Labs  Lab Results:  Admission on 07/23/2021, Discharged on 07/24/2021  Component Date Value Ref Range Status   Sodium 07/23/2021 136  135 - 145 mmol/L Final   Potassium 07/23/2021 3.7  3.5 - 5.1 mmol/L Final   Chloride 07/23/2021 104   98 - 111 mmol/L Final   CO2 07/23/2021 24  22 - 32 mmol/L Final   Glucose, Bld 07/23/2021 89  70 - 99 mg/dL Final   Glucose reference range applies only to samples taken after fasting for at least 8 hours.   BUN 07/23/2021 10  6 - 20 mg/dL Final   Creatinine, Ser  07/23/2021 0.76  0.44 - 1.00 mg/dL Final   Calcium 00/17/4944 9.3  8.9 - 10.3 mg/dL Final   Total Protein 96/75/9163 7.5  6.5 - 8.1 g/dL Final   Albumin 84/66/5993 4.3  3.5 - 5.0 g/dL Final   AST 57/08/7791 16  15 - 41 U/L Final   ALT 07/23/2021 15  0 - 44 U/L Final   Alkaline Phosphatase 07/23/2021 51  38 - 126 U/L Final   Total Bilirubin 07/23/2021 1.7 (H)  0.3 - 1.2 mg/dL Final   GFR, Estimated 07/23/2021 >60  >60 mL/min Final   Comment: (NOTE) Calculated using the CKD-EPI Creatinine Equation (2021)    Anion gap 07/23/2021 8  5 - 15 Final   Performed at [facility], [address] [place], [place] [address]   WBC 07/23/2021 8.4  4.0 - 10.5 K/uL Final   RBC 07/23/2021 4.51  3.87 - 5.11 MIL/uL Final   Hemoglobin 07/23/2021 13.9  12.0 - 15.0 g/dL Final   HCT 92/33/0076 43.3  36.0 - 46.0 % Final   MCV 07/23/2021 96.0  80.0 - 100.0 fL Final   MCH 07/23/2021 30.8  26.0 - 34.0 pg Final   MCHC 07/23/2021 32.1  30.0 - 36.0 g/dL Final   RDW 22/63/3354 12.3  11.5 - 15.5 % Final   Platelets 07/23/2021 352  150 - 400 K/uL Final   nRBC 07/23/2021 0.0  0.0 - 0.2 % Final   Neutrophils Relative % 07/23/2021 53  % Final   Neutro Abs 07/23/2021 4.5  1.7 - 7.7 K/uL Final   Lymphocytes Relative 07/23/2021 40  % Final   Lymphs Abs 07/23/2021 3.4  0.7 - 4.0 K/uL Final   Monocytes Relative 07/23/2021 5  % Final   Monocytes Absolute 07/23/2021 0.4  0.1 - 1.0 K/uL Final   Eosinophils Relative 07/23/2021 1  % Final   Eosinophils Absolute 07/23/2021 0.1  0.0 - 0.5 K/uL Final   Basophils Relative 07/23/2021 1  % Final   Basophils Absolute 07/23/2021 0.0  0.0 - 0.1 K/uL Final   Immature Granulocytes 07/23/2021 0  % Final    Abs Immature Granulocytes 07/23/2021 0.02  0.00 - 0.07 K/uL Final   Performed at [facility], [address], [place], [place] [address]   Acetaminophen (Tylenol), Serum 07/23/2021 <10 (L)  10 - 30 ug/mL Final   Comment: (NOTE) Therapeutic concentrations vary significantly. A range of 10-30 ug/mL  may be an effective concentration for many patients. However, some  are best treated at concentrations outside of this range. Acetaminophen concentrations >150 ug/mL at 4 hours after ingestion  and >50 ug/mL at 12 hours after ingestion are often associated with  toxic reactions.  Performed at [facility], [address], [place], [place] [address]    Salicylate Lvl 07/23/2021 <7.0 (L)  7.0 - 30.0 mg/dL Final   Performed at [facility], [address], [place], [place] [address]   I-stat hCG, quantitative 07/23/2021 <5.0  <5 mIU/mL Final   Comment 3 07/23/2021          Final   Comment:   GEST. AGE      CONC.  (mIU/mL)   <=1 WEEK        5 - 50     2 WEEKS       50 - 500     3 WEEKS       100 - 10,000     4 WEEKS  1,000 - 30,000        FEMALE AND NON-PREGNANT FEMALE:     LESS THAN 5 mIU/mL    SARS Coronavirus 2 by RT PCR 07/23/2021 NEGATIVE  NEGATIVE Final   Comment: (NOTE) SARS-CoV-2 target nucleic acids are NOT DETECTED.  The SARS-CoV-2 RNA is generally detectable in upper respiratory specimens during the acute phase of infection. The lowest concentration of SARS-CoV-2 viral copies this assay can detect is 138 copies/mL. A negative result does not preclude SARS-Cov-2 infection and should not be used as the sole basis for treatment or other patient management decisions. A negative result may occur with  improper specimen collection/handling, submission of specimen other than nasopharyngeal swab, presence of viral mutation(s) within the areas targeted by this assay, and inadequate number of viral copies(<138 copies/mL). A  negative result must be combined with clinical observations, patient history, and epidemiological information. The expected result is Negative.  Fact Sheet for Patients:  EntrepreneurPulse.com.au  Fact Sheet for Healthcare Providers:  IncredibleEmployment.be  This test is no                          t yet approved or cleared by the [place] FDA and  has been authorized for detection and/or diagnosis of SARS-CoV-2 by FDA under an Emergency Use Authorization (EUA). This EUA will remain  in effect (meaning this test can be used) for the duration of the COVID-19 declaration under Section 564(b)(1) of the Act, 21 U.S.C.section 360bbb-3(b)(1), unless the authorization is terminated  or revoked sooner.       Influenza A by PCR 07/23/2021 NEGATIVE  NEGATIVE Final   Influenza B by PCR 07/23/2021 NEGATIVE  NEGATIVE Final   Comment: (NOTE) The Xpert Xpress SARS-CoV-2/FLU/RSV plus assay is intended as an aid in the diagnosis of influenza from Nasopharyngeal swab specimens and should not be used as a sole basis for treatment. Nasal washings and aspirates are unacceptable for Xpert Xpress SARS-CoV-2/FLU/RSV testing.  Fact Sheet for Patients: EntrepreneurPulse.com.au  Fact Sheet for Healthcare Providers: IncredibleEmployment.be  This test is not yet approved or cleared by the [place] FDA and has been authorized for detection and/or diagnosis of SARS-CoV-2 by FDA under an Emergency Use Authorization (EUA). This EUA will remain in effect (meaning this test can be used) for the duration of the COVID-19 declaration under Section 564(b)(1) of the Act, 21 U.S.C. section 360bbb-3(b)(1), unless the authorization is terminated or revoked.  Performed at [facility], [place] [address] [place], [place] [address]    Opiates 07/23/2021 NONE DETECTED  NONE DETECTED Final   Cocaine 07/23/2021 NONE  DETECTED  NONE DETECTED Final   Benzodiazepines 07/23/2021 NONE DETECTED  NONE DETECTED Final   Amphetamines 07/23/2021 NONE DETECTED  NONE DETECTED Final   Tetrahydrocannabinol 07/23/2021 NONE DETECTED  NONE DETECTED Final   Barbiturates 07/23/2021 NONE DETECTED  NONE DETECTED Final   Comment: (NOTE) DRUG SCREEN FOR MEDICAL PURPOSES ONLY.  IF CONFIRMATION IS NEEDED FOR ANY PURPOSE, NOTIFY LAB WITHIN 5 DAYS.  LOWEST DETECTABLE LIMITS FOR URINE DRUG SCREEN Drug Class                     Cutoff (ng/mL) Amphetamine and metabolites    1000 Barbiturate and metabolites    200 Benzodiazepine                 [id number] Tricyclics and metabolites     300 Opiates and metabolites  300 Cocaine and metabolites        300 THC                            50 Performed at [facility], [place] [address], [place], [place] [address]   Admission on 05/04/2021, Discharged on 05/07/2021  Component Date Value Ref Range Status   SARS Coronavirus 2 by RT PCR 05/05/2021 NEGATIVE  NEGATIVE Final   Comment: (NOTE) SARS-CoV-2 target nucleic acids are NOT DETECTED.  The SARS-CoV-2 RNA is generally detectable in upper respiratory specimens during the acute phase of infection. The lowest concentration of SARS-CoV-2 viral copies this assay can detect is 138 copies/mL. A negative result does not preclude SARS-Cov-2 infection and should not be used as the sole basis for treatment or other patient management decisions. A negative result may occur with  improper specimen collection/handling, submission of specimen other than nasopharyngeal swab, presence of viral mutation(s) within the areas targeted by this assay, and inadequate number of viral copies(<138 copies/mL). A negative result must be combined with clinical observations, patient history, and epidemiological information. The expected result is Negative.  Fact Sheet for Patients:  EntrepreneurPulse.com.au  Fact Sheet  for Healthcare Providers:  IncredibleEmployment.be  This test is no                          t yet approved or cleared by the [place] FDA and  has been authorized for detection and/or diagnosis of SARS-CoV-2 by FDA under an Emergency Use Authorization (EUA). This EUA will remain  in effect (meaning this test can be used) for the duration of the COVID-19 declaration under Section 564(b)(1) of the Act, 21 U.S.C.section 360bbb-3(b)(1), unless the authorization is terminated  or revoked sooner.       Influenza A by PCR 05/05/2021 NEGATIVE  NEGATIVE Final   Influenza B by PCR 05/05/2021 NEGATIVE  NEGATIVE Final   Comment: (NOTE) The Xpert Xpress SARS-CoV-2/FLU/RSV plus assay is intended as an aid in the diagnosis of influenza from Nasopharyngeal swab specimens and should not be used as a sole basis for treatment. Nasal washings and aspirates are unacceptable for Xpert Xpress SARS-CoV-2/FLU/RSV testing.  Fact Sheet for Patients: EntrepreneurPulse.com.au  Fact Sheet for Healthcare Providers: IncredibleEmployment.be  This test is not yet approved or cleared by the [place] FDA and has been authorized for detection and/or diagnosis of SARS-CoV-2 by FDA under an Emergency Use Authorization (EUA). This EUA will remain in effect (meaning this test can be used) for the duration of the COVID-19 declaration under Section 564(b)(1) of the Act, 21 U.S.C. section 360bbb-3(b)(1), unless the authorization is terminated or revoked.  Performed at [facility], [place] [address] [place], [place] [address]    WBC 05/05/2021 6.8  4.0 - 10.5 K/uL Final   RBC 05/05/2021 4.28  3.87 - 5.11 MIL/uL Final   Hemoglobin 05/05/2021 13.1  12.0 - 15.0 g/dL Final   HCT 05/05/2021 40.2  36.0 - 46.0 % Final   MCV 05/05/2021 93.9  80.0 - 100.0 fL Final   MCH 05/05/2021 30.6  26.0 - 34.0 pg Final   MCHC 05/05/2021 32.6  30.0 - 36.0 g/dL Final    RDW 05/05/2021 12.1  11.5 - 15.5 % Final   Platelets 05/05/2021 342  150 - 400 K/uL Final   nRBC 05/05/2021 0.0  0.0 - 0.2 % Final   Neutrophils Relative % 05/05/2021  45  % Final   Neutro Abs 05/05/2021 3.1  1.7 - 7.7 K/uL Final   Lymphocytes Relative 05/05/2021 47  % Final   Lymphs Abs 05/05/2021 3.1  0.7 - 4.0 K/uL Final   Monocytes Relative 05/05/2021 5  % Final   Monocytes Absolute 05/05/2021 0.4  0.1 - 1.0 K/uL Final   Eosinophils Relative 05/05/2021 3  % Final   Eosinophils Absolute 05/05/2021 0.2  0.0 - 0.5 K/uL Final   Basophils Relative 05/05/2021 0  % Final   Basophils Absolute 05/05/2021 0.0  0.0 - 0.1 K/uL Final   Immature Granulocytes 05/05/2021 0  % Final   Abs Immature Granulocytes 05/05/2021 0.02  0.00 - 0.07 K/uL Final   Performed at [facility], [place] [address], [place], [place] [address]   Sodium 05/05/2021 136  135 - 145 mmol/L Final   Potassium 05/05/2021 3.6  3.5 - 5.1 mmol/L Final   Chloride 05/05/2021 107  98 - 111 mmol/L Final   CO2 05/05/2021 22  22 - 32 mmol/L Final   Glucose, Bld 05/05/2021 83  70 - 99 mg/dL Final   Glucose reference range applies only to samples taken after fasting for at least 8 hours.   BUN 05/05/2021 7  6 - 20 mg/dL Final   Creatinine, Ser 05/05/2021 0.69  0.44 - 1.00 mg/dL Final   Calcium 05/05/2021 9.3  8.9 - 10.3 mg/dL Final   Total Protein 05/05/2021 6.8  6.5 - 8.1 g/dL Final   Albumin 05/05/2021 3.8  3.5 - 5.0 g/dL Final   AST 05/05/2021 17  15 - 41 U/L Final   ALT 05/05/2021 17  0 - 44 U/L Final   Alkaline Phosphatase 05/05/2021 51  38 - 126 U/L Final   Total Bilirubin 05/05/2021 1.3 (H)  0.3 - 1.2 mg/dL Final   GFR, Estimated 05/05/2021 >60  >60 mL/min Final   Comment: (NOTE) Calculated using the CKD-EPI Creatinine Equation (2021)    Anion gap 05/05/2021 7  5 - 15 Final   Performed at [place] [address], [place], [place] [address]   Hgb A1c MFr Bld 05/05/2021 4.5 (L)  4.8 - 5.6 % Final   Comment:  (NOTE) Pre diabetes:          5.7%-6.4%  Diabetes:              >6.4%  Glycemic control for   <7.0% adults with diabetes    Mean Plasma Glucose 05/05/2021 82.45  mg/dL Final   Performed at [facility], [place] [address], [place], [place] [address]   Cholesterol 05/05/2021 121  0 - 200 mg/dL Final   Triglycerides 05/05/2021 57  <150 mg/dL Final   HDL 05/05/2021 52  >40 mg/dL Final   Total CHOL/HDL Ratio 05/05/2021 2.3  RATIO Final   VLDL 05/05/2021 11  0 - 40 mg/dL Final   LDL Cholesterol 05/05/2021 58  0 - 99 mg/dL Final   Comment:        Total Cholesterol/HDL:CHD Risk Coronary Heart Disease Risk Table                     Men   Women  1/2 Average Risk   3.4   3.3  Average Risk       5.0   4.4  2 X Average Risk   9.6   7.1  3 X Average Risk  23.4   11.0  Use the calculated Patient Ratio above and the CHD Risk Table to determine the patient's CHD Risk.        ATP III CLASSIFICATION (LDL):  <100     mg/dL   Optimal  100-129  mg/dL   Near or Above                    Optimal  130-159  mg/dL   Borderline  160-189  mg/dL   High  >190     mg/dL   Very High Performed at [place] [address], [place], [place] [address]    TSH 05/05/2021 2.727  0.350 - 4.500 uIU/mL Final   Comment: Performed by a 3rd Generation assay with a functional sensitivity of <=0.01 uIU/mL. Performed at [facility], [place] [address], [place], [place] [address]    SARS Coronavirus 2 Ag 05/05/2021 Negative  Negative Final   POC Amphetamine UR 05/05/2021 None Detected  NONE DETECTED (Cut Off Level 1000 ng/mL) Final   POC Secobarbital (BAR) 05/05/2021 None Detected  NONE DETECTED (Cut Off Level 300 ng/mL) Final   POC Buprenorphine (BUP) 05/05/2021 None Detected  NONE DETECTED (Cut Off Level 10 ng/mL) Final   POC Oxazepam (BZO) 05/05/2021 None Detected  NONE DETECTED (Cut Off Level 300 ng/mL) Final   POC Cocaine UR 05/05/2021 None Detected  NONE DETECTED (Cut Off Level 300 ng/mL)  Final   POC Methamphetamine UR 05/05/2021 None Detected  NONE DETECTED (Cut Off Level 1000 ng/mL) Final   POC Morphine 05/05/2021 None Detected  NONE DETECTED (Cut Off Level 300 ng/mL) Final   POC Oxycodone UR 05/05/2021 None Detected  NONE DETECTED (Cut Off Level 100 ng/mL) Final   POC Methadone UR 05/05/2021 None Detected  NONE DETECTED (Cut Off Level 300 ng/mL) Final   POC Marijuana UR 05/05/2021 None Detected  NONE DETECTED (Cut Off Level 50 ng/mL) Final   Preg Test, Ur 05/05/2021 NEGATIVE  NEGATIVE Final   Comment:        THE SENSITIVITY OF THIS METHODOLOGY IS >24 mIU/mL    SARSCOV2ONAVIRUS 2 AG 05/05/2021 NEGATIVE  NEGATIVE Final   Comment: (NOTE) SARS-CoV-2 antigen NOT DETECTED.   Negative results are presumptive.  Negative results do not preclude SARS-CoV-2 infection and should not be used as the sole basis for treatment or other patient management decisions, including infection  control decisions, particularly in the presence of clinical signs and  symptoms consistent with COVID-19, or in those who have been in contact with the virus.  Negative results must be combined with clinical observations, patient history, and epidemiological information. The expected result is Negative.  Fact Sheet for Patients: HandmadeRecipes.com.cy  Fact Sheet for Healthcare Providers: FuneralLife.at  This test is not yet approved or cleared by the [place] FDA and  has been authorized for detection and/or diagnosis of SARS-CoV-2 by FDA under an Emergency Use Authorization (EUA).  This EUA will remain in effect (meaning this test can be used) for the duration of  the COV                          ID-19 declaration under Section 564(b)(1) of the Act, 21 U.S.C. section 360bbb-3(b)(1), unless the authorization is terminated or revoked sooner.    Orders Only on 04/25/2021  Component Date Value Ref Range Status   hCG,Beta Subunit,Qual,Serum  04/25/2021 Negative  Negative <6 mIU/mL Final  Clinical Support on 02/20/2021  Component Date Value Ref  Range Status   Preg Test, Ur 02/20/2021 Negative  Negative Final    Blood Alcohol level:  No results found for: [facility]  Metabolic Disorder Labs: Lab Results  Component Value Date   HGBA1C 4.5 (L) 05/05/2021   MPG 82.45 05/05/2021   MPG 91 04/28/2018   Lab Results  Component Value Date   PROLACTIN 74.5 (H) 04/28/2018   Lab Results  Component Value Date   CHOL 121 05/05/2021   TRIG 57 05/05/2021   HDL 52 05/05/2021   CHOLHDL 2.3 05/05/2021   VLDL 11 05/05/2021   LDLCALC 58 05/05/2021   LDLCALC 62 04/28/2018    Therapeutic Lab Levels: No results found for: LITHIUM No results found for: VALPROATE No components found for:  CBMZ  Physical Findings   AIMS    Flowsheet Row Admission (Discharged) from OP Visit from 04/28/2018 in [place] Total Score 0      GAD-7    [place] Office Visit from 01/02/2021 in [place] Visit from 06/07/2019 in [place] Visit from 09/23/2018 in [place] at [facility]  Total GAD-7 Score 9 14 6       PHQ2-9    [place] ED from 05/04/2021 in [facility] Counselor from 04/25/2021 in [place] Office Visit from 01/02/2021 in [place] from 11/06/2020 in [place] Office Visit from 06/07/2019 in [place]  PHQ-2 Total Score 5 4 2 6 3   PHQ-9 Total Score 16 15 7 22 16       Flowsheet Row ED from 07/24/2021 in [facility] Most recent reading at 07/24/2021  2:19 PM ED from 07/23/2021 in [place] DEPT Most recent reading at 07/23/2021  8:35 PM ED from 07/23/2021 in [facility] at [facility]  Most recent reading at 07/23/2021  6:20  PM  C-SSRS RISK CATEGORY High Risk Low Risk Error: Q2 is Yes, you must answer 3, 4, and 5        Musculoskeletal  Strength & Muscle Tone: within normal limits Gait & Station: normal Patient leans: N/A  Psychiatric Specialty Exam  Presentation  General Appearance: Appropriate for Environment; Casual; Fairly Groomed  Eye Contact:Good  Speech:Clear and Coherent; Normal Rate  Speech Volume:Normal  Handedness:Right   Mood and Affect  Mood:Depressed ("2/10")  Affect:Appropriate; Congruent; Other (comment) (a little brighter than yesterday)   Thought Process  Thought Processes:Coherent; Goal Directed; Linear  Descriptions of Associations:Intact  Orientation:Full (Time, Place and Person)  Thought Content:WDL; Logical  Diagnosis of Schizophrenia or Schizoaffective disorder in past: Yes  Duration of Psychotic Symptoms: Greater than six months   Hallucinations:Hallucinations: None  Ideas of Reference:None  Suicidal Thoughts:Suicidal Thoughts: No  Homicidal Thoughts:Homicidal Thoughts: No   Sensorium  Memory:Recent Good; Remote Good; Immediate Good  Judgment:Fair  Insight:Fair   Executive Functions  Concentration:Good  Attention Span:Good  [place] of Knowledge:Good  Language:Good   Psychomotor Activity  Psychomotor Activity:Psychomotor Activity: Normal   Assets  Assets:Communication Skills; Financial Resources/Insurance; Desire for Improvement; Other (comment); Vocational/Educational; Talents/Skills; Physical Health; Resilience; Housing (future thinking)   Sleep  Sleep:Sleep: Fair   Nutritional Assessment (For OBS and [facility] admissions only) Has the patient had a weight loss or gain of 10 pounds or more in the last 3 months?: -- (unknown) Has the patient had a decrease in food intake/or appetite?: -- (variable) Does  the patient have dental problems?: No Does the patient have eating habits or behaviors that may be indicators of an eating  disorder including binging or inducing vomiting?: No Has the patient recently lost weight without trying?: 0 Has the patient been eating poorly because of a decreased appetite?: 1 Malnutrition Screening Tool Score: 1    Physical Exam  Physical Exam Constitutional:      Appearance: Normal appearance. She is normal weight.  HENT:     Head: Normocephalic and atraumatic.  Pulmonary:     Effort: Pulmonary effort is normal.  Neurological:     Mental Status: She is alert and oriented to person, place, and time.   Review of Systems  Constitutional:  Negative for chills and fever.  HENT:  Negative for hearing loss.   Eyes:  Negative for discharge and redness.  Respiratory:  Negative for cough.   Cardiovascular:  Negative for chest pain.  Gastrointestinal:  Negative for abdominal pain.  Musculoskeletal:  Positive for back pain. Negative for myalgias.  Neurological:  Negative for headaches.  Blood pressure 112/74, pulse 83, temperature 97.7 F (36.5 C), temperature source Tympanic, resp. rate 16, SpO2 100 %. There is no height or weight on file to calculate BMI.  Treatment Plan Summary: 20 year old female with history of MDD and anxiety who presented to the [facility] ED on 12/5 for flulike symptoms and suicidal thoughts.  patient was unable to contract for safety after being medically cleared and was transferred to the [facility] on 12/6 for further treatment. UDS neg, labs unremarkable.    Patient denies current SI/HI/AVH on assessment today but shares that she has had some SI last night related to anxiety. She continues to report low mood and reports little improvement in sx. She appears objectively depressed but affect is somewhat brighter than yesterday. She remains appropriate for continued treatment at the [facility]   MDD with psychotic features R/o schizoaffective disorder Anxiety -continue zoloft to 50  mg- will consider increasing tomorrow if amenable-Will continue to monitor sx and titrate  as clinically indicated/appropriate -continue seroquel 25 mg as adjunct for mood -prn vistaril available for anxiety   Dispo: ongoing. SW to be consulted for assistance-likely discharge home with possble PHP follow up.   Ival Bible, MD 07/25/2021 3:02 PM

## 2021-07-26 NOTE — ED Notes (Signed)
Pt alert and oriented on the unit. Pt engaging with RN staff and other pts on the unit and denies SI/HI, A/VH. Pt attended groups this afternoon and has been pleasant and cooperative. Education, support, and encouragement provided, q15 minute safety checks remain in effect. Medications administered per MD orders. No reactions/side effects to medicine noted. Pt denies any concerns at this time, and verbally contracts for safety. Pt ambulating on the unit with no issues. Pt remains safe on the unit.

## 2021-07-26 NOTE — Clinical Social Work Psych Note (Signed)
CSW Update   Sheilyn reports she feels "much better". Altair denied having any SI, HI or AVH at this time.   Oniyah plans to return home after stabilization and will continue to follow up with her current providers at [facility] for outpatient medication management and therapy services.   CSW provided additional resources for [facility], if the patient is interested after potentially adjusting her work and school schedule.   Miaisabella reports she does not have any additional questions or concerns at this time.    CSW will continue to follow.    Baldo Daub, MSW, LCSW Clinical Child psychotherapist (Facility Based Crisis) [facility]

## 2021-07-26 NOTE — ED Notes (Signed)
Pt participating well in groups. Respirations are even and unlabored. No acute distress noted. Will continue to monitor for safety.

## 2021-07-26 NOTE — ED Notes (Signed)
Pt has taken her medication and is sitting in the chairs located on the unit talking with another patient.

## 2021-07-26 NOTE — Group Note (Signed)
Group Topic: Healthy Self Image and Positive Change  Group Date: 07/26/2021 Start Time: 1245 End Time: 1330 Facilitators: Levander Campion  Department: [facility]  Number of Participants: 3  Group Focus: activities of daily living skills and coping skills Treatment Modality:  Individual Therapy Interventions utilized were patient education and support Purpose: enhance coping skills, express feelings, express irrational fears, and trigger / craving management  Name: Gail [name] Date of Birth: Apr 25, 2001  MR: [id number]    Level of Participation: active Quality of Participation: attentive and cooperative Interactions with others: gave feedback Mood/Affect: appropriate Triggers (if applicable): n/a Cognition: coherent/clear Progress: Moderate Response: n/a Plan: follow-up needed  Patients Problems:  Patient Active Problem List   Diagnosis Date Noted   MDD (major depressive disorder), severe (HCC) 07/24/2021   Panic 05/08/2021   MDD (major depressive disorder), recurrent, severe, 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/2021   Initiation of Depo Provera 09/24/2018   MDD (major depressive disorder), single episode, severe with psychosis (HCC) 05/03/2018   Severe major depression with psychotic features, mood-congruent (HCC) 04/28/2018   Suicide ideation 04/28/2018   GAD (generalized anxiety disorder) 04/28/2018

## 2021-07-26 NOTE — ED Notes (Signed)
Snacks given 

## 2021-07-26 NOTE — ED Provider Notes (Signed)
Behavioral Health Progress Note  Date and Time: 07/26/2021 3:06 PM Name: Gail [name] MRN:  [id number]  Subjective:   20 year old female with history of MDD and anxiety who presented to the [facility], ED on 12/5 for flulike symptoms and suicidal thoughts.   patient was unable to contract for safety after being medically cleared and was transferred to the [facility] on 12/6 for further treatment.    Patient seen and chart review. She has been medication compliant, been appropriate with staff and peers on the unit, and has been attending some groups on the unit.  Patient interviewed this afternoon in her room; affect is much brighter than yesterday.  Patient states that her mood is "a little bit better" and rates her mood currently as 6 out of 10 (10 being the best).  She reports sleeping well and eating well.  She states that she had some dizziness this morning; however, it resolved after she ate breakfast.  No other physical complaints today.  She reports that she still has some anxiety that is primarily related to school and work.  She states that she spoke with her mother with contacted [employer] and that "worked things out with school".  Patient describes her mother contacting the dean of students who then contacted some of her professors to work out the plan to make up missed course work.  She states that her sister is still complex in contact with her workplace Public relations account executive) in order to arrange these medicines.  She denies SI/HI/AVH.  She states that she last had suicidal thoughts yesterday-passive.  Patient states that she has been attending groups and that she has learned about "classifying feelings" as well as safety plans.  Encouraged patient to continue attending groups.  Discussed with patient that the Zoloft dose could be increased from 50 mg to 100 mg; however, patient declines at this time and would like to continue 50 mg of Zoloft.     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 Past Medical History:  Past Medical History:  Diagnosis Date   Anxiety    Depression    Medical history non-contributory    Vision abnormalities     Past Surgical History:  Procedure Laterality Date   WISDOM TOOTH EXTRACTION     Family History:  Family History  Problem Relation Age of Onset   Rheum arthritis Mother    Lupus Mother    Obesity Mother    Hypertension Mother    Kidney disease Mother    Healthy Sister    Breast cancer Maternal Grandmother    Cancer Maternal Grandmother        breast cancer   Diabetes Maternal Grandmother    Hypertension Maternal Grandmother    Hyperlipidemia Maternal Grandmother    Breast cancer Cousin    Diabetes Father    Family Psychiatric  History:  Social History: Uncle with completed suicide; sister with bipolar disorder   Social History   Substance and Sexual Activity  Alcohol Use Yes   Comment: Rare     Social History   Substance and Sexual Activity  Drug Use Yes   Frequency: 3.0 times per week   Types: Marijuana    Social History   Socioeconomic History   Marital status: Single    Spouse name: Not on file   Number of children: Not on  file   Years of education: Not on file   Highest education level: Not on file  Occupational History   Not on file  Tobacco Use   Smoking status: Never   Smokeless tobacco: Never  Vaping Use   Vaping Use: Never used  Substance and Sexual Activity   Alcohol use: Yes    Comment: Rare   Drug use: Yes    Frequency: 3.0 times per week    Types: Marijuana   Sexual activity: Not Currently    Birth control/protection: Injection    Comment: 1st intercourse- 16, partners- 2,   Other Topics Concern   Not on file  Social History Narrative   Not on file   Social Determinants of Health   Financial Resource Strain:  Not on file  Food Insecurity: Not on file  Transportation Needs: Not on file  Physical Activity: Not on file  Stress: Not on file  Social Connections: Not on file   SDOH:  SDOH Screenings   Alcohol Screen: Not on file  Depression (PHQ2-9): Medium Risk   PHQ-2 Score: 16  Financial Resource Strain: Not on file  Food Insecurity: Not on file  Housing: Not on file  Physical Activity: Not on file  Social Connections: Not on file  Stress: Not on file  Tobacco Use: Low Risk    Smoking Tobacco Use: Never   Smokeless Tobacco Use: Never   Passive Exposure: Not on file  Transportation Needs: Not on file   Additional Social History:                         Sleep: Fair  Appetite:  Fair  Current Medications:  Current Facility-Administered Medications  Medication Dose Route Frequency Provider Last Rate Last Admin   acetaminophen (TYLENOL) tablet 650 mg  650 mg Oral Q6H PRN Ival Bible, MD       alum & mag hydroxide-simeth (MAALOX/MYLANTA) 200-200-20 MG/5ML suspension 30 mL  30 mL Oral Q4H PRN Ival Bible, MD       hydrOXYzine (ATARAX) tablet 25 mg  25 mg Oral TID PRN Ival Bible, MD       magnesium hydroxide (MILK OF MAGNESIA) suspension 30 mL  30 mL Oral Daily PRN Ival Bible, MD       QUEtiapine (SEROQUEL) tablet 25 mg  25 mg Oral QHS Ival Bible, MD   25 mg at 07/25/21 2136   sertraline (ZOLOFT) tablet 50 mg  50 mg Oral Daily Ival Bible, MD   50 mg at 07/26/21 1349   traZODone (DESYREL) tablet 50 mg  50 mg Oral QHS PRN Ival Bible, MD   50 mg at 07/25/21 2136   Current Outpatient Medications  Medication Sig Dispense Refill   QUEtiapine (SEROQUEL) 50 MG tablet Take 50 mg by mouth at bedtime.     sertraline (ZOLOFT) 50 MG tablet Take 1 tablet (50 mg total) by mouth daily. 7 tablet 0    Labs  Lab Results:  Admission on 07/23/2021, Discharged on 07/24/2021  Component Date Value Ref Range Status   Sodium  07/23/2021 136  135 - 145 mmol/L Final   Potassium 07/23/2021 3.7  3.5 - 5.1 mmol/L Final   Chloride 07/23/2021 104  98 - 111 mmol/L Final   CO2 07/23/2021 24  22 - 32 mmol/L Final   Glucose, Bld 07/23/2021 89  70 - 99 mg/dL Final   Glucose reference range applies only to samples taken  after fasting for at least 8 hours.   BUN 07/23/2021 10  6 - 20 mg/dL Final   Creatinine, Ser 07/23/2021 0.76  0.44 - 1.00 mg/dL Final   Calcium 07/23/2021 9.3  8.9 - 10.3 mg/dL Final   Total Protein 07/23/2021 7.5  6.5 - 8.1 g/dL Final   Albumin 07/23/2021 4.3  3.5 - 5.0 g/dL Final   AST 07/23/2021 16  15 - 41 U/L Final   ALT 07/23/2021 15  0 - 44 U/L Final   Alkaline Phosphatase 07/23/2021 51  38 - 126 U/L Final   Total Bilirubin 07/23/2021 1.7 (H)  0.3 - 1.2 mg/dL Final   GFR, Estimated 07/23/2021 >60  >60 mL/min Final   Comment: (NOTE) Calculated using the CKD-EPI Creatinine Equation (2021)    Anion gap 07/23/2021 8  5 - 15 Final   Performed at [facility], [place] [address], [place], [place] [address]   WBC 07/23/2021 8.4  4.0 - 10.5 K/uL Final   RBC 07/23/2021 4.51  3.87 - 5.11 MIL/uL Final   Hemoglobin 07/23/2021 13.9  12.0 - 15.0 g/dL Final   HCT 07/23/2021 43.3  36.0 - 46.0 % Final   MCV 07/23/2021 96.0  80.0 - 100.0 fL Final   MCH 07/23/2021 30.8  26.0 - 34.0 pg Final   MCHC 07/23/2021 32.1  30.0 - 36.0 g/dL Final   RDW 07/23/2021 12.3  11.5 - 15.5 % Final   Platelets 07/23/2021 352  150 - 400 K/uL Final   nRBC 07/23/2021 0.0  0.0 - 0.2 % Final   Neutrophils Relative % 07/23/2021 53  % Final   Neutro Abs 07/23/2021 4.5  1.7 - 7.7 K/uL Final   Lymphocytes Relative 07/23/2021 40  % Final   Lymphs Abs 07/23/2021 3.4  0.7 - 4.0 K/uL Final   Monocytes Relative 07/23/2021 5  % Final   Monocytes Absolute 07/23/2021 0.4  0.1 - 1.0 K/uL Final   Eosinophils Relative 07/23/2021 1  % Final   Eosinophils Absolute 07/23/2021 0.1  0.0 - 0.5 K/uL Final   Basophils Relative 07/23/2021 1   % Final   Basophils Absolute 07/23/2021 0.0  0.0 - 0.1 K/uL Final   Immature Granulocytes 07/23/2021 0  % Final   Abs Immature Granulocytes 07/23/2021 0.02  0.00 - 0.07 K/uL Final   Performed at [facility], [place] [address] [place], [place] [address]   Acetaminophen (Tylenol), Serum 07/23/2021 <10 (L)  10 - 30 ug/mL Final   Comment: (NOTE) Therapeutic concentrations vary significantly. A range of 10-30 ug/mL  may be an effective concentration for many patients. However, some  are best treated at concentrations outside of this range. Acetaminophen concentrations >150 ug/mL at 4 hours after ingestion  and >50 ug/mL at 12 hours after ingestion are often associated with  toxic reactions.  Performed at [facility], [place] [address], [place], [place] [id number]    Salicylate Lvl [id number] <7.0 (L)  7.0 - 30.0 mg/dL Final   Performed at  [address], [place], [place] [address]   I-stat hCG, quantitative 07/23/2021 <5.0  <5 mIU/mL Final   Comment 3 07/23/2021          Final   Comment:   GEST. AGE      CONC.  (mIU/mL)   <=1 WEEK        5 - 50     2 WEEKS       50 - 500  3 WEEKS       100 - 10,000     4 WEEKS     1,000 - 30,000        FEMALE AND NON-PREGNANT FEMALE:     LESS THAN 5 mIU/mL    SARS Coronavirus 2 by RT PCR 07/23/2021 NEGATIVE  NEGATIVE Final   Comment: (NOTE) SARS-CoV-2 target nucleic acids are NOT DETECTED.  The SARS-CoV-2 RNA is generally detectable in upper respiratory specimens during the acute phase of infection. The lowest concentration of SARS-CoV-2 viral copies this assay can detect is 138 copies/mL. A negative result does not preclude SARS-Cov-2 infection and should not be used as the sole basis for treatment or other patient management decisions. A negative result may occur with  improper specimen collection/handling, submission of specimen other than nasopharyngeal swab, presence of  viral mutation(s) within the areas targeted by this assay, and inadequate number of viral copies(<138 copies/mL). A negative result must be combined with clinical observations, patient history, and epidemiological information. The expected result is Negative.  Fact Sheet for Patients:  EntrepreneurPulse.com.au  Fact Sheet for Healthcare Providers:  IncredibleEmployment.be  This test is no                          t yet approved or cleared by the [place] FDA and  has been authorized for detection and/or diagnosis of SARS-CoV-2 by FDA under an Emergency Use Authorization (EUA). This EUA will remain  in effect (meaning this test can be used) for the duration of the COVID-19 declaration under Section 564(b)(1) of the Act, 21 U.S.C.section 360bbb-3(b)(1), unless the authorization is terminated  or revoked sooner.       Influenza A by PCR 07/23/2021 NEGATIVE  NEGATIVE Final   Influenza B by PCR 07/23/2021 NEGATIVE  NEGATIVE Final   Comment: (NOTE) The Xpert Xpress SARS-CoV-2/FLU/RSV plus assay is intended as an aid in the diagnosis of influenza from Nasopharyngeal swab specimens and should not be used as a sole basis for treatment. Nasal washings and aspirates are unacceptable for Xpert Xpress SARS-CoV-2/FLU/RSV testing.  Fact Sheet for Patients: EntrepreneurPulse.com.au  Fact Sheet for Healthcare Providers: IncredibleEmployment.be  This test is not yet approved or cleared by the [place] FDA and has been authorized for detection and/or diagnosis of SARS-CoV-2 by FDA under an Emergency Use Authorization (EUA). This EUA will remain in effect (meaning this test can be used) for the duration of the COVID-19 declaration under Section 564(b)(1) of the Act, 21 U.S.C. section 360bbb-3(b)(1), unless the authorization is terminated or revoked.  Performed at [facility], [place] [address] [place], [place] [address]    Opiates 07/23/2021 NONE DETECTED  NONE DETECTED Final   Cocaine 07/23/2021 NONE DETECTED  NONE DETECTED Final   Benzodiazepines 07/23/2021 NONE DETECTED  NONE DETECTED Final   Amphetamines 07/23/2021 NONE DETECTED  NONE DETECTED Final   Tetrahydrocannabinol 07/23/2021 NONE DETECTED  NONE DETECTED Final   Barbiturates 07/23/2021 NONE DETECTED  NONE DETECTED Final   Comment: (NOTE) DRUG SCREEN FOR MEDICAL PURPOSES ONLY.  IF CONFIRMATION IS NEEDED FOR ANY PURPOSE, NOTIFY LAB WITHIN 5 DAYS.  LOWEST DETECTABLE LIMITS FOR URINE DRUG SCREEN Drug Class                     Cutoff (ng/mL) Amphetamine and metabolites    1000 Barbiturate and metabolites    200 Benzodiazepine  200 Tricyclics and metabolites     300 Opiates and metabolites        300 Cocaine and metabolites        300 THC                            50 Performed at [facility], [address] [place], [place] [address]   Admission on 05/04/2021, Discharged on 05/07/2021  Component Date Value Ref Range Status   SARS Coronavirus 2 by RT PCR 05/05/2021 NEGATIVE  NEGATIVE Final   Comment: (NOTE) SARS-CoV-2 target nucleic acids are NOT DETECTED.  The SARS-CoV-2 RNA is generally detectable in upper respiratory specimens during the acute phase of infection. The lowest concentration of SARS-CoV-2 viral copies this assay can detect is 138 copies/mL. A negative result does not preclude SARS-Cov-2 infection and should not be used as the sole basis for treatment or other patient management decisions. A negative result may occur with  improper specimen collection/handling, submission of specimen other than nasopharyngeal swab, presence of viral mutation(s) within the areas targeted by this assay, and inadequate number of viral copies(<138 copies/mL). A negative result must be combined with clinical observations, patient history, and epidemiological information. The  expected result is Negative.  Fact Sheet for Patients:  BloggerCourse.comhttps://www.fda.gov/media/152166/download  Fact Sheet for Healthcare Providers:  SeriousBroker.ithttps://www.fda.gov/media/152162/download  This test is no                          t yet approved or cleared by the [place] FDA and  has been authorized for detection and/or diagnosis of SARS-CoV-2 by FDA under an Emergency Use Authorization (EUA). This EUA will remain  in effect (meaning this test can be used) for the duration of the COVID-19 declaration under Section 564(b)(1) of the Act, 21 U.S.C.section 360bbb-3(b)(1), unless the authorization is terminated  or revoked sooner.       Influenza A by PCR 05/05/2021 NEGATIVE  NEGATIVE Final   Influenza B by PCR 05/05/2021 NEGATIVE  NEGATIVE Final   Comment: (NOTE) The Xpert Xpress SARS-CoV-2/FLU/RSV plus assay is intended as an aid in the diagnosis of influenza from Nasopharyngeal swab specimens and should not be used as a sole basis for treatment. Nasal washings and aspirates are unacceptable for Xpert Xpress SARS-CoV-2/FLU/RSV testing.  Fact Sheet for Patients: BloggerCourse.comhttps://www.fda.gov/media/152166/download  Fact Sheet for Healthcare Providers: SeriousBroker.ithttps://www.fda.gov/media/152162/download  This test is not yet approved or cleared by the [place] FDA and has been authorized for detection and/or diagnosis of SARS-CoV-2 by FDA under an Emergency Use Authorization (EUA). This EUA will remain in effect (meaning this test can be used) for the duration of the COVID-19 declaration under Section 564(b)(1) of the Act, 21 U.S.C. section 360bbb-3(b)(1), unless the authorization is terminated or revoked.  Performed at [facility], [address] [place], [place] [address]    WBC 05/05/2021 6.8  4.0 - 10.5 K/uL Final   RBC 05/05/2021 4.28  3.87 - 5.11 MIL/uL Final   Hemoglobin 05/05/2021 13.1  12.0 - 15.0 g/dL Final   HCT 10/27/253609/17/2022 40.2  36.0 - 46.0 % Final   MCV 05/05/2021 93.9   80.0 - 100.0 fL Final   MCH 05/05/2021 30.6  26.0 - 34.0 pg Final   MCHC 05/05/2021 32.6  30.0 - 36.0 g/dL Final   RDW 64/40/347409/17/2022 12.1  11.5 - 15.5 % Final   Platelets 05/05/2021 342  150 - 400  K/uL Final   nRBC 05/05/2021 0.0  0.0 - 0.2 % Final   Neutrophils Relative % 05/05/2021 45  % Final   Neutro Abs 05/05/2021 3.1  1.7 - 7.7 K/uL Final   Lymphocytes Relative 05/05/2021 47  % Final   Lymphs Abs 05/05/2021 3.1  0.7 - 4.0 K/uL Final   Monocytes Relative 05/05/2021 5  % Final   Monocytes Absolute 05/05/2021 0.4  0.1 - 1.0 K/uL Final   Eosinophils Relative 05/05/2021 3  % Final   Eosinophils Absolute 05/05/2021 0.2  0.0 - 0.5 K/uL Final   Basophils Relative 05/05/2021 0  % Final   Basophils Absolute 05/05/2021 0.0  0.0 - 0.1 K/uL Final   Immature Granulocytes 05/05/2021 0  % Final   Abs Immature Granulocytes 05/05/2021 0.02  0.00 - 0.07 K/uL Final   Performed at [facility], [place] [address], [place], [place] [address]   Sodium 05/05/2021 136  135 - 145 mmol/L Final   Potassium 05/05/2021 3.6  3.5 - 5.1 mmol/L Final   Chloride 05/05/2021 107  98 - 111 mmol/L Final   CO2 05/05/2021 22  22 - 32 mmol/L Final   Glucose, Bld 05/05/2021 83  70 - 99 mg/dL Final   Glucose reference range applies only to samples taken after fasting for at least 8 hours.   BUN 05/05/2021 7  6 - 20 mg/dL Final   Creatinine, Ser 05/05/2021 0.69  0.44 - 1.00 mg/dL Final   Calcium 05/05/2021 9.3  8.9 - 10.3 mg/dL Final   Total Protein 05/05/2021 6.8  6.5 - 8.1 g/dL Final   Albumin 05/05/2021 3.8  3.5 - 5.0 g/dL Final   AST 05/05/2021 17  15 - 41 U/L Final   ALT 05/05/2021 17  0 - 44 U/L Final   Alkaline Phosphatase 05/05/2021 51  38 - 126 U/L Final   Total Bilirubin 05/05/2021 1.3 (H)  0.3 - 1.2 mg/dL Final   GFR, Estimated 05/05/2021 >60  >60 mL/min Final   Comment: (NOTE) Calculated using the CKD-EPI Creatinine Equation (2021)    Anion gap 05/05/2021 7  5 - 15 Final   Performed at [place] [address], [place], [place] [address]   Hgb A1c MFr Bld 05/05/2021 4.5 (L)  4.8 - 5.6 % Final   Comment: (NOTE) Pre diabetes:          5.7%-6.4%  Diabetes:              >6.4%  Glycemic control for   <7.0% adults with diabetes    Mean Plasma Glucose 05/05/2021 82.45  mg/dL Final   Performed at [facility], [place] [address], [place], [place] [address]   Cholesterol 05/05/2021 121  0 - 200 mg/dL Final   Triglycerides 05/05/2021 57  <150 mg/dL Final   HDL 05/05/2021 52  >40 mg/dL Final   Total CHOL/HDL Ratio 05/05/2021 2.3  RATIO Final   VLDL 05/05/2021 11  0 - 40 mg/dL Final   LDL Cholesterol 05/05/2021 58  0 - 99 mg/dL Final   Comment:        Total Cholesterol/HDL:CHD Risk Coronary Heart Disease Risk Table                     Men   Women  1/2 Average Risk   3.4   3.3  Average Risk       5.0   4.4  2 X Average Risk   9.6  7.1  3 X Average Risk  23.4   11.0        Use the calculated Patient Ratio above and the CHD Risk Table to determine the patient's CHD Risk.        ATP III CLASSIFICATION (LDL):  <100     mg/dL   Optimal  100-129  mg/dL   Near or Above                    Optimal  130-159  mg/dL   Borderline  160-189  mg/dL   High  >190     mg/dL   Very High Performed at [place] [address], [place], [place] [address]    TSH 05/05/2021 2.727  0.350 - 4.500 uIU/mL Final   Comment: Performed by a 3rd Generation assay with a functional sensitivity of <=0.01 uIU/mL. Performed at [facility], [place] [address], [place], [place] [address]    SARS Coronavirus 2 Ag 05/05/2021 Negative  Negative Final   POC Amphetamine UR 05/05/2021 None Detected  NONE DETECTED (Cut Off Level 1000 ng/mL) Final   POC Secobarbital (BAR) 05/05/2021 None Detected  NONE DETECTED (Cut Off Level 300 ng/mL) Final   POC Buprenorphine (BUP) 05/05/2021 None Detected  NONE DETECTED (Cut Off Level 10 ng/mL) Final   POC Oxazepam (BZO) 05/05/2021 None Detected  NONE DETECTED (Cut  Off Level 300 ng/mL) Final   POC Cocaine UR 05/05/2021 None Detected  NONE DETECTED (Cut Off Level 300 ng/mL) Final   POC Methamphetamine UR 05/05/2021 None Detected  NONE DETECTED (Cut Off Level 1000 ng/mL) Final   POC Morphine 05/05/2021 None Detected  NONE DETECTED (Cut Off Level 300 ng/mL) Final   POC Oxycodone UR 05/05/2021 None Detected  NONE DETECTED (Cut Off Level 100 ng/mL) Final   POC Methadone UR 05/05/2021 None Detected  NONE DETECTED (Cut Off Level 300 ng/mL) Final   POC Marijuana UR 05/05/2021 None Detected  NONE DETECTED (Cut Off Level 50 ng/mL) Final   Preg Test, Ur 05/05/2021 NEGATIVE  NEGATIVE Final   Comment:        THE SENSITIVITY OF THIS METHODOLOGY IS >24 mIU/mL    SARSCOV2ONAVIRUS 2 AG 05/05/2021 NEGATIVE  NEGATIVE Final   Comment: (NOTE) SARS-CoV-2 antigen NOT DETECTED.   Negative results are presumptive.  Negative results do not preclude SARS-CoV-2 infection and should not be used as the sole basis for treatment or other patient management decisions, including infection  control decisions, particularly in the presence of clinical signs and  symptoms consistent with COVID-19, or in those who have been in contact with the virus.  Negative results must be combined with clinical observations, patient history, and epidemiological information. The expected result is Negative.  Fact Sheet for Patients: HandmadeRecipes.com.cy  Fact Sheet for Healthcare Providers: FuneralLife.at  This test is not yet approved or cleared by the [place] FDA and  has been authorized for detection and/or diagnosis of SARS-CoV-2 by FDA under an Emergency Use Authorization (EUA).  This EUA will remain in effect (meaning this test can be used) for the duration of  the COV                          ID-19 declaration under Section 564(b)(1) of the Act, 21 U.S.C. section 360bbb-3(b)(1), unless the authorization is terminated or revoked  sooner.    Orders Only on 04/25/2021  Component Date Value Ref Range Status   hCG,Beta  Subunit,Qual,Serum 04/25/2021 Negative  Negative <6 mIU/mL Final  Clinical Support on 02/20/2021  Component Date Value Ref Range Status   Preg Test, Ur 02/20/2021 Negative  Negative Final    Blood Alcohol level:  No results found for: [facility]  Metabolic Disorder Labs: Lab Results  Component Value Date   HGBA1C 4.5 (L) 05/05/2021   MPG 82.45 05/05/2021   MPG 91 04/28/2018   Lab Results  Component Value Date   PROLACTIN 74.5 (H) 04/28/2018   Lab Results  Component Value Date   CHOL 121 05/05/2021   TRIG 57 05/05/2021   HDL 52 05/05/2021   CHOLHDL 2.3 05/05/2021   VLDL 11 05/05/2021   LDLCALC 58 05/05/2021   LDLCALC 62 04/28/2018    Therapeutic Lab Levels: No results found for: LITHIUM No results found for: VALPROATE No components found for:  CBMZ  Physical Findings   AIMS    Flowsheet Row Admission (Discharged) from OP Visit from 04/28/2018 in [place] Total Score 0      GAD-7    [place] Office Visit from 01/02/2021 in [place] Visit from 06/07/2019 in [place] Visit from 09/23/2018 in [place] at [facility]  Total GAD-7 Score 9 14 6       PHQ2-9    [place] ED from 05/04/2021 in [facility] Counselor from 04/25/2021 in [place] Office Visit from 01/02/2021 in [place] from 11/06/2020 in [place] Office Visit from 06/07/2019 in [place]  PHQ-2 Total Score 5 4 2 6 3   PHQ-9 Total Score 16 15 7 22 16       [place] ED from 07/24/2021 in [facility] Most recent reading at 07/24/2021  2:19 PM ED from 07/23/2021 in [place] DEPT Most recent reading at 07/23/2021   8:35 PM ED from 07/23/2021 in [facility]  Most recent reading at 07/23/2021  6:20 PM  C-SSRS RISK CATEGORY High Risk Low Risk Error: Q2 is Yes, you must answer 3, 4, and 5        Musculoskeletal  Strength & Muscle Tone: within normal limits Gait & Station: normal Patient leans: N/A  Psychiatric Specialty Exam  Presentation  General Appearance: Appropriate for Environment; Casual; Fairly Groomed  Eye Contact:Good  Speech:Clear and Coherent; Normal Rate  Speech Volume:Normal  Handedness:Right   Mood and Affect  Mood:Euthymic ("6/10")  Affect:Appropriate; Congruent; Other (comment) (brighter than yesterday)   Thought Process  Thought Processes:Coherent; Goal Directed; Linear  Descriptions of Associations:Intact  Orientation:Full (Time, Place and Person)  Thought Content:WDL; Logical  Diagnosis of Schizophrenia or Schizoaffective disorder in past: Yes  Duration of Psychotic Symptoms: Greater than six months   Hallucinations:Hallucinations: None  Ideas of Reference:None  Suicidal Thoughts:Suicidal Thoughts: No  Homicidal Thoughts:Homicidal Thoughts: No   Sensorium  Memory:Immediate Good; Recent Good; Remote Good  Judgment:Fair  Insight:Fair   Executive Functions  Concentration:Good  Attention Span:Good  [place]bright of Knowledge:Good  Language:Good   Psychomotor Activity  Psychomotor Activity:Psychomotor Activity: Normal   Assets  Assets:Communication Skills; Desire for Improvement; Housing; Catering manager; Physical Health; Resilience; Social Support; Vocational/Educational   Sleep  Sleep:Sleep: Fair   No data recorded   Physical Exam  Physical Exam Constitutional:      Appearance: Normal appearance. She is normal weight.  HENT:  Head: Normocephalic and atraumatic.  Eyes:     Extraocular Movements: Extraocular movements intact.     Conjunctiva/sclera: Conjunctivae normal.   Pulmonary:     Effort: Pulmonary effort is normal.  Neurological:     Mental Status: She is alert and oriented to person, place, and time.   Review of Systems  Constitutional:  Negative for chills and fever.  HENT:  Negative for hearing loss.   Eyes:  Negative for discharge and redness.  Respiratory:  Negative for cough.   Cardiovascular:  Negative for chest pain.  Gastrointestinal:  Negative for abdominal pain.  Musculoskeletal:  Negative for myalgias.  Neurological:  Positive for dizziness. Negative for headaches.       Dizziness this AM which resolved after eating  Psychiatric/Behavioral:  Positive for depression. Negative for substance abuse and suicidal ideas. The patient is nervous/anxious.   Blood pressure 100/67, pulse 67, temperature 97.8 F (36.6 C), temperature source Tympanic, resp. rate 20, SpO2 97 %. There is no height or weight on file to calculate BMI.  Treatment Plan Summary: 20 year old female with history of MDD and anxiety who presented to the [facility] ED on 12/5 for flulike symptoms and suicidal thoughts.  patient was unable to contract for safety after being medically cleared and was transferred to the [facility] on 12/6 for further treatment. UDS neg, labs unremarkable.    Patient denies current SI/HI/AVH on assessment today ; last experienced SI (passive) yesterday.she reports improvement in her mood today-previously rated mood 2 out of 10 & today is 6 out of 10.  Affect is objectively brighter.  Discussed increasing Zoloft dose today however patient claims this time.  She remains appropriate for continued treatment at the [facility]   MDD with psychotic features R/o schizoaffective disorder Anxiety -continue zoloft to 50  mg-discussed possibly increasing medication today; however, patient declined at this time.  Will continue to monitor sx and titrate as clinically indicated/appropriate -continue seroquel 25 mg as adjunct for mood -prn vistaril available for anxiety    Dispo: ongoing. SW to be consulted for assistance-likely discharge home with possble PHP follow up.   Ival Bible, MD 07/26/2021 3:06 PM

## 2021-07-26 NOTE — ED Notes (Signed)
Pt asleep with even and unlabored respirations. No distress or discomfort noted. Pt remains safe on the unit. Will continue to monitor. 

## 2021-07-26 NOTE — Progress Notes (Signed)
SPIRITUALITY GROUP NOTE  Spirituality group facilitated by Wilkie Aye, MDiv, BCC.  Group Description:  Group focused on topic of hope.  Patients participated in facilitated discussion around topic, connecting with one another around experiences and definitions for hope.  Group members engaged with visual explorer photos, reflecting on what hope looks like for them today.  Group engaged in discussion around how their definitions of hope are present today in hospital.   Modalities: Psycho-social ed, Adlerian, Narrative, MI Patient Progress:  Kyan was present throughout group.  Engaged in group discussion with facilitator and other group members.

## 2021-07-26 NOTE — ED Notes (Signed)
Pt is currently in the dining room watching television. 

## 2021-07-26 NOTE — ED Notes (Signed)
Pt is sleeping in the bed. Respirations are even and unlabored. No acute distress noted. Will continue to monitor for safety. °

## 2021-07-26 NOTE — ED Notes (Signed)
Pt is currently sleeping, no distress noted, environmental check complete, will continue to monitor patient for safety. ? ?

## 2021-07-27 MED ORDER — SERTRALINE HCL 50 MG PO TABS: 50.0000 mg | ORAL_TABLET | Freq: Every day | ORAL | 0 refills | Status: DC

## 2021-07-27 MED ORDER — QUETIAPINE FUMARATE 25 MG PO TABS: 25.0000 mg | ORAL_TABLET | Freq: Every day | ORAL | 0 refills | Status: DC

## 2021-07-27 NOTE — Progress Notes (Signed)
Pt is watching TV quietly. No distress noted. Monitoring for pt's safety.

## 2021-07-27 NOTE — ED Notes (Signed)
Pt is sleeping in the bed. Respirations are even and unlabored. No acute distress noted. Will continue to monitor for safety. °

## 2021-07-27 NOTE — Discharge Instructions (Signed)

## 2021-07-27 NOTE — Discharge Summary (Signed)
Gail [name] to be D/C'd Home per MD order. Discussed with the patient and all questions fully answered. An After Visit Summary was printed and given to the patient. Medication samples and scripts were also given to patient.Patient escorted via WC, and D/C home via private auto.  Dickie La  07/27/2021 3:32 PM

## 2021-07-27 NOTE — ED Notes (Signed)
She refused breakfast

## 2021-07-27 NOTE — Progress Notes (Signed)
Pt is awake, alert and oriented. Pt did not voice any complaints of pain or discomfort. No signs of acute distress noted. Administered scheduled med with no incident. Pt denies current SI/HI/AVH. Staff will monitor for pt's safety.

## 2021-07-27 NOTE — ED Provider Notes (Signed)
FBC/OBS ASAP Discharge Summary  Date and Time: 07/27/2021 2:26 PM  Name: Gail [name]  MRN:  [id number]   Discharge Diagnoses:  Final diagnoses:  MDD (major depressive disorder), severe ([place])    Subjective: See progress note from earlier today-patient was initially amenable to continue treatment; however, later requested discharge.  See below for additional details-patient was appropriate for discharge as she was voluntary and was not not deemed an imminent threat to self, others, or psychotic and did not meet criteria for IVC.  Stay Summary:  20 year old female with history of MDD and anxiety who presented to the [facility] ED on 12/5 for flulike symptoms and suicidal thoughts.   patient was unable to contract for safety after being medically cleared and was transferred to the [facility] on 12/6 for further treatment.  Patient was continued on outpatient medications of prescribed Zoloft 50 mg and Seroquel 25 mg- she reported that she was prescribed Zoloft 50 mg by her outpatient provider although had not picked it up and therefore it was started during her admission at the [facility] on 12/6.  Tolerated Zoloft well without side effects or adverse events; discussed with patient that Zoloft could be increased to 100 mg on 12/8 however patient declined and she was continued on 50 mg throughout the remainder of her stay until discharge  12/9.    Patient attended groups and was appropriate with staff and peers throughout her stay, by day of discharge patient's mood had significantly improved and she displayed much brighter affect.  Patient was initially amenable to staying through the day on 12/9; however, she later requested to be discharged on this date.   Improvement was monitored by observation by staff and Emotional and mental status was monitored by daily by staff. Upon completion of this admission the Gail [name] was both mentally and medically stable for discharge denying  suicidal/homicidal ideation, symptoms that would be consistent with psychosis (AVH, IOR, paranoia, etc)  Patient was voluntary and was provided with 30-day scripts with no refills as well as 7 day samples.  Referral was made to [facility] program and patient was advised to call but to call number and discharge paperwork for appointment time.  On my interview today, day of discharge, , patient is in NAD, alert, oriented, calm, cooperative, and attentive, with normal affect, speech, and behavior. Objectively, there is no evidence of psychosis/ mania (able to converse coherently, linear and goal directed thought, no RIS, no distractibility, not pre-occupied, no FOI, etc) nor depression to the point of suicidality (able to concentrate, affect full and reactive, speech normal r/v/t, no psychomotor retardation/agitation, etc).  Overall, patient appears to be at the point, in the absence of inhibiting or disinhibiting symptoms, where she can successfully move to lesser restrictive setting for care.      Total Time spent with patient: 20 minutes  Past Psychiatric History: MDD with psychosis, MDD, anxiety, reported h/o ?schizophrenia Past Medical History:  Past Medical History:  Diagnosis Date   Anxiety    Depression    Medical history non-contributory    Vision abnormalities     Past Surgical History:  Procedure Laterality Date   WISDOM TOOTH EXTRACTION     Family History:  Family History  Problem Relation Age of Onset   Rheum arthritis Mother    Lupus Mother    Obesity Mother    Hypertension Mother    Kidney disease Mother    Healthy Sister    Breast cancer Maternal Grandmother  Cancer Maternal Grandmother        breast cancer   Diabetes Maternal Grandmother    Hypertension Maternal Grandmother    Hyperlipidemia Maternal Grandmother    Breast cancer Cousin    Diabetes Father    Family Psychiatric History: Uncle with completed suicide; sister with bipolar disorder Social History:   Social History   Substance and Sexual Activity  Alcohol Use Yes   Comment: Rare     Social History   Substance and Sexual Activity  Drug Use Yes   Frequency: 3.0 times per week   Types: Marijuana    Social History   Socioeconomic History   Marital status: Single    Spouse name: Not on file   Number of children: Not on file   Years of education: Not on file   Highest education level: Not on file  Occupational History   Not on file  Tobacco Use   Smoking status: Never   Smokeless tobacco: Never  Vaping Use   Vaping Use: Never used  Substance and Sexual Activity   Alcohol use: Yes    Comment: Rare   Drug use: Yes    Frequency: 3.0 times per week    Types: Marijuana   Sexual activity: Not Currently    Birth control/protection: Injection    Comment: 1st intercourse- 16, partners- 2,   Other Topics Concern   Not on file  Social History Narrative   Not on file   Social Determinants of Health   Financial Resource Strain: Not on file  Food Insecurity: Not on file  Transportation Needs: Not on file  Physical Activity: Not on file  Stress: Not on file  Social Connections: Not on file   SDOH:  SDOH Screenings   Alcohol Screen: Not on file  Depression (PHQ2-9): Medium Risk   PHQ-2 Score: 16  Financial Resource Strain: Not on file  Food Insecurity: Not on file  Housing: Not on file  Physical Activity: Not on file  Social Connections: Not on file  Stress: Not on file  Tobacco Use: Low Risk    Smoking Tobacco Use: Never   Smokeless Tobacco Use: Never   Passive Exposure: Not on file  Transportation Needs: Not on file    Tobacco Cessation:  N/A, patient does not currently use tobacco products  Current Medications:  Current Facility-Administered Medications  Medication Dose Route Frequency Provider Last Rate Last Admin   acetaminophen (TYLENOL) tablet 650 mg  650 mg Oral Q6H PRN Ival Bible, MD       alum & mag hydroxide-simeth (MAALOX/MYLANTA)  200-200-20 MG/5ML suspension 30 mL  30 mL Oral Q4H PRN Ival Bible, MD       hydrOXYzine (ATARAX) tablet 25 mg  25 mg Oral TID PRN Ival Bible, MD       magnesium hydroxide (MILK OF MAGNESIA) suspension 30 mL  30 mL Oral Daily PRN Ival Bible, MD       QUEtiapine (SEROQUEL) tablet 25 mg  25 mg Oral QHS Ival Bible, MD   25 mg at 07/26/21 2119   sertraline (ZOLOFT) tablet 50 mg  50 mg Oral Daily Ival Bible, MD   50 mg at 07/27/21 1007   traZODone (DESYREL) tablet 50 mg  50 mg Oral QHS PRN Ival Bible, MD   50 mg at 07/26/21 2119   Current Outpatient Medications  Medication Sig Dispense Refill   QUEtiapine (SEROQUEL) 50 MG tablet Take 50 mg by mouth at bedtime.  sertraline (ZOLOFT) 50 MG tablet Take 1 tablet (50 mg total) by mouth daily. 7 tablet 0    PTA Medications: (Not in a hospital admission)   Musculoskeletal  Strength & Muscle Tone: within normal limits Gait & Station: normal Patient leans: N/A  Psychiatric Specialty Exam  Presentation  General Appearance: Appropriate for Environment; Casual; Fairly Groomed  Eye Contact:Good  Speech:Normal Rate; Clear and Coherent  Speech Volume:Normal  Handedness:Right   Mood and Affect  Mood:Euthymic ("good"; rates 6/10)  Affect:Appropriate; Congruent; Full Range; Other (comment) (brighter, full range of affect noted today)   Thought Process  Thought Processes:Coherent; Goal Directed; Linear  Descriptions of Associations:Intact  Orientation:Full (Time, Place and Person)  Thought Content:WDL; Logical  Diagnosis of Schizophrenia or Schizoaffective disorder in past: Yes  Duration of Psychotic Symptoms: Greater than six months   Hallucinations:Hallucinations: None  Ideas of Reference:None  Suicidal Thoughts:Suicidal Thoughts: No  Homicidal Thoughts:Homicidal Thoughts: No   Sensorium  Memory:Immediate Good; Recent Good; Remote  Good  Judgment:Good  Insight:Good   Executive Functions  Concentration:Good  Attention Span:Good  Recall:Good  Fund of Knowledge:Good  Language:Good   Psychomotor Activity  Psychomotor Activity:Psychomotor Activity: Normal   Assets  Assets:Communication Skills; Desire for Improvement; Housing; Physical Health; Resilience; Social Support; Vocational/Educational   Sleep  Sleep:Sleep: Good   No data recorded  Physical Exam  Physical Exam Vitals and nursing note reviewed.  Constitutional:      Appearance: Normal appearance. She is normal weight.  HENT:     Head: Normocephalic and atraumatic.  Eyes:     Extraocular Movements: Extraocular movements intact.     Conjunctiva/sclera: Conjunctivae normal.  Pulmonary:     Effort: Pulmonary effort is normal.  Neurological:     Mental Status: She is alert and oriented to person, place, and time.  Psychiatric:        Mood and Affect: Mood normal.        Behavior: Behavior normal.        Judgment: Judgment normal.    Review of Systems  Constitutional:  Negative for chills and fever.  HENT:  Negative for hearing loss.   Eyes:  Negative for discharge and redness.  Respiratory:  Negative for cough.   Cardiovascular:  Negative for chest pain.  Gastrointestinal:  Negative for abdominal pain.  Musculoskeletal:  Negative for myalgias.  Neurological:  Negative for dizziness and headaches.  Psychiatric/Behavioral:  Negative for depression, hallucinations, substance abuse and suicidal ideas. The patient is not nervous/anxious.    Blood pressure 99/60, pulse 70, temperature 97.7 F (36.5 C), temperature source Tympanic, resp. rate 16, SpO2 99 %. There is no height or weight on file to calculate BMI.  Demographic Factors:  Adolescent or young adult  Loss Factors: NA  Historical Factors: Family history of mental illness or substance abuse  Risk Reduction Factors:   Sense of responsibility to family, Employed, Living  with another person, especially a relative, Positive social support, and Positive coping skills or problem solving skills  Continued Clinical Symptoms:  Depression:   Recent sense of peace/wellbeing Previous Psychiatric Diagnoses and Treatments  Cognitive Features That Contribute To Risk:  None    Suicide Risk:  Minimal: No identifiable suicidal ideation.  Patients presenting with no risk factors but with morbid ruminations; may be classified as minimal risk based on the severity of the depressive symptoms  Plan Of Care/Follow-up recommendations:  Activity:  as tolerated Diet:  regular Other:     Take all medications as prescribed by his/her mental  healthcare provider. Report any adverse effects and or reactions from the medicines to your outpatient provider promptly. Do not engage in alcohol and or illegal drug use while on prescription medicines. In the event of worsening symptoms, call the crisis hotline, 911 and or go to the nearest ED for appropriate evaluation and treatment of symptoms. follow-up with your primary care provider for your other medical issues, concerns and or health care needs.  Medication List     TAKE these medications    QUEtiapine 25 MG tablet Commonly known as: SEROQUEL Take 1 tablet (25 mg total) by mouth at bedtime. What changed:  medication strength how much to take   sertraline 50 MG tablet Commonly known as: ZOLOFT Take 1 tablet (50 mg total) by mouth daily.       Patient was provided with scripts for 30 days of above medications with no refills as well as 7-day samples.  Disposition: home, self care  Ival Bible, MD 07/27/2021, 2:26 PM

## 2021-07-27 NOTE — ED Provider Notes (Signed)
[facility]Behavioral Health Progress Note  Date and Time: 07/27/2021 1:57 PM Name: Gail [name] MRN:  [id number]  Subjective:   20 year old female with history of MDD and anxiety who presented to the [facility] ED on 12/5 for flulike symptoms and suicidal thoughts.   patient was unable to contract for safety after being medically cleared and was transferred to the [facility] on 12/6 for further treatment.   Patient seen and chart reviewed. She has been medication compliant, been appropriate with staff and peers and has been attending  groups on the unit.   Patient interviewed in her room this morning, patient is found lying in bed in no acute distress-affect is full range patient states that her mood is "good" and which mood is 6 out of 10 (10 being the best.  She states that she slept well and had a "good" day yesterday.  Patient states that she has been attending all groups and that they have been "great and really informative".  She denies SI/HI/AVH.  She reports a good appetite and states that she does not normally eat breakfast at home.  She denies all physical complaints today.  She states that she would be interested in attending the partial hospitalization program as long as she was able to get paperwork filled out for excused absence from work.  Discussed with patient that the [facility] program does typically have a 6 social worker who assists patients with this and that social work will be informed for questions/concerns about attending PHP.  She states that her place to work is requesting FMLA forms during her stay while she is here and that they would like to fax it over.  Informed patient that I will share this information with social work who typically discussed with this patient verbalized understanding.  Discussed discharge planning with patient-she is unsure about discharge initially but then indicates that she would feel safe to leave as early as today or tomorrow if she was able to  start PHP quickly.  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 Past Medical History:  Past Medical History:  Diagnosis Date   Anxiety    Depression    Medical history non-contributory    Vision abnormalities     Past Surgical History:  Procedure Laterality Date   WISDOM TOOTH EXTRACTION     Family History:  Family History  Problem Relation Age of Onset   Rheum arthritis Mother    Lupus Mother    Obesity Mother    Hypertension Mother    Kidney disease Mother    Healthy Sister    Breast cancer Maternal Grandmother    Cancer Maternal Grandmother        breast cancer   Diabetes Maternal Grandmother    Hypertension Maternal Grandmother    Hyperlipidemia Maternal Grandmother    Breast cancer Cousin    Diabetes Father    Family Psychiatric  History:  Social History: Uncle with completed suicide; sister with bipolar disorder   Social History   Substance and Sexual Activity  Alcohol Use Yes   Comment: Rare     Social History   Substance and Sexual Activity  Drug Use Yes   Frequency: 3.0 times per week   Types: Marijuana  Social History   Socioeconomic History   Marital status: Single    Spouse name: Not on file   Number of children: Not on file   Years of education: Not on file   Highest education level: Not on file  Occupational History   Not on file  Tobacco Use   Smoking status: Never   Smokeless tobacco: Never  Vaping Use   Vaping Use: Never used  Substance and Sexual Activity   Alcohol use: Yes    Comment: Rare   Drug use: Yes    Frequency: 3.0 times per week    Types: Marijuana   Sexual activity: Not Currently    Birth control/protection: Injection    Comment: 1st intercourse- 16, partners- 2,   Other  Topics Concern   Not on file  Social History Narrative   Not on file   Social Determinants of Health   Financial Resource Strain: Not on file  Food Insecurity: Not on file  Transportation Needs: Not on file  Physical Activity: Not on file  Stress: Not on file  Social Connections: Not on file   SDOH:  SDOH Screenings   Alcohol Screen: Not on file  Depression (PHQ2-9): Medium Risk   PHQ-2 Score: 16  Financial Resource Strain: Not on file  Food Insecurity: Not on file  Housing: Not on file  Physical Activity: Not on file  Social Connections: Not on file  Stress: Not on file  Tobacco Use: Low Risk    Smoking Tobacco Use: Never   Smokeless Tobacco Use: Never   Passive Exposure: Not on file  Transportation Needs: Not on file   Additional Social History:                         Sleep: Good  Appetite:  Fair  Current Medications:  Current Facility-Administered Medications  Medication Dose Route Frequency Provider Last Rate Last Admin   acetaminophen (TYLENOL) tablet 650 mg  650 mg Oral Q6H PRN Ival Bible, MD       alum & mag hydroxide-simeth (MAALOX/MYLANTA) 200-200-20 MG/5ML suspension 30 mL  30 mL Oral Q4H PRN Ival Bible, MD       hydrOXYzine (ATARAX) tablet 25 mg  25 mg Oral TID PRN Ival Bible, MD       magnesium hydroxide (MILK OF MAGNESIA) suspension 30 mL  30 mL Oral Daily PRN Ival Bible, MD       QUEtiapine (SEROQUEL) tablet 25 mg  25 mg Oral QHS Ival Bible, MD   25 mg at 07/26/21 2119   sertraline (ZOLOFT) tablet 50 mg  50 mg Oral Daily Ival Bible, MD   50 mg at 07/27/21 1007   traZODone (DESYREL) tablet 50 mg  50 mg Oral QHS PRN Ival Bible, MD   50 mg at 07/26/21 2119   Current Outpatient Medications  Medication Sig Dispense Refill   QUEtiapine (SEROQUEL) 50 MG tablet Take 50 mg by mouth at bedtime.     sertraline (ZOLOFT) 50 MG tablet Take 1 tablet (50 mg total) by mouth daily. 7  tablet 0    Labs  Lab Results:  Admission on 07/23/2021, Discharged on 07/24/2021  Component Date Value Ref Range Status   Sodium 07/23/2021 136  135 - 145 mmol/L Final   Potassium 07/23/2021 3.7  3.5 - 5.1 mmol/L Final   Chloride 07/23/2021 104  98 - 111 mmol/L Final   CO2 07/23/2021 24  22 -  32 mmol/L Final   Glucose, Bld 07/23/2021 89  70 - 99 mg/dL Final   Glucose reference range applies only to samples taken after fasting for at least 8 hours.   BUN 07/23/2021 10  6 - 20 mg/dL Final   Creatinine, Ser 07/23/2021 0.76  0.44 - 1.00 mg/dL Final   Calcium 07/23/2021 9.3  8.9 - 10.3 mg/dL Final   Total Protein 07/23/2021 7.5  6.5 - 8.1 g/dL Final   Albumin 07/23/2021 4.3  3.5 - 5.0 g/dL Final   AST 07/23/2021 16  15 - 41 U/L Final   ALT 07/23/2021 15  0 - 44 U/L Final   Alkaline Phosphatase 07/23/2021 51  38 - 126 U/L Final   Total Bilirubin 07/23/2021 1.7 (H)  0.3 - 1.2 mg/dL Final   GFR, Estimated 07/23/2021 >60  >60 mL/min Final   Comment: (NOTE) Calculated using the CKD-EPI Creatinine Equation (2021)    Anion gap 07/23/2021 8  5 - 15 Final   Performed at [facility], [place] [address] [place], [place] [address]   WBC 07/23/2021 8.4  4.0 - 10.5 K/uL Final   RBC 07/23/2021 4.51  3.87 - 5.11 MIL/uL Final   Hemoglobin 07/23/2021 13.9  12.0 - 15.0 g/dL Final   HCT 07/23/2021 43.3  36.0 - 46.0 % Final   MCV 07/23/2021 96.0  80.0 - 100.0 fL Final   MCH 07/23/2021 30.8  26.0 - 34.0 pg Final   MCHC 07/23/2021 32.1  30.0 - 36.0 g/dL Final   RDW 07/23/2021 12.3  11.5 - 15.5 % Final   Platelets 07/23/2021 352  150 - 400 K/uL Final   nRBC 07/23/2021 0.0  0.0 - 0.2 % Final   Neutrophils Relative % 07/23/2021 53  % Final   Neutro Abs 07/23/2021 4.5  1.7 - 7.7 K/uL Final   Lymphocytes Relative 07/23/2021 40  % Final   Lymphs Abs 07/23/2021 3.4  0.7 - 4.0 K/uL Final   Monocytes Relative 07/23/2021 5  % Final   Monocytes Absolute 07/23/2021 0.4  0.1 - 1.0 K/uL Final    Eosinophils Relative 07/23/2021 1  % Final   Eosinophils Absolute 07/23/2021 0.1  0.0 - 0.5 K/uL Final   Basophils Relative 07/23/2021 1  % Final   Basophils Absolute 07/23/2021 0.0  0.0 - 0.1 K/uL Final   Immature Granulocytes 07/23/2021 0  % Final   Abs Immature Granulocytes 07/23/2021 0.02  0.00 - 0.07 K/uL Final   Performed at [facility], [place] [address] [place], [place] [address]   Acetaminophen (Tylenol), Serum 07/23/2021 <10 (L)  10 - 30 ug/mL Final   Comment: (NOTE) Therapeutic concentrations vary significantly. A range of 10-30 ug/mL  may be an effective concentration for many patients. However, some  are best treated at concentrations outside of this range. Acetaminophen concentrations >150 ug/mL at 4 hours after ingestion  and >50 ug/mL at 12 hours after ingestion are often associated with  toxic reactions.  Performed at [facility], [place] [address], [place], [place] [id number]    Salicylate Lvl [id number] <7.0 (L)  7.0 - 30.0 mg/dL Final   Performed at [place] [address], [place], [place] [address]   I-stat hCG, quantitative 07/23/2021 <5.0  <5 mIU/mL Final   Comment 3 07/23/2021          Final   Comment:   GEST. AGE      CONC.  (mIU/mL)   <=1 WEEK  5 - 50     2 WEEKS       50 - 500     3 WEEKS       100 - 10,000     4 WEEKS     1,000 - 30,000        FEMALE AND NON-PREGNANT FEMALE:     LESS THAN 5 mIU/mL    SARS Coronavirus 2 by RT PCR 07/23/2021 NEGATIVE  NEGATIVE Final   Comment: (NOTE) SARS-CoV-2 target nucleic acids are NOT DETECTED.  The SARS-CoV-2 RNA is generally detectable in upper respiratory specimens during the acute phase of infection. The lowest concentration of SARS-CoV-2 viral copies this assay can detect is 138 copies/mL. A negative result does not preclude SARS-Cov-2 infection and should not be used as the sole basis for treatment or other patient management decisions. A  negative result may occur with  improper specimen collection/handling, submission of specimen other than nasopharyngeal swab, presence of viral mutation(s) within the areas targeted by this assay, and inadequate number of viral copies(<138 copies/mL). A negative result must be combined with clinical observations, patient history, and epidemiological information. The expected result is Negative.  Fact Sheet for Patients:  EntrepreneurPulse.com.au  Fact Sheet for Healthcare Providers:  IncredibleEmployment.be  This test is no                          t yet approved or cleared by the [place] FDA and  has been authorized for detection and/or diagnosis of SARS-CoV-2 by FDA under an Emergency Use Authorization (EUA). This EUA will remain  in effect (meaning this test can be used) for the duration of the COVID-19 declaration under Section 564(b)(1) of the Act, 21 U.S.C.section 360bbb-3(b)(1), unless the authorization is terminated  or revoked sooner.       Influenza A by PCR 07/23/2021 NEGATIVE  NEGATIVE Final   Influenza B by PCR 07/23/2021 NEGATIVE  NEGATIVE Final   Comment: (NOTE) The Xpert Xpress SARS-CoV-2/FLU/RSV plus assay is intended as an aid in the diagnosis of influenza from Nasopharyngeal swab specimens and should not be used as a sole basis for treatment. Nasal washings and aspirates are unacceptable for Xpert Xpress SARS-CoV-2/FLU/RSV testing.  Fact Sheet for Patients: EntrepreneurPulse.com.au  Fact Sheet for Healthcare Providers: IncredibleEmployment.be  This test is not yet approved or cleared by the [place] FDA and has been authorized for detection and/or diagnosis of SARS-CoV-2 by FDA under an Emergency Use Authorization (EUA). This EUA will remain in effect (meaning this test can be used) for the duration of the COVID-19 declaration under Section 564(b)(1) of the Act, 21  U.S.C. section 360bbb-3(b)(1), unless the authorization is terminated or revoked.  Performed at [facility], [place] [address] [place], [place] [address]    Opiates 07/23/2021 NONE DETECTED  NONE DETECTED Final   Cocaine 07/23/2021 NONE DETECTED  NONE DETECTED Final   Benzodiazepines 07/23/2021 NONE DETECTED  NONE DETECTED Final   Amphetamines 07/23/2021 NONE DETECTED  NONE DETECTED Final   Tetrahydrocannabinol 07/23/2021 NONE DETECTED  NONE DETECTED Final   Barbiturates 07/23/2021 NONE DETECTED  NONE DETECTED Final   Comment: (NOTE) DRUG SCREEN FOR MEDICAL PURPOSES ONLY.  IF CONFIRMATION IS NEEDED FOR ANY PURPOSE, NOTIFY LAB WITHIN 5 DAYS.  LOWEST DETECTABLE LIMITS FOR URINE DRUG SCREEN Drug Class                     Cutoff (ng/mL) Amphetamine and metabolites  1000 Barbiturate and metabolites    200 Benzodiazepine                 200 Tricyclics and metabolites     300 Opiates and metabolites        300 Cocaine and metabolites        300 THC                            50 Performed at [facility], [address], [place], [place] [address]   Admission on 05/04/2021, Discharged on 05/07/2021  Component Date Value Ref Range Status   SARS Coronavirus 2 by RT PCR 05/05/2021 NEGATIVE  NEGATIVE Final   Comment: (NOTE) SARS-CoV-2 target nucleic acids are NOT DETECTED.  The SARS-CoV-2 RNA is generally detectable in upper respiratory specimens during the acute phase of infection. The lowest concentration of SARS-CoV-2 viral copies this assay can detect is 138 copies/mL. A negative result does not preclude SARS-Cov-2 infection and should not be used as the sole basis for treatment or other patient management decisions. A negative result may occur with  improper specimen collection/handling, submission of specimen other than nasopharyngeal swab, presence of viral mutation(s) within the areas targeted by this assay, and inadequate number of  viral copies(<138 copies/mL). A negative result must be combined with clinical observations, patient history, and epidemiological information. The expected result is Negative.  Fact Sheet for Patients:  BloggerCourse.com  Fact Sheet for Healthcare Providers:  SeriousBroker.it  This test is no                          t yet approved or cleared by the [place] FDA and  has been authorized for detection and/or diagnosis of SARS-CoV-2 by FDA under an Emergency Use Authorization (EUA). This EUA will remain  in effect (meaning this test can be used) for the duration of the COVID-19 declaration under Section 564(b)(1) of the Act, 21 U.S.C.section 360bbb-3(b)(1), unless the authorization is terminated  or revoked sooner.       Influenza A by PCR 05/05/2021 NEGATIVE  NEGATIVE Final   Influenza B by PCR 05/05/2021 NEGATIVE  NEGATIVE Final   Comment: (NOTE) The Xpert Xpress SARS-CoV-2/FLU/RSV plus assay is intended as an aid in the diagnosis of influenza from Nasopharyngeal swab specimens and should not be used as a sole basis for treatment. Nasal washings and aspirates are unacceptable for Xpert Xpress SARS-CoV-2/FLU/RSV testing.  Fact Sheet for Patients: BloggerCourse.com  Fact Sheet for Healthcare Providers: SeriousBroker.it  This test is not yet approved or cleared by the [place] FDA and has been authorized for detection and/or diagnosis of SARS-CoV-2 by FDA under an Emergency Use Authorization (EUA). This EUA will remain in effect (meaning this test can be used) for the duration of the COVID-19 declaration under Section 564(b)(1) of the Act, 21 U.S.C. section 360bbb-3(b)(1), unless the authorization is terminated or revoked.  Performed at [facility], [address], [place], [place] [address]    WBC 05/05/2021 6.8  4.0 - 10.5 K/uL Final   RBC 05/05/2021 4.28   3.87 - 5.11 MIL/uL Final   Hemoglobin 05/05/2021 13.1  12.0 - 15.0 g/dL Final   HCT 87/68/1157 40.2  36.0 - 46.0 % Final   MCV 05/05/2021 93.9  80.0 - 100.0 fL Final   MCH 05/05/2021 30.6  26.0 - 34.0 pg Final   MCHC 05/05/2021 32.6  30.0 - 36.0 g/dL Final   RDW 16/10/960409/17/2022 12.1  11.5 - 15.5 % Final   Platelets 05/05/2021 342  150 - 400 K/uL Final   nRBC 05/05/2021 0.0  0.0 - 0.2 % Final   Neutrophils Relative % 05/05/2021 45  % Final   Neutro Abs 05/05/2021 3.1  1.7 - 7.7 K/uL Final   Lymphocytes Relative 05/05/2021 47  % Final   Lymphs Abs 05/05/2021 3.1  0.7 - 4.0 K/uL Final   Monocytes Relative 05/05/2021 5  % Final   Monocytes Absolute 05/05/2021 0.4  0.1 - 1.0 K/uL Final   Eosinophils Relative 05/05/2021 3  % Final   Eosinophils Absolute 05/05/2021 0.2  0.0 - 0.5 K/uL Final   Basophils Relative 05/05/2021 0  % Final   Basophils Absolute 05/05/2021 0.0  0.0 - 0.1 K/uL Final   Immature Granulocytes 05/05/2021 0  % Final   Abs Immature Granulocytes 05/05/2021 0.02  0.00 - 0.07 K/uL Final   Performed at [facility], [address] [place], [place] [address]   Sodium 05/05/2021 136  135 - 145 mmol/L Final   Potassium 05/05/2021 3.6  3.5 - 5.1 mmol/L Final   Chloride 05/05/2021 107  98 - 111 mmol/L Final   CO2 05/05/2021 22  22 - 32 mmol/L Final   Glucose, Bld 05/05/2021 83  70 - 99 mg/dL Final   Glucose reference range applies only to samples taken after fasting for at least 8 hours.   BUN 05/05/2021 7  6 - 20 mg/dL Final   Creatinine, Ser 05/05/2021 0.69  0.44 - 1.00 mg/dL Final   Calcium 11/91/478209/17/2022 9.3  8.9 - 10.3 mg/dL Final   Total Protein 95/62/130809/17/2022 6.8  6.5 - 8.1 g/dL Final   Albumin 65/78/469609/17/2022 3.8  3.5 - 5.0 g/dL Final   AST 29/52/841309/17/2022 17  15 - 41 U/L Final   ALT 05/05/2021 17  0 - 44 U/L Final   Alkaline Phosphatase 05/05/2021 51  38 - 126 U/L Final   Total Bilirubin 05/05/2021 1.3 (H)  0.3 - 1.2 mg/dL Final   GFR, Estimated 05/05/2021 >60  >60 mL/min Final    Comment: (NOTE) Calculated using the CKD-EPI Creatinine Equation (2021)    Anion gap 05/05/2021 7  5 - 15 Final   Performed at [facility], [address] [place], [place] [address]   Hgb A1c MFr Bld 05/05/2021 4.5 (L)  4.8 - 5.6 % Final   Comment: (NOTE) Pre diabetes:          5.7%-6.4%  Diabetes:              >6.4%  Glycemic control for   <7.0% adults with diabetes    Mean Plasma Glucose 05/05/2021 82.45  mg/dL Final   Performed at [facility], [address] [place], [place] [address]   Cholesterol 05/05/2021 121  0 - 200 mg/dL Final   Triglycerides 36/64/403409/17/2022 57  <150 mg/dL Final   HDL 74/25/956309/17/2022 52  >40 mg/dL Final   Total CHOL/HDL Ratio 05/05/2021 2.3  RATIO Final   VLDL 05/05/2021 11  0 - 40 mg/dL Final   LDL Cholesterol 05/05/2021 58  0 - 99 mg/dL Final   Comment:        Total Cholesterol/HDL:CHD Risk Coronary Heart Disease Risk Table                     Men   Women  1/2 Average Risk  3.4   3.3  Average Risk       5.0   4.4  2 X Average Risk   9.6   7.1  3 X Average Risk  23.4   11.0        Use the calculated Patient Ratio above and the CHD Risk Table to determine the patient's CHD Risk.        ATP III CLASSIFICATION (LDL):  <100     mg/dL   Optimal  462-703  mg/dL   Near or Above                    Optimal  130-159  mg/dL   Borderline  500-938  mg/dL   High  >182     mg/dL   Very High Performed at [facility], [address], [place], [place] [address]    TSH 05/05/2021 2.727  0.350 - 4.500 uIU/mL Final   Comment: Performed by a 3rd Generation assay with a functional sensitivity of <=0.01 uIU/mL. Performed at [facility], [address] [place], [place] [address]    SARS Coronavirus 2 Ag 05/05/2021 Negative  Negative Final   POC Amphetamine UR 05/05/2021 None Detected  NONE DETECTED (Cut Off Level 1000 ng/mL) Final   POC Secobarbital (BAR) 05/05/2021 None Detected  NONE DETECTED (Cut Off Level 300 ng/mL) Final   POC  Buprenorphine (BUP) 05/05/2021 None Detected  NONE DETECTED (Cut Off Level 10 ng/mL) Final   POC Oxazepam (BZO) 05/05/2021 None Detected  NONE DETECTED (Cut Off Level 300 ng/mL) Final   POC Cocaine UR 05/05/2021 None Detected  NONE DETECTED (Cut Off Level 300 ng/mL) Final   POC Methamphetamine UR 05/05/2021 None Detected  NONE DETECTED (Cut Off Level 1000 ng/mL) Final   POC Morphine 05/05/2021 None Detected  NONE DETECTED (Cut Off Level 300 ng/mL) Final   POC Oxycodone UR 05/05/2021 None Detected  NONE DETECTED (Cut Off Level 100 ng/mL) Final   POC Methadone UR 05/05/2021 None Detected  NONE DETECTED (Cut Off Level 300 ng/mL) Final   POC Marijuana UR 05/05/2021 None Detected  NONE DETECTED (Cut Off Level 50 ng/mL) Final   Preg Test, Ur 05/05/2021 NEGATIVE  NEGATIVE Final   Comment:        THE SENSITIVITY OF THIS METHODOLOGY IS >24 mIU/mL    SARSCOV2ONAVIRUS 2 AG 05/05/2021 NEGATIVE  NEGATIVE Final   Comment: (NOTE) SARS-CoV-2 antigen NOT DETECTED.   Negative results are presumptive.  Negative results do not preclude SARS-CoV-2 infection and should not be used as the sole basis for treatment or other patient management decisions, including infection  control decisions, particularly in the presence of clinical signs and  symptoms consistent with COVID-19, or in those who have been in contact with the virus.  Negative results must be combined with clinical observations, patient history, and epidemiological information. The expected result is Negative.  Fact Sheet for Patients: https://www.jennings-kim.com/  Fact Sheet for Healthcare Providers: https://alexander-rogers.biz/  This test is not yet approved or cleared by the [place] FDA and  has been authorized for detection and/or diagnosis of SARS-CoV-2 by FDA under an Emergency Use Authorization (EUA).  This EUA will remain in effect (meaning this test can be used) for the duration of  the COV                           ID-19 declaration under Section 564(b)(1) of the Act, 21 U.S.C.  section 360bbb-3(b)(1), unless the authorization is terminated or revoked sooner.    Orders Only on 04/25/2021  Component Date Value Ref Range Status   hCG,Beta Subunit,Qual,Serum 04/25/2021 Negative  Negative <6 mIU/mL Final  Clinical Support on 02/20/2021  Component Date Value Ref Range Status   Preg Test, Ur 02/20/2021 Negative  Negative Final    Blood Alcohol level:  No results found for: [facility]  Metabolic Disorder Labs: Lab Results  Component Value Date   HGBA1C 4.5 (L) 05/05/2021   MPG 82.45 05/05/2021   MPG 91 04/28/2018   Lab Results  Component Value Date   PROLACTIN 74.5 (H) 04/28/2018   Lab Results  Component Value Date   CHOL 121 05/05/2021   TRIG 57 05/05/2021   HDL 52 05/05/2021   CHOLHDL 2.3 05/05/2021   VLDL 11 05/05/2021   LDLCALC 58 05/05/2021   LDLCALC 62 04/28/2018    Therapeutic Lab Levels: No results found for: LITHIUM No results found for: VALPROATE No components found for:  CBMZ  Physical Findings   AIMS    Flowsheet Row Admission (Discharged) from OP Visit from 04/28/2018 in [place] Total Score 0      GAD-7    [place] Office Visit from 01/02/2021 in [place] Visit from 06/07/2019 in [place] Visit from 09/23/2018 in [place] at [facility]  Total GAD-7 Score 9 14 6       PHQ2-9    [place] ED from 05/04/2021 in [facility] Counselor from 04/25/2021 in [place] Office Visit from 01/02/2021 in [place] from 11/06/2020 in [place] Office Visit from 06/07/2019 in [place]  PHQ-2 Total Score 5 4 2 6 3   PHQ-9 Total Score 16 15 7 22 16       Flowsheet Row ED from 07/24/2021 in [facility] Most recent reading at 07/24/2021  2:19 PM ED from 07/23/2021 in [place] DEPT Most recent reading at 07/23/2021  8:35 PM ED from 07/23/2021 in [facility] at [facility]  Most recent reading at 07/23/2021  6:20 PM  C-SSRS RISK CATEGORY High Risk Low Risk Error: Q2 is Yes, you must answer 3, 4, and 5        Musculoskeletal  Strength & Muscle Tone: within normal limits Gait & Station: normal Patient leans: N/A  Psychiatric Specialty Exam  Presentation  General Appearance: Appropriate for Environment; Casual; Fairly Groomed  Eye Contact:Good  Speech:Normal Rate; Clear and Coherent  Speech Volume:Normal  Handedness:Right   Mood and Affect  Mood:Euthymic ("good"; rates 6/10)  Affect:Appropriate; Congruent; Full Range; Other (comment) (brighter, full range of affect noted today)   Thought Process  Thought Processes:Coherent; Goal Directed; Linear  Descriptions of Associations:Intact  Orientation:Full (Time, Place and Person)  Thought Content:WDL; Logical  Diagnosis of Schizophrenia or Schizoaffective disorder in past: Yes  Duration of Psychotic Symptoms: Greater than six months   Hallucinations:Hallucinations: None  Ideas of Reference:None  Suicidal Thoughts:Suicidal Thoughts: No  Homicidal Thoughts:Homicidal Thoughts: No   Sensorium  Memory:Immediate Good; Recent Good; Remote Good  Judgment:Good  Insight:Good   Executive Functions  Concentration:Good  Attention Span:Good  [place] of Knowledge:Good  Language:Good   Psychomotor Activity  Psychomotor Activity:Psychomotor Activity: Normal   Assets  Assets:Communication Skills; Desire for Improvement; Housing; Physical Health; Resilience; Social Support; Vocational/Educational   Sleep  Sleep:Sleep: Good   No data recorded   Physical Exam  Physical Exam Vitals and nursing note reviewed.  Constitutional:      Appearance: Normal  appearance. She is normal weight.  HENT:     Head: Normocephalic and atraumatic.  Eyes:     Extraocular Movements: Extraocular movements intact.     Conjunctiva/sclera: Conjunctivae normal.  Pulmonary:     Effort: Pulmonary effort is normal.  Neurological:     Mental Status: She is alert and oriented to person, place, and time.  Psychiatric:        Mood and Affect: Mood normal.        Behavior: Behavior normal.        Judgment: Judgment normal.   Review of Systems  Constitutional:  Negative for chills and fever.  HENT:  Negative for hearing loss.   Eyes:  Negative for discharge and redness.  Respiratory:  Negative for cough.   Cardiovascular:  Negative for chest pain.  Gastrointestinal:  Negative for abdominal pain.  Musculoskeletal:  Negative for myalgias.  Neurological:  Negative for dizziness and headaches.  Psychiatric/Behavioral:  Negative for depression, hallucinations, substance abuse and suicidal ideas. The patient is not nervous/anxious.   Blood pressure 99/60, pulse 70, temperature 97.7 F (36.5 C), temperature source Tympanic, resp. rate 16, SpO2 99 %. There is no height or weight on file to calculate BMI.  Treatment Plan Summary: 20 year old female with history of MDD and anxiety who presented to the [facility] ED on 12/5 for flulike symptoms and suicidal thoughts.  patient was unable to contract for safety after being medically cleared and was transferred to the [facility] on 12/6 for further treatment. UDS neg, labs unremarkable.    Patient denies current SI/HI/AVH on assessment today and objectively appears much brighter.  Patient rates her mood as 6 out of 10 and expresses interest in [facility] program.  Patient agreeable for continued stay at the [facility] at this time although is considering as early as tomorrow.  She remains appropriate for continued treatment at the [facility] for further stabilization   MDD with psychotic features R/o schizoaffective disorder Anxiety -continue zoloft  to 50  mg-discussed possibly increasing medication yesterday however, patient declined at this time.  Will continue to monitor sx and titrate as clinically indicated/appropriate -continue seroquel 25 mg as adjunct for mood -prn vistaril available for anxiety   Dispo: ongoing. SW to be consulted for assistance-likely discharge home with possble PHP follow up.   Ival Bible, MD 07/27/2021 1:57 PM

## 2021-07-27 NOTE — Group Note (Signed)
Group Topic: Wellness  Group Date: 07/27/2021 Start Time: 1015 End Time: 1045 Facilitators: Levander Campion  Department: [facility]  Number of Participants: 4  Group Focus: activities of daily living skills Treatment Modality:  Interpersonal Therapy Interventions utilized were patient education Purpose: enhance coping skills  Name: Gail [name] Date of Birth: Jul 01, 2001  MR: [id number]    Level of Participation: minimal Quality of Participation: attentive Interactions with others: gave feedback Mood/Affect: appropriate Triggers (if applicable): n/a Cognition: coherent/clear Progress: Moderate Response: n/a Plan: follow-up needed  Patients Problems:  Patient Active Problem List   Diagnosis Date Noted   MDD (major depressive disorder), severe (HCC) 07/24/2021   Panic 05/08/2021   MDD (major depressive disorder), recurrent, severe, 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/2021   Initiation of Depo Provera 09/24/2018   MDD (major depressive disorder), single episode, severe with psychosis (HCC) 05/03/2018   Severe major depression with psychotic features, mood-congruent (HCC) 04/28/2018   Suicide ideation 04/28/2018   GAD (generalized anxiety disorder) 04/28/2018

## 2021-07-30 ENCOUNTER — Telehealth: Payer: Self-pay | Admitting: Nurse Practitioner

## 2021-07-30 ENCOUNTER — Telehealth (HOSPITAL_COMMUNITY): Payer: Self-pay | Admitting: Professional

## 2021-07-30 NOTE — Telephone Encounter (Signed)
Pt is checking on the status of her paperwork faxed over from [place]. She mentioned a RTW date, I offered her an appointment for 07/31/21. She declined. Please advise pt at [phone].

## 2021-08-01 NOTE — Telephone Encounter (Signed)
Spoke with patient informing her we have not received fax and patient states the forms are no longer needed and this was handled for her already.

## 2021-08-03 ENCOUNTER — Telehealth (HOSPITAL_COMMUNITY): Payer: Self-pay | Admitting: Nurse Practitioner

## 2021-08-03 ENCOUNTER — Telehealth: Payer: Self-pay | Admitting: Nurse Practitioner

## 2021-08-03 NOTE — BH Assessment (Signed)
Care Management - [facility] Follow Up  Writer made contact with the patient. Patient reports that she is in contact with the [facility] therapist Alphonzo Lemmings).  Patient reports that she plans on participating in the (Partial Hospitalization Program) PHP program

## 2021-08-03 NOTE — Telephone Encounter (Signed)
Pt dropped off form to be filled out, placed in Charlotte's folder.

## 2021-08-06 ENCOUNTER — Other Ambulatory Visit (HOSPITAL_COMMUNITY): Payer: 59 | Attending: Psychiatry | Admitting: Professional

## 2021-08-06 DIAGNOSIS — F322 Major depressive disorder, single episode, severe without psychotic features: Secondary | ICD-10-CM

## 2021-08-06 DIAGNOSIS — F332 Major depressive disorder, recurrent severe without psychotic features: Secondary | ICD-10-CM | POA: Insufficient documentation

## 2021-08-06 DIAGNOSIS — Z730 Burn-out: Secondary | ICD-10-CM | POA: Insufficient documentation

## 2021-08-06 DIAGNOSIS — F439 Reaction to severe stress, unspecified: Secondary | ICD-10-CM | POA: Insufficient documentation

## 2021-08-06 DIAGNOSIS — F419 Anxiety disorder, unspecified: Secondary | ICD-10-CM | POA: Insufficient documentation

## 2021-08-06 NOTE — Telephone Encounter (Signed)
Duplicate message, this has been addressed

## 2021-08-06 NOTE — Telephone Encounter (Signed)
Pt called about paperwork being completed. Advised of declining previously offered appt. Pt said now she needs the paperwork again. I advised her appt needed. She is seeing if she can make arrangements to come in [facility] 12/21 8:30a & will call by end of day 12/19. If not call we will no longer hold appt.

## 2021-08-07 ENCOUNTER — Other Ambulatory Visit: Payer: Self-pay

## 2021-08-07 ENCOUNTER — Encounter (HOSPITAL_COMMUNITY): Payer: Self-pay | Admitting: Family

## 2021-08-07 ENCOUNTER — Encounter (HOSPITAL_COMMUNITY): Payer: Self-pay

## 2021-08-07 ENCOUNTER — Other Ambulatory Visit (HOSPITAL_COMMUNITY): Payer: 59 | Admitting: Licensed Clinical Social Worker

## 2021-08-07 ENCOUNTER — Other Ambulatory Visit (HOSPITAL_COMMUNITY): Payer: 59 | Admitting: Occupational Therapy

## 2021-08-07 ENCOUNTER — Ambulatory Visit: Payer: 59

## 2021-08-07 DIAGNOSIS — F332 Major depressive disorder, recurrent severe without psychotic features: Secondary | ICD-10-CM | POA: Diagnosis present

## 2021-08-07 DIAGNOSIS — Z730 Burn-out: Secondary | ICD-10-CM | POA: Diagnosis not present

## 2021-08-07 DIAGNOSIS — F322 Major depressive disorder, single episode, severe without psychotic features: Secondary | ICD-10-CM

## 2021-08-07 DIAGNOSIS — F439 Reaction to severe stress, unspecified: Secondary | ICD-10-CM | POA: Diagnosis not present

## 2021-08-07 DIAGNOSIS — R4589 Other symptoms and signs involving emotional state: Secondary | ICD-10-CM

## 2021-08-07 DIAGNOSIS — F419 Anxiety disorder, unspecified: Secondary | ICD-10-CM | POA: Diagnosis not present

## 2021-08-07 DIAGNOSIS — R41844 Frontal lobe and executive function deficit: Secondary | ICD-10-CM

## 2021-08-07 MED ORDER — QUETIAPINE FUMARATE 50 MG PO TABS: 50.0000 mg | ORAL_TABLET | Freq: Every day | ORAL | 0 refills | Status: DC
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
## 2021-08-07 NOTE — Therapy (Signed)
[place], [place], [address] Phone: [phone]   Fax:  [phone] Virtual Visit via Video Note  I connected with Gail [name] on 08/07/21 at 11:00 AM EST by a video enabled telemedicine application and verified that I am speaking with the correct person using two identifiers.  Location: Patient: Patient Home Provider: Home Office   I discussed the limitations of evaluation and management by telemedicine and the availability of in person appointments. The patient expressed understanding and agreed to proceed.   I discussed the assessment and treatment plan with the patient. The patient was provided an opportunity to ask questions and all were answered. The patient agreed with the plan and demonstrated an understanding of the instructions.   The patient was advised to call back or seek an in-person evaluation if the symptoms worsen or if the condition fails to improve as anticipated.  I provided 73 minutes of non-face-to-face time during this encounter. 60 minutes OT Group Therapy 13 minutes OT Evaluation  Ponciano Ort, OT   Occupational Therapy Evaluation  Patient Details  Name: Gail [name] MRN: [id number] Date of Birth: 2001-03-25 No data recorded  Encounter Date: 08/07/2021   OT End of Session - 08/07/21 1211     Visit Number 1    Number of Visits 20    Date for OT Re-Evaluation 09/04/21    Authorization Type [employer]    OT Start Time 1100   OT Eval [phone]   OT Stop Time 1200    OT Time Calculation (min) 60 min    Activity Tolerance Patient tolerated treatment well    Behavior During Therapy WFL for tasks assessed/performed             Past Medical History:  Diagnosis Date   Anxiety    Depression    Medical history non-contributory    Vision abnormalities     Past Surgical History:  Procedure Laterality Date   WISDOM TOOTH  EXTRACTION      There were no vitals filed for this visit.   Subjective Assessment - 08/07/21 1210     Currently in Pain? No/denies               OT Education - 08/07/21 1210     Education Details Educated on OT role within [facility] program in addition to tips and strategies to improve overall self-care    Person(s) Educated Patient    Methods Explanation;Handout    Comprehension Verbalized understanding              OT Short Term Goals - 08/07/21 1213       OT SHORT TERM GOAL #1   Title Pt will actively engage in OT group sessions throughout duration of PHP programming, in order to promote daily structure, social engagement, and opportunities to develop and utilize adaptive strategies to maximize functional performance in preparation for safe transition and integration back into school, work, and the community.    Time 4    Period Weeks    Status New    Target Date 09/04/21      OT SHORT TERM GOAL #2   Title Pt will identify 1-3 ways to structure her free time, in order to promote re-engagement in preferred leisure interests and establishment of a daily routine, in preparation for community reintegration.    Time 4    Period Weeks    Status New    Target Date 09/04/21  OT SHORT TERM GOAL #3   Title Pt will identify 1-3 stress management strategies she can utilize, in order to safely manage increased psychosocial stressors identified, with min cues, in preparation for safe transition back to the community at discharge.    Time 4    Period Weeks    Status New    Target Date 09/04/21           Occupational Therapy Assessment 08/07/2021  Gail [name] is a 20 y/o female with PMHx of major depression and anxiety who was referred to the [facility] program after a brief stay at the [facility] for reports of worsening depression/anxiety in the context of work and school stress. Pt reports having to take an incomplete semester at school and reports difficulty performing at her job, d/t  ongoing mental health difficulties. 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 Pt lives with her Mom and pet dog; cites several supports including Mom, two uncles, two aunts, older sister, and several friends. Pt enjoys listening to music, engaging in art, watching movies, and playing volleyball. Pt identifies school and work as her primary stressors and would like to engage in [facility] programming in order to manage identified stressors and to engage meaningfully in identified areas of occupation and ADL/iADLs. Upon approach, pt presents as calm and cooperative throughout OT evaluation. Pt reports enjoying music, drawing, watching movies, and spending time with her dog and identifies goal for admission "get coping skills".   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 Hurt myself (self-harm), Isolate from others, Cry, Talk to someone, Draw/color, and Listen to music   What helps when you are not feeling well? Taking a shower or bath, Reading, Watching TV, Playing a game, and Listening to music  What are some things that make it MORE difficult for you when you are already upset? Not having choices/input, Being alone/isolated, Not being able to express my opinion, Loud noises, Being criticized,  Boredom/Lack of activities, and Yelling  Is there anything specific that you would like help with while you're in the partial hospitalization program? Coping Skills, Relationships, Communication, Medication , Stress Management, Self-Harm Urges, Suicidal Thoughts, Self-Care, Goal-setting, Sleep, Nutrition, and Self-esteem   What is your goal while you are here?  "Get coping skills"  Assessment: Pt demonstrates behavior that inhibits/restricts participation in occupation and would benefit from skilled occupational therapy services to address current difficulties with symptom management, emotion regulation, socialization, stress management, time management, job readiness, financial wellness, health and nutrition, sleep hygiene, ADL/iADL performance and leisure participation, in preparation for reintegration and return to community at discharge.   Plan: Pt will participate in skilled occupational therapy sessions (group and/or individual) in order to promote daily structure, social engagement, and opportunities to develop and utilize adaptive strategies to maximize functional performance in  preparation for safe transition and integration back into school, work, and/or the community at discharge. OT sessions will occur 4-5 x per week for 2-4 weeks.   [name], MOT, OTR/L  Group Session:  S: "I like to make and edit my music play lists to match the different moods and emotions that I want to feel"  O: Todays group session focused on topic of self-care and group members identified their definitions of what self-care means to them, along with recognizing the difference between self-care and selfishness. Members worked through all five subcategories of self-care including physical, emotional/psychological, social, spiritual, and professional self-care. Discussion focused on members sharing which areas they need improvement in and which areas they identified as strengths.    A: Sun was active in  her participation of discussion, sharing several areas of strength and improvement when it comes to her overall self-care. Pt shared that currently, she likes to make and edit music playlists on her phone as a self-care activity under "emotional" as it is a frequently used coping strategy. She also identified areas of improvement as "sleeping regularly, learning new things/hobbies, meeting new people, and engaging in more day trips and vacations". Appeared receptive to additional support and strategies offered.   P: Continue to attend PHP OT group sessions 5x week for 2 weeks to promote daily structure, social engagement, and opportunities to develop and utilize adaptive strategies to maximize functional performance in preparation for safe transition and integration back into school, work, and the community.   Plan - 08/07/21 1212     Clinical Impression Statement Prarthana is a 20 y/o female with PMHx of major depression and anxiety who was referred to the [facility] program after a brief stay at the [facility] for reports of worsening depression/anxiety in the context of work and school stress. Pt reports having to take an incomplete semester at school and reports difficulty performing at her job, d/t ongoing mental health difficulties. 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 Pt lives with her Mom and pet dog; cites several supports including Mom, two uncles, two aunts, older sister, and several friends. Pt enjoys listening to music, engaging in art, watching movies, and playing volleyball. Pt identifies school and work as her primary stressors and would like to engage in [facility] programming in order to manage identified stressors and to engage meaningfully in identified areas of occupation and ADL/iADLs.    OT Occupational Profile and History Problem Focused Assessment - Including review of records relating to presenting problem    Occupational performance deficits (Please refer to evaluation for  details): ADL's;IADL's;Rest and Sleep;Education;Work;Leisure;Social Participation    Body Structure / Function / Physical Skills ADL    Cognitive Skills Attention;Emotional;Energy/Drive;Learn;Memory;Perception;Understand;Thought;Temperament/Personality;Safety Awareness;Problem Solve    Psychosocial Skills Coping Strategies;Environmental  Adaptations;Habits;Interpersonal Interaction;Routines and Behaviors    Rehab Potential Good    Clinical Decision Making Limited treatment options, no task modification necessary    Comorbidities Affecting Occupational Performance: May have comorbidities impacting occupational performance    Modification or Assistance to Complete Evaluation  No modification of tasks or assist necessary to complete eval    OT Frequency 5x / week    OT Duration 4 weeks    OT Treatment/Interventions Self-care/ADL training;Patient/family education;Coping strategies training;Psychosocial skills training    Consulted and Agree with Plan of Care Patient             Patient will benefit from skilled therapeutic intervention in order to improve the following  deficits and impairments:   Body Structure / Function / Physical Skills: ADL Cognitive Skills: Attention, Emotional, Energy/Drive, Learn, Memory, Perception, Understand, Thought, Temperament/Personality, Safety Awareness, Problem Solve Psychosocial Skills: Coping Strategies, Environmental  Adaptations, Habits, Interpersonal Interaction, Routines and Behaviors   Visit Diagnosis: Difficulty coping  Frontal lobe and executive function deficit  Severe episode of recurrent major depressive disorder, without psychotic features [facility])    Problem List Patient Active Problem List   Diagnosis Date Noted   MDD (major depressive disorder), severe ([place]) 07/24/2021   Panic 05/08/2021   MDD (major depressive disorder), recurrent, severe, with psychosis ([place]) 05/04/2021   Initiation of Depo Provera 09/24/2018   MDD (major  depressive disorder), single episode, severe with psychosis ([place]) 05/03/2018   Severe major depression with psychotic features, mood-congruent ([place]) 04/28/2018   Suicide ideation 04/28/2018   GAD (generalized anxiety disorder) 04/28/2018    08/07/2021  Ponciano Ort, MOT, OTR/L  08/07/2021, 12:14 PM  [place], [place], [address] Phone: [phone]   Fax:  [phone]  Name: Gail [name] MRN: [id number] Date of Birth: 07-29-2001

## 2021-08-07 NOTE — Progress Notes (Signed)
Behavioral Health Partial Program Assessment Note  Date: 08/07/2021 Name: Gail [name] MRN: [id number]  Chief Complaint: Worsening depression anxiety and stress  Subjective: Gail [name] states" I needed to learn more coping skills"   MCN:[id number] [place]- Gail [name] is a 20 y.o. Hispanic female presents with depression and visual hallucinations.  She reports a history of anxiety, depression and posttraumatic stress disorder.  With a possible schizophrenia diagnosis.  She reports chronic visual hallucinations of seeing "shadows".  States she was recently inpatient where she was prescribed Zoloft and Seroquel.  States she does not feel as if the nighttime medication is helping.  Discussed titrating Seroquel 25 milligrams to 50 mg nightly she was receptive to plan.  She denies illicit drug use or substance abuse history.  Patient was enrolled in partial psychiatric program on 08/07/21.  Primary complaints include: anxiety, depression worse, poor concentration, and problem with medication.  Onset of symptoms was gradual with gradually worsening course since that time. Psychosocial Stressors include the following: family.  Continues to endorse depressed mood, low motivation and symptoms of worry.  States she is on a leave of absence from school as she has to classes to complete by next semester.  States she is currently seeking degree in social work.  I have reviewed the following documentation dated 08/07/2021: past psychiatric history, past medical history, and past social and family history  Complaints of Pain: nonear Past Psychiatric History:  Past psychiatric hospitalizations   Currently in treatment with Zoloft 50 mg and Seroquel 25 mg  Substance Abuse History: none Use of Alcohol: denied Use of Caffeine: denies use Use of over the counter:   Past Surgical History:  Procedure Laterality Date   WISDOM TOOTH EXTRACTION      Past Medical History:  Diagnosis Date   Anxiety     Depression    Medical history non-contributory    Vision abnormalities    Outpatient Encounter Medications as of 08/07/2021  Medication Sig   QUEtiapine (SEROQUEL) 25 MG tablet Take 1 tablet (25 mg total) by mouth at bedtime.   sertraline (ZOLOFT) 50 MG tablet Take 1 tablet (50 mg total) by mouth daily.   [DISCONTINUED] pantoprazole (PROTONIX) 40 MG tablet Take 1 tablet (40 mg total) by mouth 2 (two) times daily. (Patient not taking: Reported on 06/16/2020)   [DISCONTINUED] promethazine (PHENERGAN) 25 MG tablet Take 1 tablet (25 mg total) by mouth every 6 (six) hours as needed for nausea or vomiting. (Patient not taking: Reported on 06/16/2020)   No facility-administered encounter medications on file as of 08/07/2021.   No Known Allergies  Social History   Tobacco Use   Smoking status: Never   Smokeless tobacco: Never  Substance Use Topics   Alcohol use: Yes    Comment: Rare   Functioning Relationships: good support system Education: College       Please specify degree: Seeking a degree in social work Other Pertinent History: None Family History  Problem Relation Age of Onset   Rheum arthritis Mother    Lupus Mother    Obesity Mother    Hypertension Mother    Kidney disease Mother    Healthy Sister    Breast cancer Maternal Grandmother    Cancer Maternal Grandmother        breast cancer   Diabetes Maternal Grandmother    Hypertension Maternal Grandmother    Hyperlipidemia Maternal Grandmother    Breast cancer Cousin    Diabetes Father      Review of Systems Constitutional: negative  Objective:  There were no vitals filed for this visit.  Physical Exam:   Mental Status Exam: Appearance:  Well groomed Psychomotor::  Within Normal Limits Attention span and concentration: Normal Behavior: calm, cooperative, and adequate rapport can be established Speech:  slow and soft Mood:  depressed Affect:  normal Thought Process:  Coherent Thought Content:   Logical Orientation:  person, place, and time/date Cognition:  grossly intact Insight:  Intact Judgment:  Intact Estimate of Intelligence: Average Fund of knowledge: Aware of current events Memory: Recent and remote intact Abnormal movements: None Gait and station: Normal  Assessment:  Diagnosis: No primary diagnosis found. No diagnosis found.  Indications for admission: inpatient care required if not in partial hospital program  Plan: Orders placed for occupational therapy (OT) patient enrolled in Partial Hospitalization Program, patient's current medications are to be continued, the following medications are being increased Seroquel 25 mg to 50 mg daily , a comprehensive treatment plan will be developed, and side effects of medications have been reviewed with patient  Treatment options and alternatives reviewed with patient and patient understands the above plan.  Treatment plan was reviewed and agreed upon by NP T. Melvyn Neth and patient  Ricka Burdock- Gail [name] need for group services      [name], NP

## 2021-08-08 ENCOUNTER — Other Ambulatory Visit: Payer: Self-pay

## 2021-08-08 ENCOUNTER — Encounter: Payer: Self-pay | Admitting: Nurse Practitioner

## 2021-08-08 ENCOUNTER — Other Ambulatory Visit (HOSPITAL_COMMUNITY): Payer: 59

## 2021-08-08 ENCOUNTER — Encounter (HOSPITAL_COMMUNITY): Payer: Self-pay

## 2021-08-08 ENCOUNTER — Other Ambulatory Visit (HOSPITAL_COMMUNITY): Payer: 59 | Admitting: Occupational Therapy

## 2021-08-08 ENCOUNTER — Other Ambulatory Visit (HOSPITAL_COMMUNITY): Payer: 59 | Admitting: Licensed Clinical Social Worker

## 2021-08-08 ENCOUNTER — Ambulatory Visit (INDEPENDENT_AMBULATORY_CARE_PROVIDER_SITE_OTHER): Payer: 59 | Admitting: Nurse Practitioner

## 2021-08-08 VITALS — BP 104/64 | HR 74 | Temp 97.4°F | Ht 62.0 in | Wt 104.6 lb

## 2021-08-08 DIAGNOSIS — Z3042 Encounter for surveillance of injectable contraceptive: Secondary | ICD-10-CM

## 2021-08-08 DIAGNOSIS — R4589 Other symptoms and signs involving emotional state: Secondary | ICD-10-CM

## 2021-08-08 DIAGNOSIS — R41844 Frontal lobe and executive function deficit: Secondary | ICD-10-CM

## 2021-08-08 DIAGNOSIS — F332 Major depressive disorder, recurrent severe without psychotic features: Secondary | ICD-10-CM

## 2021-08-08 DIAGNOSIS — Z23 Encounter for immunization: Secondary | ICD-10-CM

## 2021-08-08 DIAGNOSIS — F323 Major depressive disorder, single episode, severe with psychotic features: Secondary | ICD-10-CM | POA: Diagnosis not present

## 2021-08-08 DIAGNOSIS — F322 Major depressive disorder, single episode, severe without psychotic features: Secondary | ICD-10-CM

## 2021-08-08 MED ORDER — MEDROXYPROGESTERONE ACETATE 150 MG/ML IM SUSP
150.0000 mg | Freq: Once | INTRAMUSCULAR | Status: AC
  Administered 2021-08-08: 09:00:00 150 mg via INTRAMUSCULAR

## 2021-08-08 NOTE — Patient Instructions (Signed)
FMLA form needs to be completed by Psychiatrist. Maintain current medications and schedule f/up appt with psychiatry.

## 2021-08-08 NOTE — Progress Notes (Signed)
Subjective:  Patient ID: Gail [name], female    DOB: [birth year]  Age: 20 y.o. MRN: [id number]  CC: Follow-up (Pt state she needs FMLA forms filled out for work. Pt states she has been out of work since 07/22/21 due to mental health issues and she is in need of forms filled out due to this. Pt was inpatient at a mental health facility from 07/24/21-07/27/21 which has effected her mental and physical health. )  HPI  Contraception management Last depo injection administered 04/2021 Next injection administered today  Severe major depression with psychotic features, mood-congruent (HCC) Stable mood with zoloft and seroquel Under the care of psychiatrist with [facility] [employer] health counselor. She wants FMLA form completed for last hospitalization and continuous time off work.  Advised to have form to be completed by psychiatrist and maintain appts with psychiatry and psychology.  Reviewed past Medical, Social and Family history today.  Outpatient Medications Prior to Visit  Medication Sig Dispense Refill   QUEtiapine (SEROQUEL) 50 MG tablet Take 1 tablet (50 mg total) by mouth at bedtime. 30 tablet 0   sertraline (ZOLOFT) 50 MG tablet Take 1 tablet (50 mg total) by mouth daily. 30 tablet 0   No facility-administered medications prior to visit.   ROS See HPI  Objective:  BP 104/64 (BP Location: Right Arm, Patient Position: Sitting, Cuff Size: Normal)    Pulse 74    Temp (!) 97.4 F (36.3 C) (Temporal)    Ht 5\' 2"  (1.575 m)    Wt 104 lb 9.6 oz (47.4 kg)    SpO2 99%    BMI 19.13 kg/m   Physical Exam Constitutional:      General: She is not in acute distress. Neurological:     Mental Status: She is alert and oriented to person, place, and time.  Psychiatric:        Mood and Affect: Mood normal.        Behavior: Behavior normal.        Thought Content: Thought content normal.   Assessment & Plan:  This visit occurred during the SARS-CoV-2 public health  emergency.  Safety protocols were in place, including screening questions prior to the visit, additional usage of staff PPE, and extensive cleaning of exam room while observing appropriate contact time as indicated for disinfecting solutions.   Carmencita was seen today for follow-up.  Diagnoses and all orders for this visit:  Encounter for surveillance of injectable contraceptive  Flu vaccine need -     Flu Vaccine QUAD 6+ mos PF IM (Fluarix Quad PF)  Severe major depression with psychotic features, mood-congruent (HCC)  Encounter for Depo-Provera contraception -     medroxyPROGESTERone (DEPO-PROVERA) injection 150 mg   Problem List Items Addressed This Visit       Other   Contraception management - Primary    Last depo injection administered 04/2021 Next injection administered today      Severe major depression with psychotic features, mood-congruent (HCC)    Stable mood with zoloft and seroquel Under the care of psychiatrist with [facility] and 05/2021 health counselor. She wants FMLA form completed for last hospitalization and continuous time off work.  Advised to have form to be completed by psychiatrist and maintain appts with psychiatry and psychology.      Other Visit Diagnoses     Flu vaccine need       Relevant Orders   Flu Vaccine QUAD 6+ mos PF IM (Fluarix Quad PF) (Completed)  Encounter for Depo-Provera contraception       Relevant Medications   medroxyPROGESTERone (DEPO-PROVERA) injection 150 mg (Completed)       Follow-up: Return in about 5 months (around 01/06/2022) for CPE (fasting).  Alysia Penna, NP

## 2021-08-08 NOTE — Assessment & Plan Note (Signed)
Stable mood with zoloft and seroquel Under the care of psychiatrist with [facility] health counselor. She wants FMLA form completed for last hospitalization and continuous time off work.  Advised to have form to be completed by psychiatrist and maintain appts with psychiatry and psychology.

## 2021-08-08 NOTE — Therapy (Signed)
[facility] PARTIAL HOSPITALIZATION PROGRAM [address] [place], [place], [address] Phone: [phone]   Fax:  [phone] Virtual Visit via Video Note  I connected with Gail [name] on 08/08/21 at  12:00 PM EST by a video enabled telemedicine application and verified that I am speaking with the correct person using two identifiers.  Location: Patient: Patient Home Provider: Home Office   I discussed the limitations of evaluation and management by telemedicine and the availability of in person appointments. The patient expressed understanding and agreed to proceed.   I discussed the assessment and treatment plan with the patient. The patient was provided an opportunity to ask questions and all were answered. The patient agreed with the plan and demonstrated an understanding of the instructions.   The patient was advised to call back or seek an in-person evaluation if the symptoms worsen or if the condition fails to improve as anticipated.  I provided 50 minutes of non-face-to-face time during this encounter.   [name], OT   Occupational Therapy Treatment  Patient Details  Name: Gail [name] Date of Birth: 2001-06-18 Referring Provider (OT): Hillery Jacks   Encounter Date: 08/08/2021   OT End of Session - 08/08/21 1341     Visit Number 2    Number of Visits 20    Date for OT Re-Evaluation 09/04/21    Authorization Type [employer]    OT Start Time 1200    OT Stop Time 1250    OT Time Calculation (min) 50 min    Activity Tolerance Patient tolerated treatment well    Behavior During Therapy WFL for tasks assessed/performed             Past Medical History:  Diagnosis Date   Anxiety    Depression    Medical history non-contributory    Vision abnormalities     Past Surgical History:  Procedure Laterality Date   WISDOM TOOTH EXTRACTION      There were no vitals filed for  this visit.   Subjective Assessment - 08/08/21 1341     Currently in Pain? No/denies              OT Education - 08/08/21 1341     Education Details Educated on different communication styles and identified strategies/tips to practice being more assertive    Person(s) Educated Patient    Methods Explanation;Handout    Comprehension Verbalized understanding              OT Short Term Goals - 08/08/21 1342       OT SHORT TERM GOAL #1   Status On-going      OT SHORT TERM GOAL #2   Status On-going      OT SHORT TERM GOAL #3   Status On-going           Group Session:  S: "I am so bad at communication in general, part of the barrier is my language barrier/accent that people don't always understand me"  O: Todays group focused on topic of Communication Styles. Group members were educated on the different styles including passive, aggressive, and assertive communication. Members shared and reflected on which style they most often find themselves communicating in and how to transition to a more assertive approach.   A: Gail [name] was active in her participation of discussion and activity, sharing that she struggles with communication in general and one of her barriers is English being her second language. She  shared that because she has such a thick accent, it is difficult for people to understand her all of the time and she struggles to communicate her feelings effectively. She identified having good communication with Mom, however did recognize being mainly a passive communicator at times. Appeared receptive to education and information received on basic communication styles.   P: Continue to attend PHP OT group sessions 5x week for 2 weeks to promote daily structure, social engagement, and opportunities to develop and utilize adaptive strategies to maximize functional performance in preparation for safe transition and integration back into school, work, and the community.    Plan - 08/08/21 1342     Occupational performance deficits (Please refer to evaluation for details): ADL's;IADL's;Rest and Sleep;Education;Work;Leisure;Social Participation    Body Structure / Function / Physical Skills ADL    Cognitive Skills Attention;Emotional;Energy/Drive;Learn;Memory;Perception;Understand;Thought;Temperament/Personality;Safety Awareness;Problem Solve    Psychosocial Skills Coping Strategies;Environmental  Adaptations;Habits;Interpersonal Interaction;Routines and Behaviors             Patient will benefit from skilled therapeutic intervention in order to improve the following deficits and impairments:   Body Structure / Function / Physical Skills: ADL Cognitive Skills: Attention, Emotional, Energy/Drive, Learn, Memory, Perception, Understand, Thought, Temperament/Personality, Safety Awareness, Problem Solve Psychosocial Skills: Coping Strategies, Environmental  Adaptations, Habits, Interpersonal Interaction, Routines and Behaviors   Visit Diagnosis: Difficulty coping  Frontal lobe and executive function deficit  Severe episode of recurrent major depressive disorder, without psychotic features [facility])    Problem List Patient Active Problem List   Diagnosis Date Noted   MDD (major depressive disorder), severe ([place]) 07/24/2021   Panic 05/08/2021   MDD (major depressive disorder), recurrent, severe, with psychosis ([place]) 05/04/2021   Contraception management 09/24/2018   MDD (major depressive disorder), single episode, severe with psychosis ([place]) 05/03/2018   Severe major depression with psychotic features, mood-congruent ([place]) 04/28/2018   Suicide ideation 04/28/2018   GAD (generalized anxiety disorder) 04/28/2018    08/08/2021  Ponciano Ort, MOT, OTR/L  08/08/2021, 1:42 PM  Gail [name], [place], [address] Phone: [phone]   Fax:  [phone]  Name: Gail [name] MRN: [place]:[id number] Date of Birth: August 14, 2001

## 2021-08-08 NOTE — Assessment & Plan Note (Signed)
Last depo injection administered 04/2021 Next injection administered today

## 2021-08-09 ENCOUNTER — Other Ambulatory Visit (HOSPITAL_COMMUNITY): Payer: 59 | Admitting: Occupational Therapy

## 2021-08-09 ENCOUNTER — Other Ambulatory Visit (HOSPITAL_COMMUNITY): Payer: 59 | Admitting: Licensed Clinical Social Worker

## 2021-08-09 ENCOUNTER — Encounter (HOSPITAL_COMMUNITY): Payer: Self-pay

## 2021-08-09 DIAGNOSIS — F322 Major depressive disorder, single episode, severe without psychotic features: Secondary | ICD-10-CM

## 2021-08-09 DIAGNOSIS — R41844 Frontal lobe and executive function deficit: Secondary | ICD-10-CM

## 2021-08-09 DIAGNOSIS — F332 Major depressive disorder, recurrent severe without psychotic features: Secondary | ICD-10-CM | POA: Diagnosis not present

## 2021-08-09 DIAGNOSIS — R4589 Other symptoms and signs involving emotional state: Secondary | ICD-10-CM

## 2021-08-09 NOTE — Therapy (Signed)
[place], [place], [address] Phone: [phone]   Fax:  [phone] Virtual Visit via Video Note  I connected with Gail [name] on 08/09/21 at  11:00 AM EST by a video enabled telemedicine application and verified that I am speaking with the correct person using two identifiers.  Location: Patient: Patient Home Provider: Home Office   I discussed the limitations of evaluation and management by telemedicine and the availability of in person appointments. The patient expressed understanding and agreed to proceed.   I discussed the assessment and treatment plan with the patient. The patient was provided an opportunity to ask questions and all were answered. The patient agreed with the plan and demonstrated an understanding of the instructions.   The patient was advised to call back or seek an in-person evaluation if the symptoms worsen or if the condition fails to improve as anticipated.  I provided 60 minutes of non-face-to-face time during this encounter.   Ponciano Ort, OT   Occupational Therapy Treatment  Patient Details  Name: Gail [name] MRN: :[id number] Date of Birth: 2001/03/22 Referring Provider (OT): Ricky Ala   Encounter Date: 08/09/2021   OT End of Session - 08/09/21 1212     Visit Number 3    Number of Visits 20    Date for OT Re-Evaluation 09/04/21    Authorization Type [employer]    OT Start Time 1100    OT Stop Time 1200    OT Time Calculation (min) 60 min    Activity Tolerance Patient tolerated treatment well    Behavior During Therapy WFL for tasks assessed/performed             Past Medical History:  Diagnosis Date   Anxiety    Depression    Medical history non-contributory    Vision abnormalities     Past Surgical History:  Procedure Laterality Date   WISDOM TOOTH EXTRACTION      There were no vitals filed for  this visit.   Subjective Assessment - 08/09/21 1211     Currently in Pain? No/denies             OT Education - 08/09/21 1211     Education Details Educated on different communication styles with strategies to become more assertive with use of XYZ communication tool    Person(s) Educated Patient    Methods Explanation;Handout    Comprehension Verbalized understanding              OT Short Term Goals - 08/08/21 1342       OT SHORT TERM GOAL #1   Status On-going      OT SHORT TERM GOAL #2   Status On-going      OT SHORT TERM GOAL #3   Status On-going           Group Session:  S: None noted  O: Group began with a reflection from previous OT session focused on communication styles and group members re-iterated what was learned during previous session. Members shared and reflected on any opportunities they were presented with last evening to practice their assertiveness skills or recognize patterns of communication observed. Today's group focused on assertiveness skills training and use of the XYZ* assertive communication tool was introduced. The XYZ communication tool states: I feel X when you do Y in situation Z and I would like _________. X is the emotion, Y is the specific behavior, and Z  is the specific situation. Group members each formulated their own XYZ statement and shared with the group to discuss and offer feedback. Additional tips and strategies to practice being assertive were also introduced and discussed.   A: Gail [name] was moderately engaged in today's session and required min verbal cues to engage in discussion and respond to scenarios presented. Pt appeared to be receptive of information and education received, reiterating that she struggles with communication in general. Appeared responsive in recognizing different between different styles and use of the XYZ formula.   P: Continue to attend PHP OT group sessions 5x week for 2 weeks to promote daily  structure, social engagement, and opportunities to develop and utilize adaptive strategies to maximize functional performance in preparation for safe transition and integration back into school, work, and the community. Plan to address topic of sensory modulation in next OT group session.   Plan - 08/09/21 1212     Occupational performance deficits (Please refer to evaluation for details): ADL's;IADL's;Rest and Sleep;Education;Work;Leisure;Social Participation    Body Structure / Function / Physical Skills ADL    Cognitive Skills Attention;Emotional;Energy/Drive;Learn;Memory;Perception;Understand;Thought;Temperament/Personality;Safety Awareness;Problem Solve    Psychosocial Skills Coping Strategies;Environmental  Adaptations;Habits;Interpersonal Interaction;Routines and Behaviors             Patient will benefit from skilled therapeutic intervention in order to improve the following deficits and impairments:   Body Structure / Function / Physical Skills: ADL Cognitive Skills: Attention, Emotional, Energy/Drive, Learn, Memory, Perception, Understand, Thought, Temperament/Personality, Safety Awareness, Problem Solve Psychosocial Skills: Coping Strategies, Environmental  Adaptations, Habits, Interpersonal Interaction, Routines and Behaviors   Visit Diagnosis: Difficulty coping  Frontal lobe and executive function deficit  Severe episode of recurrent major depressive disorder, without psychotic features [facility])    Problem List Patient Active Problem List   Diagnosis Date Noted   MDD (major depressive disorder), severe ([facility]) 07/24/2021   Panic 05/08/2021   MDD (major depressive disorder), recurrent, severe, 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/2021   Contraception management 09/24/2018   MDD (major depressive disorder), single episode, severe with psychosis ([facility]) 05/03/2018   Severe major depression with psychotic features, mood-congruent ([facility]) 04/28/2018   Suicide ideation 04/28/2018   GAD  (generalized anxiety disorder) 04/28/2018    08/09/2021  Gail [name], MOT, OTR/L  08/09/2021, 12:12 PM  [facility] PARTIAL HOSPITALIZATION PROGRAM [address] [place], [place], [address] Phone: [phone]   Fax:  [phone]  Name: Gail [name] MRN: [id number] Date of Birth: 2000-12-21

## 2021-08-10 ENCOUNTER — Encounter (HOSPITAL_COMMUNITY): Payer: Self-pay

## 2021-08-10 ENCOUNTER — Other Ambulatory Visit: Payer: Self-pay

## 2021-08-10 ENCOUNTER — Other Ambulatory Visit (HOSPITAL_COMMUNITY): Payer: 59 | Admitting: Occupational Therapy

## 2021-08-10 ENCOUNTER — Other Ambulatory Visit (HOSPITAL_COMMUNITY): Payer: 59 | Admitting: Licensed Clinical Social Worker

## 2021-08-10 DIAGNOSIS — F332 Major depressive disorder, recurrent severe without psychotic features: Secondary | ICD-10-CM

## 2021-08-10 DIAGNOSIS — R41844 Frontal lobe and executive function deficit: Secondary | ICD-10-CM

## 2021-08-10 DIAGNOSIS — R4589 Other symptoms and signs involving emotional state: Secondary | ICD-10-CM

## 2021-08-10 NOTE — Therapy (Signed)
[place], [place], [address] Phone: [phone]   Fax:  [phone] Virtual Visit via Video Note  I connected with Denija Vantassel [name] on 08/10/21 at  12:00 PM EST by a video enabled telemedicine application and verified that I am speaking with the correct person using two identifiers.  Location: Patient: Patient Home Provider: Home Office   I discussed the limitations of evaluation and management by telemedicine and the availability of in person appointments. The patient expressed understanding and agreed to proceed.   I discussed the assessment and treatment plan with the patient. The patient was provided an opportunity to ask questions and all were answered. The patient agreed with the plan and demonstrated an understanding of the instructions.   The patient was advised to call back or seek an in-person evaluation if the symptoms worsen or if the condition fails to improve as anticipated.  I provided 60 minutes of non-face-to-face time during this encounter.   Gail [name], OT   Occupational Therapy Treatment  Patient Details  Name: Gail [name] MRN: [place]:[id number] Date of Birth: 2001-07-24 Referring Provider (OT): Ricky Ala   Encounter Date: 08/10/2021   OT End of Session - 08/10/21 1302     Visit Number 4    Number of Visits 20    Date for OT Re-Evaluation 09/04/21    Authorization Type [employer]    OT Start Time 1200    OT Stop Time 1300    OT Time Calculation (min) 60 min    Activity Tolerance Patient tolerated treatment well    Behavior During Therapy WFL for tasks assessed/performed             Past Medical History:  Diagnosis Date   Anxiety    Depression    Medical history non-contributory    Vision abnormalities     Past Surgical History:  Procedure Laterality Date   WISDOM TOOTH EXTRACTION      There were no vitals filed for  this visit.   Subjective Assessment - 08/10/21 1302     Currently in Pain? No/denies                                  OT Education - 08/10/21 1302     Education Details Educated on identifying coping strategies, social supports, and community mental health resources available through use of safety planning tool    Person(s) Educated Patient    Methods Explanation;Handout    Comprehension Verbalized understanding              OT Short Term Goals - 08/08/21 1342       OT SHORT TERM GOAL #1   Status On-going      OT SHORT TERM GOAL #2   Status On-going      OT SHORT TERM GOAL #3   Status On-going           Group Session:  S: "I like to play Just Dance or distract myself by going to any fast food restaurant that yon can sit down at"  O: Todays group discussion focused on the topic of Safety Planning. Patients were educated on what a safety plan is, what it can be used for, and why we should create one. Group then worked collaboratively and independently to create an individualized safety plan including identifying warning signs, coping strategies, places to go  for distraction, social supports, professional supports, and how to make the environment safe. Group members were also encouraged to reflect on the question What is life worth living for? Group session ended with patients encouraged to utilize their safety plan as an all-inclusive resource when experiencing a mental health crisis.    A: [name] was active in her participation of discussion and identified playing Just Dance the video game as a coping skill she could use when she feels anxious or restless. Pt also identified a fast food restaurant or her bedroom as places of distraction she could go to. Appeared receptive to use of safety planning tool and denied any concerns going into the long holiday weekend. Pt identified spending time with family celebrating Christmas and her mom's birthday  tomorrow in [place].  P: Continue to attend PHP OT group sessions 5x week for 2 weeks to promote daily structure, social engagement, and opportunities to develop and utilize adaptive strategies to maximize functional performance in preparation for safe transition and integration back into school, work, and the community.   Plan - 08/10/21 1302     Occupational performance deficits (Please refer to evaluation for details): ADL's;IADL's;Rest and Sleep;Education;Work;Leisure;Social Participation    Body Structure / Function / Physical Skills ADL    Cognitive Skills Attention;Emotional;Energy/Drive;Learn;Memory;Perception;Understand;Thought;Temperament/Personality;Safety Awareness;Problem Solve    Psychosocial Skills Coping Strategies;Environmental  Adaptations;Habits;Interpersonal Interaction;Routines and Behaviors             Patient will benefit from skilled therapeutic intervention in order to improve the following deficits and impairments:   Body Structure / Function / Physical Skills: ADL Cognitive Skills: Attention, Emotional, Energy/Drive, Learn, Memory, Perception, Understand, Thought, Temperament/Personality, Safety Awareness, Problem Solve Psychosocial Skills: Coping Strategies, Environmental  Adaptations, Habits, Interpersonal Interaction, Routines and Behaviors   Visit Diagnosis: Difficulty coping  Frontal lobe and executive function deficit  Severe episode of recurrent major depressive disorder, without psychotic features [facility])    Problem List Patient Active Problem List   Diagnosis Date Noted   MDD (major depressive disorder), severe (HCC) 07/24/2021   Panic 05/08/2021   MDD (major depressive disorder), recurrent, severe, 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/2021   Contraception management 09/24/2018   MDD (major depressive disorder), single episode, severe with psychosis (HCC) 05/03/2018   Severe major depression with psychotic features, mood-congruent (HCC) 04/28/2018    Suicide ideation 04/28/2018   GAD (generalized anxiety disorder) 04/28/2018    08/10/2021  Gail [name], MOT, OTR/L  08/10/2021, 1:03 PM  [facility] PARTIAL HOSPITALIZATION PROGRAM [address] [place], [place], [address] Phone: [phone]   Fax:  [phone]  Name: Gail [name] MRN: [id number] Date of Birth: [birth year]

## 2021-08-10 NOTE — Progress Notes (Signed)
Virtual Visit via Video Note   I connected with Gail [name] on 08/10/21 at  9:00 AM EST by a video enabled telemedicine application and verified that I am speaking with the correct person using two identifiers.   Location: Patient: Home Provider: Clinical Home Office   I discussed the limitations of evaluation and management by telemedicine and the availability of in person appointments. The patient expressed understanding and agreed to proceed.   Follow Up Instructions:     I discussed the assessment and treatment plan with the patient. The patient was provided an opportunity to ask questions and all were answered. The patient agreed with the plan and demonstrated an understanding of the instructions.   The patient was advised to call back or seek an in-person evaluation if the symptoms worsen or if the condition fails to improve as anticipated.   I provided 240 minutes of non-face-to-face time during this encounter.     Shade Flood, [place], Deon Pilling       [facility] PHP THERAPIST PROGRESS NOTE   Gail [name]  [id number]  Session Time: 9:00am - 1:00pm  Participation Level: Active   Behavioral Response: CasualAlertDepressed   Type of Therapy: CBT, MBCBT, Group Therapy, OT  Treatment Goals addressed: Coping  Suicidal/Homicidal: None; without intent/plan  Initial Therapeutic Activity: Counselor facilitated a check-in with Gail [name] to assess for safety, sobriety and medication compliance.  Counselor also inquired about Gail [name]'s current emotional ratings, as well as any significant changes in thoughts, feelings or behavior since previous check in.  Gail [name] presented for session on time and was alert, oriented x5, with no evidence or self-report of active SI/HI or A/V H.  Gail [name] reported compliance with medication and denied use of alcohol or illicit substances.  Gail [name] reported scores of 3/10 for depression, 5/10 for anxiety, and 0/10 for anger/irritability.  Gail [name]  denied any recent outbursts or panic attacks.  Gail [name] reported that a recent success was watching a favorite TV show for self-care yesterday.  Gail [name] reported that a recent struggle has been trying to prepare for Christmas, so her goal today is to finish buying some gifts later this afternoon.       Second Therapeutic Activity: Counselor discussed topic of gratitude journaling with members as a form of self-care.  Counselor virtually shared a handout with the group today which explained the benefits of this practice, including reduction in stress, increased happiness, and self-esteem.  Tips were also provided to aid in practice, such as taking time with entries, writing about people one is grateful for, and setting goal for two entries per week for at least 10-20 minutes at a time.  Counselor also provided group members with a variety of journaling prompts to choose from today, and encouraged each member to take time to write about something they are grateful for, with examples such as Something beautiful I recently saw was., Something I can be proud of is, A reason to be excited for the future is and more.  Members were encouraged to share their entry with the group, along with their perspective on the activity and motivation level towards making this a habit.  Intervention was effective, as evidenced by Gail [name] actively participating in activity and choosing to journal about the prompt Someone who I admire is.  Gail [name] reported that she admires several of her close friends, since they have been able to overcome difficult challenges, and give her feedback and support, which gives her hope for the future.  Gail [name] also reported that she  is grateful for simple things like eating chocolate covered pretzels.    Third Therapeutic Activity: Counselor provided demonstration of relaxation technique known as mindful breathing to help members increase sense of calm, resiliency, and control.  Counselor guided  members through process of getting comfortable, achieving a relaxed breathing rhythm, and focusing on this for several minutes, allowing troubling thoughts and feelings to come and go without rumination.  Counselor processed effectiveness of activity afterward in discussion with members, including how this impacted their mental state, whether it was difficult to stay focused, and if they plan to include it in self-care routine to improve day-to-day coping.   Intervention effectiveness could not be measured, as Gail [name] did not respond to inquiry about her experience with this activity.      Plan: Pt will continue in PHP while working to decrease depression symptoms, increase emotion regulation, and increase ability to manage symptoms in a healthy manner.    Diagnosis:      MDD (major depressive disorder), recurrent, severe, without psychosis (HCC) [F33.2]                          1. MDD (major depressive disorder), recurrent, severe, without psychosis (HCC)       [name], [place], LCAS 08/10/2021

## 2021-08-13 ENCOUNTER — Other Ambulatory Visit (HOSPITAL_COMMUNITY): Payer: 59

## 2021-08-14 ENCOUNTER — Other Ambulatory Visit (HOSPITAL_COMMUNITY): Payer: 59

## 2021-08-14 ENCOUNTER — Other Ambulatory Visit: Payer: Self-pay

## 2021-08-15 ENCOUNTER — Other Ambulatory Visit: Payer: Self-pay

## 2021-08-15 ENCOUNTER — Encounter (HOSPITAL_COMMUNITY): Payer: Self-pay | Admitting: Professional

## 2021-08-15 ENCOUNTER — Other Ambulatory Visit (HOSPITAL_COMMUNITY): Payer: 59 | Admitting: Professional

## 2021-08-15 ENCOUNTER — Other Ambulatory Visit (HOSPITAL_COMMUNITY): Payer: 59 | Admitting: Occupational Therapy

## 2021-08-15 ENCOUNTER — Encounter (HOSPITAL_COMMUNITY): Payer: Self-pay

## 2021-08-15 DIAGNOSIS — F332 Major depressive disorder, recurrent severe without psychotic features: Secondary | ICD-10-CM | POA: Diagnosis not present

## 2021-08-15 DIAGNOSIS — F322 Major depressive disorder, single episode, severe without psychotic features: Secondary | ICD-10-CM

## 2021-08-15 DIAGNOSIS — R41844 Frontal lobe and executive function deficit: Secondary | ICD-10-CM

## 2021-08-15 DIAGNOSIS — R4589 Other symptoms and signs involving emotional state: Secondary | ICD-10-CM

## 2021-08-15 NOTE — Progress Notes (Signed)
BH MD/PA/NP OP Progress Note  08/15/2021 12:09 PM Gail [name]  MRN:  [id number]  Chief Complaint:  Chief Complaint   Depression; Anxiety    Evaluation: Gail [name] was seen and evaluated via WebEx.  She is awake alert and oriented x3.  Presents with a bright and pleasant affect.  Denying suicidal or homicidal ideations.  Continues to endorse "seeing shadows".  She reports she is medication compliant.  Denying any medication side effects.  She reported increased symptoms of worry related to the end of the year stressors.  States " I have not done anything that I  needed  to do with my classes for this semester and  I don't know if I have a job anymore."  She denied a plan to follow-up with current reported stressors.  Gail [name] reported low motivation, and worsening depression.  Rates her depression 6 out of 10 with 10 being the worst during this assessment.  Patient to continue daily group session.  Anticipated discharge early next week.  Support, encouragement and reassurance was provided.    Visit Diagnosis:    ICD-10-CM   1. MDD (major depressive disorder), severe (HCC)  F32.2       Past Psychiatric History:   Past Medical History:  Past Medical History:  Diagnosis Date   Anxiety    Depression    Medical history non-contributory    Vision abnormalities     Past Surgical History:  Procedure Laterality Date   WISDOM TOOTH EXTRACTION      Family Psychiatric History:   Family History:  Family History  Problem Relation Age of Onset   Rheum arthritis Mother    Lupus Mother    Obesity Mother    Hypertension Mother    Kidney disease Mother    Healthy Sister    Breast cancer Maternal Grandmother    Cancer Maternal Grandmother        breast cancer   Diabetes Maternal Grandmother    Hypertension Maternal Grandmother    Hyperlipidemia Maternal Grandmother    Breast cancer Cousin    Diabetes Father     Social History:  Social History   Socioeconomic  History   Marital status: Single    Spouse name: Not on file   Number of children: Not on file   Years of education: Not on file   Highest education level: Not on file  Occupational History   Not on file  Tobacco Use   Smoking status: Never   Smokeless tobacco: Never  Vaping Use   Vaping Use: Never used  Substance and Sexual Activity   Alcohol use: Yes    Comment: Rare   Drug use: Yes    Frequency: 3.0 times per week    Types: Marijuana   Sexual activity: Not Currently    Birth control/protection: Injection    Comment: 1st intercourse- 16, partners- 2,   Other Topics Concern   Not on file  Social History Narrative   Not on file   Social Determinants of Health   Financial Resource Strain: Not on file  Food Insecurity: Not on file  Transportation Needs: Not on file  Physical Activity: Not on file  Stress: Not on file  Social Connections: Not on file    Allergies: No Known Allergies  Metabolic Disorder Labs: Lab Results  Component Value Date   HGBA1C 4.5 (L) 05/05/2021   MPG 82.45 05/05/2021   MPG 91 04/28/2018   Lab Results  Component Value Date   PROLACTIN 74.5 (H)  04/28/2018   Lab Results  Component Value Date   CHOL 121 05/05/2021   TRIG 57 05/05/2021   HDL 52 05/05/2021   CHOLHDL 2.3 05/05/2021   VLDL 11 05/05/2021   LDLCALC 58 05/05/2021   LDLCALC 62 04/28/2018   Lab Results  Component Value Date   TSH 2.727 05/05/2021   TSH 1.96 01/02/2021    Therapeutic Level Labs: No results found for: LITHIUM No results found for: VALPROATE No components found for:  CBMZ  Current Medications: Current Outpatient Medications  Medication Sig Dispense Refill   QUEtiapine (SEROQUEL) 50 MG tablet Take 1 tablet (50 mg total) by mouth at bedtime. 30 tablet 0   sertraline (ZOLOFT) 50 MG tablet Take 1 tablet (50 mg total) by mouth daily. 30 tablet 0   No current facility-administered medications for this visit.     Musculoskeletal:  Psychiatric Specialty  Exam: Review of Systems  Psychiatric/Behavioral:  Negative for behavioral problems and hallucinations. The patient is nervous/anxious.   All other systems reviewed and are negative.  There were no vitals taken for this visit.There is no height or weight on file to calculate BMI.  General Appearance: Casual  Eye Contact:  Good  Speech:  Clear and Coherent  Volume:  Normal  Mood:  Anxious and Depressed  Affect:  Congruent  Thought Process:  Coherent  Orientation:  Full (Time, Place, and Person)  Thought Content: Logical   Suicidal Thoughts:  No  Homicidal Thoughts:  No  Memory:  Immediate;   Fair Recent;   Fair  Judgement:  Fair  Insight:  Fair  Psychomotor Activity:  Normal  Concentration:  Concentration: Good  Recall:  Good  Fund of Knowledge: Good  Language: Good  Akathisia:  No  Handed:  Right  AIMS (if indicated): done  Assets:  Communication Skills Desire for Improvement Social Support  ADL's:  Intact  Cognition: WNL  Sleep:  Fair   Screenings: AIMS    Flowsheet Row Ad[facility] (Discharged) [facility]  AIMS Total Score 0      GAD-7    Flowsheet Row Office Visit from 01/02/2021 in [facility] Office Visit from 06/07/2019 in [facility] Office Visit from 09/23/2018 in [facility] at [facility]  Total GAD-7 Score 9 14 6       PHQ2-9    Flowsheet Row Counselor from 08/06/2021 in [facility]AL [facility] PROGRAM ED from 05/04/2021 in [facility] Counselor from 04/25/2021 in BEHAVIORAL HEALTH INTENSIVE [facility] Office Visit from 01/02/2021 in [facility] Counselor from 11/06/2020 in BEHAVIORAL HEALTH INTENSIVE [facility]  PHQ-2 Total Score 6 5 4 2 6   PHQ-9 Total Score 27 16 15 7 22       Flowsheet Row Counselor from 08/06/2021 in BEHAVIORAL [facility]ALTH PARTIAL [facility]OSPITALIZATION PROGRAM ED from 07/24/2021  in [facility] ED from 07/23/2021 in [facility]  C-SSRS RISK CATEGORY Low Risk High Risk Low Risk        Assessment and Plan:  Patient to continue with partial hospitalization programming Continue medications as indicated  Treatment plan was reviewed and agreed upon by NP [facility] and patient 14/12/2020- [place] need for group service.    Melvyn Neth, NP 08/15/2021, 12:09 PM

## 2021-08-15 NOTE — Psych (Signed)
Virtual Visit via Video Note  I connected with Gail [name] on 08/08/21 at 9a by a video enabled telemedicine application and verified that I am speaking with the correct person using two identifiers.  Location: Patient: Home Provider: Clinical Home Office   I discussed the limitations of evaluation and management by telemedicine and the availability of in person appointments. The patient expressed understanding and agreed to proceed.  Follow Up Instructions:    I discussed the assessment and treatment plan with the patient. The patient was provided an opportunity to ask questions and all were answered. The patient agreed with the plan and demonstrated an understanding of the instructions.   The patient was advised to call back or seek an in-person evaluation if the symptoms worsen or if the condition fails to improve as anticipated.  I provided 240 minutes of non-face-to-face time during this encounter.   Quinn Axe, [facility]    [facility] BH PHP THERAPIST PROGRESS NOTE  Emanuela Runnion [name] [id number]  Session Time: 9-10  Participation Level: Active  Behavioral Response: CasualAlertAnxious and Depressed  Type of Therapy: Group Therapy  Treatment Goals addressed: Coping  Interventions: CBT, DBT, Solution Focused, Strength-based, Supportive, and Reframing  Summary:  Clinician led check-in regarding current stressors and situation. Clinician utilized active listening and empathetic response and validated patient emotions. Clinician facilitated processing group on pertinent issues.  Therapist Response: Jasmyne Lodato [name] is a 20 y.o. female who presents with depression and anxiety symptoms. Patient arrived within time allowed and reports that she is feeling OK but tired." Patient rates her mood at a 3 on a scale of 1-10 with 10 being great. Pt reports lack of sleep and physical pain. Pt reports continuing to "over think" school related issues. Pt  able to process. Pt engaged in discussion.  Session Time: 10:00 - 11:00   Participation Level: Active   Behavioral Response: CasualAlertDepressed   Type of Therapy: Group Therapy   Treatment Goals addressed: Coping   Interventions: CBT, DBT, Supportive and Reframing   Summary: Cln led discussion on meeting new people and developing more supports once discharged from group. Group discussed how to create these supports.    Therapist Response:  Pt engaged in discussion and reports she wants to reach out to resources to get continued support after discharge.        Session Time: 11:00- 12:00   Participation Level: Active   Behavioral Response: CasualAlertDepressed   Type of Therapy: Group Therapy   Treatment Goals addressed: Coping   Interventions: Supportive   Summary: Spiritual care group   Therapist Response: Pt engaged in discussion. See chaplain note       Session Time: 12:00- 1:00   Participation Level: Active   Behavioral Response: CasualAlertDepressed   Type of Therapy: Group Therapy, OT   Treatment Goals addressed: Coping   Interventions: Psychosocial skills training, Supportive   Summary: 12:00-12:50: Occupational Therapy group 12:50 -1:00 Clinician led check-out. Clinician assessed for immediate needs, medication compliance and efficacy, and safety concerns   Therapist Response: 12:00-12:50 Patient engaged in group. See OT note.   12:50 - 1:00: At check-out, patient rates her mood at a 5on a scale of 1-10 with 10 being great. Pt reports afternoon plans of laundry. Pt demonstrates some progress as evidenced by increased mood. Patient denies SI/HI at the end of group.   Suicidal/Homicidal: Nowithout intent/plan  Plan: Pt will continue in PHP while working to decrease depression symptoms, increase emotion regulation, and increase ability to manage symptoms  in a healthy manner  Diagnosis: MDD (major depressive disorder), severe (HCC) [F32.2]    1. MDD  (major depressive disorder), severe (HCC)       Quinn Axe, [facility] 08/08/21

## 2021-08-15 NOTE — Psych (Deleted)
Comprehensive Clinical Assessment (CCA) Note  08/15/2021 Gail [name] [id number]  Chief Complaint:  Chief Complaint  Patient presents with   Depression   Anxiety   Visit Diagnosis: ***    CCA Screening, Triage and Referral (STR)  Patient Reported Information How did you hear about [place]? Hospital Discharge  Referral name: [facility]  Referral phone number: No data recorded  Whom do you see for routine medical problems? Primary Care  Practice/Facility Name: [facility]  Practice/Facility Phone Number: No data recorded Name of Contact: No data recorded Contact Number: No data recorded Contact Fax Number: No data recorded Prescriber Name: No data recorded Prescriber Address (if known): No data recorded  What Is the Reason for Your Visit/Call Today? follow up depression, anxiety, SI  How Long Has This Been Causing You Problems? > than 6 months  What Do You Feel Would Help You the Most Today? Treatment for Depression or other mood problem   Have You Recently Been in Any Inpatient Treatment (Hospital/Detox/Crisis Center/28-Day Program)? Yes  Name/Location of Program/Hospital:[facility]  How Long Were You There? 3  When Were You Discharged? 07/27/21   Have You Ever Received Services From [employer] Before? Yes  Who Do You See at [facility]? Felicity (2019)   Have You Recently Had Any Thoughts About Hurting Yourself? Yes (Pt reports she has thoughts of hurting herself with a knife. Pt reports access to sharp kitchen knives. Pt agrees to let cln speak with Mother Francesca Jewett) to ask for knives to be secured. Mother agrees)  Are You Planning to [place] At This time? No   Have you Recently Had Thoughts About [place]? No  Explanation: No data recorded  Have You Used Any Alcohol or Drugs in the Past 24 Hours? No  How Long Ago Did You Use Drugs or Alcohol? No data recorded What Did You Use and How Much? Pt states she vapes  CBD approximately 2x/week   Do You Currently Have a Therapist/Psychiatrist? Yes  Name of Therapist/Psychiatrist: Therapist: Jaye Beagle; Psychiatrist: [facility] - doesn't remember name at moment   Have You Been Recently Discharged From Any [facility] or Programs? No  Explanation of Discharge From Practice/Program: No data recorded    CCA Screening Triage Referral Assessment Type of Contact: Tele-Assessment  Is this Initial or Reassessment? Initial Assessment  Date Telepsych consult ordered in [facility]:  07/23/21  Time Telepsych consult ordered in [facility]:  No data recorded  Patient Reported Information Reviewed? Yes  Patient Left Without Being Seen? No data recorded Reason for Not Completing Assessment: No data recorded  Collateral Involvement: notes; mom   Does Patient Have a Court Appointed Legal Guardian? No data recorded Name and Contact of Legal Guardian: No data recorded If Minor and Not Living with Parent(s), Who has Custody? N/A  Is CPS involved or ever been involved? Never  Is APS involved or ever been involved? Never   Patient Determined To Be At Risk for Harm To Self or Others Based on Review of Patient Reported Information or Presenting Complaint? No  Method: No data recorded Availability of Means: No data recorded Intent: No data recorded Notification Required: No data recorded Additional Information for Danger to Others Potential: No data recorded Additional Comments for Danger to Others Potential: No data recorded Are There Guns or Other Weapons in Your Home? No data recorded Types of Guns/Weapons: No data recorded Are These Weapons Safely Secured?  No data recorded Who Could Verify You Are Able To Have These Secured: No data recorded Do You Have any Outstanding Charges, Pending Court Dates, Parole/Probation? No data recorded Contacted To Inform of Risk of Harm To Self or Others: Family/Significant Other: (Pt's mother  is aware)   Location of Assessment: Other (comment)   Does Patient Present under Involuntary Commitment? No  IVC Papers Initial File Date: No data recorded  [place] of Residence: [place]   Patient Currently Receiving the Following Services: Individual Therapy; Medication Management   Determination of Need: Urgent (48 hours)   Options For Referral: Partial Hospitalization     CCA Biopsychosocial Intake/Chief Complaint:  Pt reports from [facility]. Pt was recently in IOP. Pt reports constant pSI and SH thoughts. Pt reports current VH of seeing shadows. Pt reports current thoughts of using a knife to hurt self. Pt reports access to sharp kitchen knives. Pt agrees to let cln speak with Mother Francesca Jewett) to ask for knives to be secured. Mother agrees. Pt reports self-harm history; last time 2 days ago. Pt reports cutting self on wrist; no medical attention needed. Pt reports I have tried to kill myself in the past but have stopped myself. Mostly by taking pills or stabbing myself. Pt reports last time was earlier this year; unable to identify how many times. Pt reports stressors: 1) Future: thoughts about the future and being unsure of what that looks like. 2) School: Pt reports she stresses due to workload and not having enough notice about assignments done. Pt reports communication issues with teachers.  Pt reports Ive been trying to get help but theres not a lot they Restaurant manager, fast food of students) can do if the teacher wont respond. 3) Family: Pt reports family argues, boundaries are not respected. Pt reports she lives with Mom, but grandma is staying with them currently for holiday. 4) Work: Pt reports too much work and not enough time to complete it. Pt reports lack of support from management. Pt reports she is too tired from work to complete school.  Pt reports my symptoms are keeping me from doing my routine at work and with school. Pt report triggers include baby crying and people being  mean. Pt reports increase in sweating and pain in back over the last month. Pt reports increase in shower due to sweating; decrease in cleaning/cooking. Pt reports no motivation to get out of bed. Pt identifies supports: Mom, 2 uncles, 2 aunts, sister Pt reports family history: Sister: Bipolar; PTSD in others  Current Symptoms/Problems: SI/SH; Sadness, very anxious, isolative, poor concentration, anhedonia, low energy, no motivation, tearfulness, racing thoughts, decreased sleep, decreased appetite, Vhallucinations ("Right now it's just seeing shadows.")  Denies commanding voices."; "feeling like my life is worthless"; body aches;   Patient Reported Schizophrenia/Schizoaffective Diagnosis in Past: Yes   Strengths: Pt is able to express her mental health concerns;  Pt is currently employed. She is close with her mother.  Preferences: to gain coping skills  Abilities: Speaks English very well.   Type of Services Patient Feels are Needed: PHP   Initial Clinical Notes/Concerns: Work schedule with attending MH-IOP and school on top of this may add more stress.   Mental Health Symptoms Depression:   Change in energy/activity; Difficulty Concentrating; Fatigue; Increase/decrease in appetite; Sleep (too much or little); Tearfulness; Worthlessness; Hopelessness; Irritability   Duration of Depressive symptoms:  Greater than two weeks   Mania:   N/A   Anxiety:    Worrying; Sleep; Tension; Difficulty concentrating; Irritability;  Fatigue   Psychosis:   Hallucinations (Pt reports seeing shadows for the past week)   Duration of Psychotic symptoms:  Less than six months   Trauma:   N/A   Obsessions:   None   Compulsions:   N/A   Inattention:   N/A   Hyperactivity/Impulsivity:   N/A   Oppositional/Defiant Behaviors:   N/A   Emotional Irregularity:   Potentially harmful impulsivity; Recurrent suicidal behaviors/gestures/threats; Mood lability; Chronic feelings of  emptiness; Unstable self-image   Other Mood/Personality Symptoms:   None noted    Mental Status Exam Appearance and self-care  Stature:   Average   Weight:   Average weight   Clothing:   Casual   Grooming:   Normal   Cosmetic use:   None   Posture/gait:   Normal   Motor activity:   Not Remarkable   Sensorium  Attention:   Normal   Concentration:   Normal   Orientation:   X5   Recall/memory:   Normal   Affect and Mood  Affect:   Depressed   Mood:   Depressed   Relating  Eye contact:   Normal   Facial expression:   Responsive   Attitude toward examiner:   Cooperative   Thought and Language  Speech flow:  Normal   Thought content:   Appropriate to Mood and Circumstances   Preoccupation:   None   Hallucinations:   Visual (Pt reports seeing shadows over the last week)   Organization:  No data recorded  [employer] of Knowledge:   Average   Intelligence:   Average   Abstraction:   Concrete   Judgement:   Poor   Reality Testing:   Adequate   Insight:   Gaps   Decision Making:   Only simple   Social Functioning  Social Maturity:   Isolates   Social Judgement:   Normal   Stress  Stressors:   School; Work; Family conflict; Financial   Coping Ability:   Overwhelmed; Exhausted   Skill Deficits:   Interpersonal; Self-control; Activities of daily living   Supports:   Family; Friends/Service system     Religion:    Leisure/Recreation:    Exercise/Diet:     CCA Employment/Education Employment/Work Situation:    Education:     CCA Family/Childhood History Family and Relationship History:    Childhood History:     Child/Adolescent Assessment:     CCA Substance Use Alcohol/Drug Use:                           ASAM's:  Six Dimensions of Multidimensional Assessment  Dimension 1:  Acute Intoxication and/or Withdrawal Potential:      Dimension 2:  Biomedical  Conditions and Complications:      Dimension 3:  Emotional, Behavioral, or Cognitive Conditions and Complications:     Dimension 4:  Readiness to Change:     Dimension 5:  Relapse, Continued use, or Continued Problem Potential:     Dimension 6:  Recovery/Living Environment:     ASAM Severity Score:    ASAM Recommended Level of Treatment:     Substance use Disorder (SUD)    Recommendations for Services/Supports/Treatments:    DSM5 Diagnoses: Patient Active Problem List   Diagnosis Date Noted   MDD (major depressive disorder), severe (HCC) 07/24/2021   Panic 05/08/2021   MDD (major depressive disorder), recurrent, severe, 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/2021   Contraception management 09/24/2018  MDD (major depressive disorder), single episode, severe with psychosis ([place]) 05/03/2018   Severe major depression with psychotic features, mood-congruent ([place]) 04/28/2018   Suicide ideation 04/28/2018   GAD (generalized anxiety disorder) 04/28/2018    Patient Centered Plan: Patient is on the following Treatment Plan(s):  {[facility] AMB BH OP Treatment Plans:[id number]}   Referrals to Alternative Service(s): Referred to Alternative Service(s):   Place:   Date:   Time:    Referred to Alternative Service(s):   Place:   Date:   Time:    Referred to Alternative Service(s):   Place:   Date:   Time:    Referred to Alternative Service(s):   Place:   Date:   Time:     Royetta Crochet, [facility]

## 2021-08-15 NOTE — Psych (Signed)
Virtual Visit via Video Note  I connected with Gail [name] on 08/09/21 at 9a by a video enabled telemedicine application and verified that I am speaking with the correct person using two identifiers.  Location: Patient: Home Provider: Clinical Home Office   I discussed the limitations of evaluation and management by telemedicine and the availability of in person appointments. The patient expressed understanding and agreed to proceed.  Follow Up Instructions:    I discussed the assessment and treatment plan with the patient. The patient was provided an opportunity to ask questions and all were answered. The patient agreed with the plan and demonstrated an understanding of the instructions.   The patient was advised to call back or seek an in-person evaluation if the symptoms worsen or if the condition fails to improve as anticipated.  I provided 240 minutes of non-face-to-face time during this encounter.   Gail [name], [facility]    [facility] BH PHP THERAPIST PROGRESS NOTE  Gail [name] [id number]  Session Time: 9-10  Participation Level: Active  Behavioral Response: CasualAlertAnxious and Depressed  Type of Therapy: Group Therapy  Treatment Goals addressed: Coping  Interventions: CBT, DBT, Solution Focused, Strength-based, Supportive, and Reframing  Summary:  Clinician led check-in regarding current stressors and situation. Clinician utilized active listening and empathetic response and validated patient emotions. Clinician facilitated processing group on pertinent issues.  Therapist Response: Gail [name] is a 20 y.o. female who presents with depression and anxiety symptoms. Patient arrived within time allowed and reports that she is feeling a little better." Patient rates her mood at a 6 on a scale of 1-10 with 10 being great. Pt reports she was able to take a walk and clear her mind yesterday. Pt reports talking to sister about  what is going on with pt. Pt able to process. Pt engaged in discussion.  Session Time: 10:00 - 11:00   Participation Level: Active   Behavioral Response: CasualAlertDepressed   Type of Therapy: Group Therapy   Treatment Goals addressed: Coping   Interventions: CBT, DBT, Supportive and Reframing   Summary: Clinician introduced topic of "Radical Acceptance."  Group discussed how and when radical acceptance can be used and helpful.    Therapist Response:  Pt engaged in discussion and reports she can see radical acceptance being helpful in lost relationships from the past that she continues to dwell on.          Session Time: 12:00- 1:00   Participation Level: Active   Behavioral Response: CasualAlertDepressed   Type of Therapy: Group Therapy, OT   Treatment Goals addressed: Coping   Interventions: Psychosocial skills training, Supportive   Summary: Occupational Therapy group   Therapist Response: Pt engaged in discussion. See OT note       Session Time: 12:00- 1:00   Participation Level: Active   Behavioral Response: CasualAlertDepressed   Type of Therapy: Group Therapy   Treatment Goals addressed: Coping   Interventions: CBT, DBT, Supportive and Reframing   Summary: 12:00-12:50: Clinician introduced "Mindfulness". Clinician showed Ted[employer] on mindfulness and the group discussed. Patients identified what types of activities would help them practice mindfulness. 12:50 -1:00 Clinician led check-out. Clinician assessed for immediate needs, medication compliance and efficacy, and safety concerns   Therapist Response: 12:00-12:50 Patient engaged in discussion. Pt reports needing to be more mindful when bathing her dog.   12:50 - 1:00: At check-out, patient rates her mood at a 7 on a scale of 1-10 with 10 being great. Pt reports afternoon  plans of participating in self-care by doing arts/crafts. Pt demonstrates some progress as evidenced by increased mood and willingness  to participate in self-care activities. Patient denies SI/HI at the end of group.   Suicidal/Homicidal: Nowithout intent/plan  Plan: Pt will continue in PHP while working to decrease depression symptoms, increase emotion regulation, and increase ability to manage symptoms in a healthy manner  Diagnosis: MDD (major depressive disorder), severe (HCC) [F32.2]    1. MDD (major depressive disorder), severe (HCC)       Gail [name], [facility] 08/09/21

## 2021-08-15 NOTE — Psych (Signed)
Virtual Visit via Video Note  I connected with Gail [name] on 08/15/21 at 9a by a video enabled telemedicine application and verified that I am speaking with the correct person using two identifiers.  Location: Patient: Home Provider: Clinical Home Office   I discussed the limitations of evaluation and management by telemedicine and the availability of in person appointments. The patient expressed understanding and agreed to proceed.  Follow Up Instructions:    I discussed the assessment and treatment plan with the patient. The patient was provided an opportunity to ask questions and all were answered. The patient agreed with the plan and demonstrated an understanding of the instructions.   The patient was advised to call back or seek an in-person evaluation if the symptoms worsen or if the condition fails to improve as anticipated.  I provided 240 minutes of non-face-to-face time during this encounter.   Gail [name], [facility]    [facility] BH PHP THERAPIST PROGRESS NOTE  Gail [name] [id number]  Session Time: 9-10  Participation Level: Active  Behavioral Response: CasualAlertAnxious and Depressed  Type of Therapy: Group Therapy  Treatment Goals addressed: Coping  Interventions: CBT, DBT, Solution Focused, Strength-based, Supportive, and Reframing  Summary:  Clinician led check-in regarding current stressors and situation. Clinician utilized active listening and empathetic response and validated patient emotions. Clinician facilitated processing group on pertinent issues.  Therapist Response: Gail [name] is a 20 y.o. female who presents with depression and anxiety symptoms. Patient arrived within time allowed and reports that she is feeling not so good." Patient rates her mood at a 3 on a scale of 1-10 with 10 being great. Pt reports continued issues with "over thinking." Pt able to process. Pt engaged in discussion.  Session  Time: 10:00 - 11:00   Participation Level: Active   Behavioral Response: CasualAlertDepressed   Type of Therapy: Group Therapy   Treatment Goals addressed: Coping   Interventions: CBT, DBT, Supportive and Reframing   Summary: Cln led discussion on the CBT cognitive distortion, emotional reasoning. Group members discussed ways they struggle to move past their feelings or confuse feelings with reason. Cln offered context for cognitive distortions and thought challenging as a way to manage the distorted thoughts    Therapist Response:  Pt engaged in discussion and reports she wants to do better to catch, challenge, and change cognitive distortions.        Session Time: 11:00- 12:00   Participation Level: Active   Behavioral Response: CasualAlertDepressed   Type of Therapy: Group Therapy   Treatment Goals addressed: Coping   Interventions: Supportive   Summary: Spiritual care group   Therapist Response: Pt engaged in discussion. See chaplain note       Session Time: 12:00- 1:00   Participation Level: Active   Behavioral Response: CasualAlertDepressed   Type of Therapy: Group Therapy, OT   Treatment Goals addressed: Coping   Interventions: Psychosocial skills training, Supportive   Summary: 12:00-12:50: Occupational Therapy group 12:50 -1:00 Clinician led check-out. Clinician assessed for immediate needs, medication compliance and efficacy, and safety concerns   Therapist Response: 12:00-12:50 Patient engaged in group. See OT note.   12:50 - 1:00: At check-out, patient rates her mood at a 5 on a scale of 1-10 with 10 being great. Pt reports afternoon plans of school work. Pt demonstrates some progress as evidenced by increased willingness to use skills to handle symptoms. Patient denies SI/HI at the end of group.   Suicidal/Homicidal: Nowithout intent/plan  Plan: Pt will  continue in PHP while working to decrease depression symptoms, increase emotion regulation, and  increase ability to manage symptoms in a healthy manner  Diagnosis: MDD (major depressive disorder), severe (HCC) [F32.2]    1. MDD (major depressive disorder), severe (HCC)       Gail [name], [facility] 08/15/21

## 2021-08-15 NOTE — Therapy (Signed)
[place], [place], [address] Phone: [phone]   Fax:  [phone] Virtual Visit via Video Note  I connected with Gail [name] on 08/15/21 at  12:00 PM EST by a video enabled telemedicine application and verified that I am speaking with the correct person using two identifiers.  Location: Patient: Patient Home Provider: Home Office   I discussed the limitations of evaluation and management by telemedicine and the availability of in person appointments. The patient expressed understanding and agreed to proceed.   I discussed the assessment and treatment plan with the patient. The patient was provided an opportunity to ask questions and all were answered. The patient agreed with the plan and demonstrated an understanding of the instructions.   The patient was advised to call back or seek an in-person evaluation if the symptoms worsen or if the condition fails to improve as anticipated.  I provided 45 minutes of non-face-to-face time during this encounter.   Gail [name], OT   Occupational Therapy Treatment  Patient Details  Name: Gail [name] MRN: [id number]:[id number] Date of Birth: 2001/02/22 Referring Provider (OT): Gail [name]   Encounter Date: 08/15/2021   OT End of Session - 08/15/21 1245     Visit Number 5    Number of Visits 20    Date for OT Re-Evaluation 09/04/21    Authorization Type [employer]    OT Start Time 1200    OT Stop Time 1245    OT Time Calculation (min) 45 min    Activity Tolerance Patient tolerated treatment well    Behavior During Therapy WFL for tasks assessed/performed             Past Medical History:  Diagnosis Date   Anxiety    Depression    Medical history non-contributory    Vision abnormalities     Past Surgical History:  Procedure Laterality Date   WISDOM TOOTH EXTRACTION      There were no vitals filed for  this visit.   Subjective Assessment - 08/15/21 1245     Currently in Pain? No/denies                                  OT Education - 08/15/21 1245     Education Details Educated on personal strengths/exploration and brainstormed ways in which we could utilize our strengths as a strategy to manage our mental health, in regards to personal fulfillment, professional life, and within our relationships.    Person(s) Educated Patient    Methods Explanation;Handout    Comprehension Verbalized understanding              OT Short Term Goals - 08/08/21 1342       OT SHORT TERM GOAL #1   Status On-going      OT SHORT TERM GOAL #2   Status On-going      OT SHORT TERM GOAL #3   Status On-going           Group Session:  S: "creativity, artistic ability, love of learning, fairness, kindness, love, cooperation, curiosity, and intelligence"  O: Todays group session focused on topic of strength and explored personal strengths an individual possesses. Group members were given a strengths exploration handout and identified from the list what strengths they currently have and why they are considered a strength. Discussion focused on identifying how we can  use our strengths to manage our mental health, while also exploring use of our strengths for our personal fulfillment, professional life, and in relationships.    A: Gail [name] was active and independent in her participation of discussion, identifying her top 10 strengths and how she utilizes them in different areas of her life including professionally and personally. 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   P: Continue to attend PHP OT group sessions 5x week for 1 weeks to promote daily structure, social engagement, and opportunities to develop and utilize adaptive strategies to maximize functional performance in preparation for safe transition and integration back into school, work, and the community. Plan to address topic of sensory modulation in next OT group session.   Plan - 08/15/21 1246     Occupational performance deficits (Please refer to evaluation for details): ADL's;IADL's;Rest and Sleep;Education;Work;Leisure;Social Participation    Body Structure / Function / Physical Skills ADL    Cognitive Skills Attention;Emotional;Energy/Drive;Learn;Memory;Perception;Understand;Thought;Temperament/Personality;Safety Awareness;Problem Solve    Psychosocial Skills Coping Strategies;Environmental  Adaptations;Habits;Interpersonal Interaction;Routines and Behaviors             Patient will benefit from skilled therapeutic intervention in order to improve the following deficits and impairments:   Body Structure / Function / Physical Skills: ADL Cognitive Skills: Attention, Emotional, Energy/Drive, Learn, Memory, Perception, Understand, Thought, Temperament/Personality, Safety Awareness, Problem Solve Psychosocial Skills: Coping Strategies, Environmental  Adaptations, Habits, Interpersonal Interaction, Routines and Behaviors   Visit Diagnosis: Difficulty coping  Frontal lobe and executive function deficit  Severe episode of recurrent major depressive disorder, without psychotic features [facility])    Problem List Patient Active Problem List   Diagnosis Date Noted   MDD (major depressive disorder), severe (HCC) 07/24/2021   Panic 05/08/2021   MDD (major depressive disorder),  recurrent, severe, 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/2021   Contraception management 09/24/2018   MDD (major depressive disorder), single episode, severe with psychosis (HCC) 05/03/2018   Severe major depression with psychotic features, mood-congruent (HCC) 04/28/2018   Suicide ideation 04/28/2018   GAD (generalized anxiety disorder) 04/28/2018    08/15/2021  [name], MOT, OTR/L  08/15/2021, 12:46 PM  [facility] PARTIAL HOSPITALIZATION PROGRAM [address] [place], [place], [address] Phone: [phone]   Fax:  [phone]  Name: Gail [name] MRN: [id number] Date of Birth: 2001-04-23

## 2021-08-16 ENCOUNTER — Other Ambulatory Visit: Payer: Self-pay

## 2021-08-16 ENCOUNTER — Other Ambulatory Visit (HOSPITAL_COMMUNITY): Payer: 59

## 2021-08-16 ENCOUNTER — Other Ambulatory Visit (HOSPITAL_COMMUNITY): Payer: 59 | Admitting: Licensed Clinical Social Worker

## 2021-08-16 DIAGNOSIS — F332 Major depressive disorder, recurrent severe without psychotic features: Secondary | ICD-10-CM

## 2021-08-16 NOTE — Progress Notes (Signed)
Virtual Visit via Video Note   I connected with Gail [name] on 08/16/21 at  9:00 AM EST by a video enabled telemedicine application and verified that I am speaking with the correct person using two identifiers.   Location: Patient: Home Provider: OPT [place] Office   I discussed the limitations of evaluation and management by telemedicine and the availability of in person appointments. The patient expressed understanding and agreed to proceed.   Follow Up Instructions:     I discussed the assessment and treatment plan with the patient. The patient was provided an opportunity to ask questions and all were answered. The patient agreed with the plan and demonstrated an understanding of the instructions.   The patient was advised to call back or seek an in-person evaluation if the symptoms worsen or if the condition fails to improve as anticipated.   I provided 240 minutes of non-face-to-face time during this encounter.     Shade Flood, [place], Deon Pilling       [facility] PHP THERAPIST PROGRESS NOTE   Gail [name]  [place]:[id number]  Session Time: 9:00am - 1:00pm   Participation Level: Active   Behavioral Response: CasualAlertDepressed   Type of Therapy: CBT, psychoeducation, Group Therapy, OT  Treatment Goals addressed: Coping  Suicidal/Homicidal: None; without intent/plan   Initial Therapeutic Activity: Counselor facilitated a check-in with Gail [name] to assess for safety, sobriety and medication compliance.  Counselor also inquired about Gail [name]'s current emotional ratings, as well as any significant changes in thoughts, feelings or behavior since previous check in.  Gail [name] presented for session on time and was alert, oriented x5, with no evidence or self-report of active SI/HI or A/V H.  Gail [name] reported compliance with medication and denied use of alcohol or illicit substances.  Gail [name] reported scores of 5/10 for depression, 7/10 for anxiety, and 0/10 for anger/irritability.   Gail [name] denied any recent outbursts or panic attacks.  She reported that a recent struggle has been ruminating on some stressors in her life such as school, which can trigger panic attacks at times.  Gail [name] reported that a success has been using deep breathing to try and ground herself when experiencing panic symptoms, and taking advice from [facility] counselor on strategies for breaking down goals into smaller, more realistic tasks in order to reduce related anxiety.     Second Therapeutic Activity: Counselor engaged the group in discussion on managing work/life balance today to improve mental health and wellness.  Counselor explained how finding balance between responsibilities at home and work place can be challenging, lead to increased stress, and this has been further complicated by recent pandemic leading to unemployment, more virtual work, and blurring of lines between home as a place of rest or work duties.  Counselor facilitated discussion on what challenges members have faced with this issue historically, as well as what, if any, issues have arisen following pandemic.  Counselor also discussed strategies for improving work/life balance while members work on their mental health during treatment.  Some of these included keeping track of time management; creating a list of priorities and scaling importance; setting realistic, measurable goals each day; establishing boundaries; taking care of health needs; and nurturing relationships at home and work for support.  Counselor inquired about areas where members feel they are excelling, as well as areas they could focus on during treatment.   Intervention was effective, as evidenced by Gail [name] actively engaging in discussion on subject, reporting that she has been dealing with signs of burnout for some time  now, which is one reason she entered PHP.  Gail [name] reported that some of her burnout warning signs included feeling tired and drained, getting reduced sleep, having  less motivation each day, feeling overwhelmed frequently, and withdrawing from responsibilities.  Gail [name] reported that she needs to set a more realistic boundary in order to improve her work life balance while enrolled in school, since she spends much of the day worrying about whether she forgot to do assignments, or did something wrong, which exacerbates her anxiety, and influences her panic episodes.  Gail [name] was receptive to several strategies offered, such as creating a to-do list each day to break down goals into more manageable tasks, make an effort to only do school/work related tasks during specific times of day to ensure adequate self-care time, and become more assertive to effectively say No to excessive responsibilities.      Plan: Pt will continue in PHP while working to decrease depression symptoms, increase emotion regulation, and increase ability to manage symptoms in a healthy manner.    Diagnosis:      MDD (major depressive disorder), recurrent, severe, without psychosis (HCC) [F33.2]                          1. MDD (major depressive disorder), recurrent, severe, without psychosis (HCC)       [name], LCSW, LCAS 08/16/2021

## 2021-08-17 ENCOUNTER — Other Ambulatory Visit (HOSPITAL_COMMUNITY): Payer: 59 | Admitting: Occupational Therapy

## 2021-08-17 ENCOUNTER — Encounter (HOSPITAL_COMMUNITY): Payer: Self-pay

## 2021-08-17 ENCOUNTER — Other Ambulatory Visit (HOSPITAL_COMMUNITY): Payer: 59 | Admitting: Licensed Clinical Social Worker

## 2021-08-17 DIAGNOSIS — F332 Major depressive disorder, recurrent severe without psychotic features: Secondary | ICD-10-CM | POA: Diagnosis not present

## 2021-08-17 DIAGNOSIS — R41844 Frontal lobe and executive function deficit: Secondary | ICD-10-CM

## 2021-08-17 DIAGNOSIS — R4589 Other symptoms and signs involving emotional state: Secondary | ICD-10-CM

## 2021-08-17 NOTE — Therapy (Signed)
[facility] PARTIAL HOSPITALIZATION PROGRAM [address] [place], [place], [address] Phone: [phone]   Fax:  [phone] Virtual Visit via Video Note  I connected with Gail [name] on 08/17/21 at  10:45 AM EST by a video enabled telemedicine application and verified that I am speaking with the correct person using two identifiers.  Location: Patient: Patient Home Provider: Clinical Home Office   I discussed the limitations of evaluation and management by telemedicine and the availability of in person appointments. The patient expressed understanding and agreed to proceed.   I discussed the assessment and treatment plan with the patient. The patient was provided an opportunity to ask questions and all were answered. The patient agreed with the plan and demonstrated an understanding of the instructions.   The patient was advised to call back or seek an in-person evaluation if the symptoms worsen or if the condition fails to improve as anticipated.  I provided 40 minutes of non-face-to-face time during this encounter.   [name], OT   Occupational Therapy Treatment  Patient Details  Name: Gail [name] MRN: [id number] Date of Birth: 01-Jan-2001 Referring Provider (OT): Hillery Jacks   Encounter Date: 08/17/2021   OT End of Session - 08/17/21 1140     Visit Number 6    Number of Visits 20    Date for OT Re-Evaluation 09/04/21    Authorization Type [employer]    OT Start Time 1045    OT Stop Time 1125    OT Time Calculation (min) 40 min    Activity Tolerance Patient tolerated treatment well    Behavior During Therapy WFL for tasks assessed/performed             Past Medical History:  Diagnosis Date   Anxiety    Depression    Medical history non-contributory    Vision abnormalities     Past Surgical History:  Procedure Laterality Date   WISDOM TOOTH EXTRACTION      There were no vitals  filed for this visit.   Subjective Assessment - 08/17/21 1140     Currently in Pain? No/denies                                  OT Education - 08/17/21 1140     Education Details Educated on goal-setting strategies and goal-setting with use of the SMART goal framework    Person(s) Educated Patient    Methods Explanation;Handout    Comprehension Verbalized understanding              OT Short Term Goals - 08/08/21 1342       OT SHORT TERM GOAL #1   Status On-going      OT SHORT TERM GOAL #2   Status On-going      OT SHORT TERM GOAL #3   Status On-going           Group Session:  S: "I am 50/50 when it comes to creating goals and achieving them"  O: Todays group session encouraged engagement and participation through discussion focused on goal setting. Group members were introduced to goal-setting using the SMART Goal framework, identifying goals as Specific, Measureable, Acheivable, Relevant, and Time-Bound. Group members took time from group to create their own personal goal reflecting the SMART goal template and shared for review by peers and OT.     A: Gail [name] was active in  her participation of discussion and activity, sharing that in the past, her goal-setting has been 50/50 with follow through and achieving them or not. Pt was receptive to SMART framework and identified two goals during session, one for personal life and one treatment focused. Pt identified following SMART goals: "I will put in a job application every two days for a month" and "I will go to group sessions 3-5x a week for a month". Pt shared that both goals are relevant, as she is wanting a new job that better fits her school schedule and attending group therapy is important to her and her mental health treatment. Appeared receptive to additional feedback and education.  P: Continue to attend PHP OT group sessions 5x week for 1 weeks to promote daily structure, social engagement,  and opportunities to develop and utilize adaptive strategies to maximize functional performance in preparation for safe transition and integration back into school, work, and the community.    Plan - 08/17/21 1141     Occupational performance deficits (Please refer to evaluation for details): ADL's;IADL's;Rest and Sleep;Education;Work;Leisure;Social Participation    Body Structure / Function / Physical Skills ADL    Cognitive Skills Attention;Emotional;Energy/Drive;Learn;Memory;Perception;Understand;Thought;Temperament/Personality;Safety Awareness;Problem Solve    Psychosocial Skills Coping Strategies;Environmental  Adaptations;Habits;Interpersonal Interaction;Routines and Behaviors             Patient will benefit from skilled therapeutic intervention in order to improve the following deficits and impairments:   Body Structure / Function / Physical Skills: ADL Cognitive Skills: Attention, Emotional, Energy/Drive, Learn, Memory, Perception, Understand, Thought, Temperament/Personality, Safety Awareness, Problem Solve Psychosocial Skills: Coping Strategies, Environmental  Adaptations, Habits, Interpersonal Interaction, Routines and Behaviors   Visit Diagnosis: Difficulty coping  Frontal lobe and executive function deficit  Severe episode of recurrent major depressive disorder, without psychotic features [facility])    Problem List Patient Active Problem List   Diagnosis Date Noted   MDD (major depressive disorder), severe ([place]) 07/24/2021   Panic 05/08/2021   MDD (major depressive disorder), recurrent, severe, with psychosis ([place]) 05/04/2021   Contraception management 09/24/2018   MDD (major depressive disorder), single episode, severe with psychosis ([place]) 05/03/2018   Severe major depression with psychotic features, mood-congruent ([place]) 04/28/2018   Suicide ideation 04/28/2018   GAD (generalized anxiety disorder) 04/28/2018    08/17/2021  Ponciano Ort, MOT,  OTR/L  08/17/2021, 11:41 AM  [place], [place], [address] Phone: [phone]   Fax:  [phone]  Name: Gail [name] MRN: :[id number] Date of Birth: 04/28/2001

## 2021-08-21 ENCOUNTER — Other Ambulatory Visit: Payer: Self-pay

## 2021-08-21 ENCOUNTER — Encounter (HOSPITAL_COMMUNITY): Payer: Self-pay

## 2021-08-21 ENCOUNTER — Other Ambulatory Visit (HOSPITAL_COMMUNITY): Payer: 59

## 2021-08-21 ENCOUNTER — Other Ambulatory Visit (HOSPITAL_COMMUNITY): Payer: 59 | Admitting: Occupational Therapy

## 2021-08-21 ENCOUNTER — Other Ambulatory Visit (HOSPITAL_COMMUNITY): Payer: 59 | Attending: Psychiatry | Admitting: Licensed Clinical Social Worker

## 2021-08-21 DIAGNOSIS — R41844 Frontal lobe and executive function deficit: Secondary | ICD-10-CM

## 2021-08-21 DIAGNOSIS — F332 Major depressive disorder, recurrent severe without psychotic features: Secondary | ICD-10-CM

## 2021-08-21 DIAGNOSIS — F4329 Adjustment disorder with other symptoms: Secondary | ICD-10-CM | POA: Diagnosis not present

## 2021-08-21 DIAGNOSIS — R4589 Other symptoms and signs involving emotional state: Secondary | ICD-10-CM

## 2021-08-21 NOTE — Progress Notes (Signed)
Spoke with patient via Webex video call, used 2 identifiers to correctly identify patient. States that she was referred by [facility] after an inpatient stay for depression with suicidal thoughts. Was feeling overwhelmed and did not feel safe at home so she went to be seen for inpatient. She has passive SI at times but no plan or intent. Denies SI today. Denies HI today but when she gets mad or upset she does think about it. She has had HI towards a co-worker and her sister in the past. When she gets upset she also hears "chattering voices." Denies AV hallucinations today. Sleeping OK since increasing her Seroquel at night. No side effects. Has been in IOP in the past. Enjoying groups and has no issues or complaints. No side effects from medications.

## 2021-08-21 NOTE — Therapy (Signed)
[place], [place], [address] Phone: [phone]   Fax:  [phone] Virtual Visit via Video Note  I connected with Gail [name] on 08/21/21 at  11:05 AM EST by a video enabled telemedicine application and verified that I am speaking with the correct person using two identifiers.  Location: Patient: Patient Home Provider: Home Office   I discussed the limitations of evaluation and management by telemedicine and the availability of in person appointments. The patient expressed understanding and agreed to proceed.   I discussed the assessment and treatment plan with the patient. The patient was provided an opportunity to ask questions and all were answered. The patient agreed with the plan and demonstrated an understanding of the instructions.   The patient was advised to call back or seek an in-person evaluation if the symptoms worsen or if the condition fails to improve as anticipated.  I provided 50 minutes of non-face-to-face time during this encounter.   Ponciano Ort, OT   Occupational Therapy Treatment  Patient Details  Name: Gail [name] MRN: [id number] Date of Birth: 2000-11-12 Referring Provider (OT): Ricky Ala   Encounter Date: 08/21/2021   OT End of Session - 08/21/21 1203     Visit Number 7    Number of Visits 20    Date for OT Re-Evaluation 09/04/21    Authorization Type [employer]    OT Start Time 1105    OT Stop Time 1155    OT Time Calculation (min) 50 min    Activity Tolerance Patient tolerated treatment well    Behavior During Therapy WFL for tasks assessed/performed             Past Medical History:  Diagnosis Date   Anxiety    Depression    Medical history non-contributory    Vision abnormalities     Past Surgical History:  Procedure Laterality Date   WISDOM TOOTH EXTRACTION      There were no vitals filed for  this visit.   Subjective Assessment - 08/21/21 1203     Currently in Pain? No/denies             OT Education - 08/21/21 1203     Education Details Educated on concept of sensory modulation and self-soothing as coping strategies through use of the eight senses    Person(s) Educated Patient    Methods Explanation;Handout    Comprehension Verbalized understanding              OT Short Term Goals - 08/08/21 1342       OT SHORT TERM GOAL #1   Status On-going      OT SHORT TERM GOAL #2   Status On-going      OT SHORT TERM GOAL #3   Status On-going           Group Session:  S: "I like to take a hot shower and listen to the sound of the water."  O: Todays group session focused on topic of sensory modulation and self-soothing through use of the 8 senses. Discussion introduced the concept of sensory modulation and integration, focusing on how we can utilize our body and its senses to self-soothe or cope, when we are experiencing an over or under-whelming sensation or feeling. Group members were introduced to a sensory diet checklist as a helpful tool/resource that can be utilized to identify what activities and strategies we prefer and do not prefer based  upon our response to different stimulus. The concept of alerting vs calming activities was also introduced to understand how to counteract how we are feeling (Example: when we are feeling overwhelmed/stressed, engage in something calming. When we are feeling depressed/low energy, engage in something alerting). Group members engaged actively in discussion sharing their own personal sensory likes/dislikes.    A: Gail [name] was active in her participation of discussion and activity, sharing self-soothing activities that were both calming and alerting to her. Calming activities included "taking a hot shower, petting animals (in general), listening to rain, a fan, or music, looking at art work, baking/cooking, and eating milk  chocolate. Alerting activities included "going on amusement park rides, doing arts and crafts, going to plays/theater, watching horror or action movies, reading, and eating spicy foods. Pt also identified triggers or activities that do not serve benefit including "lifting weights, going barefoot, silence, wearing sunglasses, drinking tea, and the smell of flowers". Pt expressed enjoyment with activity and noted benefit of further use.   P: Continue to attend PHP OT group sessions 5x week for the remainder of the week to promote daily structure, social engagement, and opportunities to develop and utilize adaptive strategies to maximize functional performance in preparation for safe transition and integration back into school, work, and the community. P  Plan - 08/21/21 1204     Occupational performance deficits (Please refer to evaluation for details): ADL's;IADL's;Rest and Sleep;Education;Work;Leisure;Social Participation    Body Structure / Function / Physical Skills ADL    Cognitive Skills Attention;Emotional;Energy/Drive;Learn;Memory;Perception;Understand;Thought;Temperament/Personality;Safety Awareness;Problem Solve    Psychosocial Skills Coping Strategies;Environmental  Adaptations;Habits;Interpersonal Interaction;Routines and Behaviors             Patient will benefit from skilled therapeutic intervention in order to improve the following deficits and impairments:   Body Structure / Function / Physical Skills: ADL Cognitive Skills: Attention, Emotional, Energy/Drive, Learn, Memory, Perception, Understand, Thought, Temperament/Personality, Safety Awareness, Problem Solve Psychosocial Skills: Coping Strategies, Environmental  Adaptations, Habits, Interpersonal Interaction, Routines and Behaviors   Visit Diagnosis: Difficulty coping  Frontal lobe and executive function deficit  Severe episode of recurrent major depressive disorder, without psychotic features [facility])    Problem  List Patient Active Problem List   Diagnosis Date Noted   MDD (major depressive disorder), severe ([place]) 07/24/2021   Panic 05/08/2021   MDD (major depressive disorder), recurrent, severe, with psychosis ([place]) 05/04/2021   Contraception management 09/24/2018   MDD (major depressive disorder), single episode, severe with psychosis ([place]) 05/03/2018   Severe major depression with psychotic features, mood-congruent ([place]) 04/28/2018   Suicide ideation 04/28/2018   GAD (generalized anxiety disorder) 04/28/2018   08/21/2021  Ponciano Ort, MOT, OTR/L   [place] [address] [place], [place], [address] Phone: [phone]   Fax:  [phone]  Name: Gail [name] MRN: [id number] Date of Birth: 2000/10/12

## 2021-08-22 ENCOUNTER — Ambulatory Visit (HOSPITAL_COMMUNITY): Payer: 59

## 2021-08-22 ENCOUNTER — Other Ambulatory Visit (HOSPITAL_COMMUNITY): Payer: 59 | Admitting: Licensed Clinical Social Worker

## 2021-08-22 ENCOUNTER — Other Ambulatory Visit (HOSPITAL_COMMUNITY): Payer: 59

## 2021-08-22 DIAGNOSIS — F332 Major depressive disorder, recurrent severe without psychotic features: Secondary | ICD-10-CM

## 2021-08-23 ENCOUNTER — Other Ambulatory Visit: Payer: Self-pay

## 2021-08-23 ENCOUNTER — Ambulatory Visit (HOSPITAL_COMMUNITY): Payer: 59

## 2021-08-23 ENCOUNTER — Other Ambulatory Visit (HOSPITAL_COMMUNITY): Payer: 59 | Admitting: Licensed Clinical Social Worker

## 2021-08-23 ENCOUNTER — Other Ambulatory Visit (HOSPITAL_COMMUNITY): Payer: 59 | Admitting: Occupational Therapy

## 2021-08-23 ENCOUNTER — Encounter (HOSPITAL_COMMUNITY): Payer: Self-pay

## 2021-08-23 DIAGNOSIS — R41844 Frontal lobe and executive function deficit: Secondary | ICD-10-CM

## 2021-08-23 DIAGNOSIS — F332 Major depressive disorder, recurrent severe without psychotic features: Secondary | ICD-10-CM

## 2021-08-23 DIAGNOSIS — R4589 Other symptoms and signs involving emotional state: Secondary | ICD-10-CM

## 2021-08-23 DIAGNOSIS — F4329 Adjustment disorder with other symptoms: Secondary | ICD-10-CM | POA: Diagnosis not present

## 2021-08-23 DIAGNOSIS — F411 Generalized anxiety disorder: Secondary | ICD-10-CM

## 2021-08-23 NOTE — Therapy (Signed)
[place], [place], [address] Phone: [phone]   Fax:  [phone] Virtual Visit via Video Note  I connected with Gail [name] on 08/23/21 at  11:00 AM EST by a video enabled telemedicine application and verified that I am speaking with the correct person using two identifiers.  Location: Patient: Patient Home Provider: Home Office   I discussed the limitations of evaluation and management by telemedicine and the availability of in person appointments. The patient expressed understanding and agreed to proceed.   I discussed the assessment and treatment plan with the patient. The patient was provided an opportunity to ask questions and all were answered. The patient agreed with the plan and demonstrated an understanding of the instructions.   The patient was advised to call back or seek an in-person evaluation if the symptoms worsen or if the condition fails to improve as anticipated.  I provided 60 minutes of non-face-to-face time during this encounter.   Gail [name], OT   Occupational Therapy Treatment  Patient Details  Name: Gail [name] MRN: [id number] Date of Birth: Oct 05, 2000 Referring Provider (OT): Ricky Ala   Encounter Date: 08/23/2021   OT End of Session - 08/23/21 1219     Visit Number 8    Number of Visits 20    Date for OT Re-Evaluation 09/04/21    Authorization Type [employer]    OT Start Time 1105    OT Stop Time 1205    OT Time Calculation (min) 60 min    Activity Tolerance Patient tolerated treatment well    Behavior During Therapy WFL for tasks assessed/performed             Past Medical History:  Diagnosis Date   Anxiety    Depression    Medical history non-contributory    Vision abnormalities     Past Surgical History:  Procedure Laterality Date   WISDOM TOOTH EXTRACTION      There were no vitals filed for  this visit.   Subjective Assessment - 08/23/21 1219     Currently in Pain? No/denies                                  OT Education - 08/23/21 1219     Education Details Educated on the 5 Fs and provided resources/strategies/tips to improve overall health and wellness    Person(s) Educated Patient    Methods Explanation;Handout    Comprehension Verbalized understanding              OT Short Term Goals - 08/08/21 1342       OT SHORT TERM GOAL #1   Status On-going      OT SHORT TERM GOAL #2   Status On-going      OT SHORT TERM GOAL #3   Status On-going           Group Session:  S: None noted*  O: Todays group session focused on the topic of health and wellness as it relates to the impact on mental health. Discussion focused on identifying the 5 Fs to wellness including Food, Fitness, [employer], Fellowship, and Friendship with self and soul. Group members identified areas of wellness that they would like to improve upon and were educated and offered various resources. Discussion also focused on how the food we eat impacts our mental health, along with the  benefits of engaging in physical activity/exercise and getting outside for fresh air. Discussion wrapped up with group members identifying one area of wellness they could improve upon and identified a strategy to do so.    A: Gail [name] was moderately engaged in today's session, though more withdrawn than previously encounters. Pt reported feeling physically nauseous and unwell, which was contributing to mood and energy levels. Pt appeared receptive to powerpoint and education surrounding wellness including physical, emotional, and mental aspects. Pt identified struggling with appetite at times, going to extreme of eating too much or too little, though proved receptive to strategies offered. Despite lack of verbal engagement, did appear receptive and interested in information received.   P: Continue to  attend PHP OT group sessions 5x week for the remainder of the week to promote daily structure, social engagement, and opportunities to develop and utilize adaptive strategies to maximize functional performance in preparation for safe transition and integration back into school, work, and the community.    Plan - 08/23/21 1220     Occupational performance deficits (Please refer to evaluation for details): ADL's;IADL's;Rest and Sleep;Education;Work;Leisure;Social Participation    Body Structure / Function / Physical Skills ADL    Cognitive Skills Attention;Emotional;Energy/Drive;Learn;Memory;Perception;Understand;Thought;Temperament/Personality;Safety Awareness;Problem Solve    Psychosocial Skills Coping Strategies;Environmental  Adaptations;Habits;Interpersonal Interaction;Routines and Behaviors             Patient will benefit from skilled therapeutic intervention in order to improve the following deficits and impairments:   Body Structure / Function / Physical Skills: ADL Cognitive Skills: Attention, Emotional, Energy/Drive, Learn, Memory, Perception, Understand, Thought, Temperament/Personality, Safety Awareness, Problem Solve Psychosocial Skills: Coping Strategies, Environmental  Adaptations, Habits, Interpersonal Interaction, Routines and Behaviors   Visit Diagnosis: Difficulty coping  Frontal lobe and executive function deficit  Severe episode of recurrent major depressive disorder, without psychotic features [facility])    Problem List Patient Active Problem List   Diagnosis Date Noted   MDD (major depressive disorder), severe ([place]) 07/24/2021   Panic 05/08/2021   MDD (major depressive disorder), recurrent, severe, with psychosis ([place]) 05/04/2021   Contraception management 09/24/2018   MDD (major depressive disorder), single episode, severe with psychosis ([place]) 05/03/2018   Severe major depression with psychotic features, mood-congruent ([place]) 04/28/2018   Suicide ideation  04/28/2018   GAD (generalized anxiety disorder) 04/28/2018   08/23/2021  Gail [name], MOT, OTR/L   [place] [address] [place], [place], [address] Phone: [phone]   Fax:  [phone]  Name: Gail [name] MRN: [id number] Date of Birth: 2001/02/12

## 2021-08-24 ENCOUNTER — Ambulatory Visit (HOSPITAL_COMMUNITY): Payer: 59

## 2021-08-24 ENCOUNTER — Other Ambulatory Visit: Payer: Self-pay

## 2021-08-24 ENCOUNTER — Telehealth (HOSPITAL_COMMUNITY): Payer: Self-pay | Admitting: Professional

## 2021-08-24 ENCOUNTER — Other Ambulatory Visit (HOSPITAL_COMMUNITY): Payer: 59 | Admitting: Occupational Therapy

## 2021-08-24 ENCOUNTER — Encounter (HOSPITAL_COMMUNITY): Payer: Self-pay

## 2021-08-24 ENCOUNTER — Other Ambulatory Visit (HOSPITAL_COMMUNITY): Payer: 59 | Admitting: Licensed Clinical Social Worker

## 2021-08-24 DIAGNOSIS — F4329 Adjustment disorder with other symptoms: Secondary | ICD-10-CM | POA: Diagnosis not present

## 2021-08-24 DIAGNOSIS — R4589 Other symptoms and signs involving emotional state: Secondary | ICD-10-CM

## 2021-08-24 DIAGNOSIS — F332 Major depressive disorder, recurrent severe without psychotic features: Secondary | ICD-10-CM

## 2021-08-24 DIAGNOSIS — F411 Generalized anxiety disorder: Secondary | ICD-10-CM

## 2021-08-24 DIAGNOSIS — F322 Major depressive disorder, single episode, severe without psychotic features: Secondary | ICD-10-CM

## 2021-08-24 DIAGNOSIS — R41844 Frontal lobe and executive function deficit: Secondary | ICD-10-CM

## 2021-08-24 NOTE — Therapy (Signed)
[place], [place], [address] Phone: [phone]   Fax:  [phone] Virtual Visit via Video Note  I connected with Gail [name] on 08/24/21 at  11:00 AM EST by a video enabled telemedicine application and verified that I am speaking with the correct person using two identifiers.  Location: Patient: Patient Home Provider: Home Office   I discussed the limitations of evaluation and management by telemedicine and the availability of in person appointments. The patient expressed understanding and agreed to proceed.   I discussed the assessment and treatment plan with the patient. The patient was provided an opportunity to ask questions and all were answered. The patient agreed with the plan and demonstrated an understanding of the instructions.   The patient was advised to call back or seek an in-person evaluation if the symptoms worsen or if the condition fails to improve as anticipated.  I provided 60 minutes of non-face-to-face time during this encounter.   Ponciano Ort, OT   Occupational Therapy Treatment  Patient Details  Name: Gail [name] MRN: [id number] Date of Birth: August 16, 2001 Referring Provider (OT): Ricky Ala   Encounter Date: 08/24/2021   OT End of Session - 08/24/21 1205     Visit Number 9    Number of Visits 20    Date for OT Re-Evaluation 09/04/21    Authorization Type [employer]    OT Start Time 1100    OT Stop Time 1200    OT Time Calculation (min) 60 min    Activity Tolerance Patient tolerated treatment well    Behavior During Therapy WFL for tasks assessed/performed             Past Medical History:  Diagnosis Date   Anxiety    Depression    Medical history non-contributory    Vision abnormalities     Past Surgical History:  Procedure Laterality Date   WISDOM TOOTH EXTRACTION      There were no vitals filed for  this visit.   Subjective Assessment - 08/24/21 1205     Currently in Pain? No/denies                                  OT Education - 08/24/21 1205     Education Details Educated on fight-or-flight response and use of relaxation strategies including deep breathing, guided imagery, and PMR    Person(s) Educated Patient    Methods Explanation;Handout    Comprehension Verbalized understanding              OT Short Term Goals - 08/24/21 1206       OT SHORT TERM GOAL #1   Title Pt will actively engage in OT group sessions throughout duration of PHP programming, in order to promote daily structure, social engagement, and opportunities to develop and utilize adaptive strategies to maximize functional performance in preparation for safe transition and integration back into school, work, and the community.    Status Partially Met      OT SHORT TERM GOAL #2   Title Pt will identify 1-3 ways to structure her free time, in order to promote re-engagement in preferred leisure interests and establishment of a daily routine, in preparation for community reintegration.    Status Partially Met      OT SHORT TERM GOAL #3   Title Pt will identify 1-3 stress management strategies  she can utilize, in order to safely manage increased psychosocial stressors identified, with min cues, in preparation for safe transition back to the community at discharge.    Status Partially Met           Group Session:  S: "I like to watch TV or read a book"  O:  Group began with a warm-up activity and group members were encouraged to share and identify ways in which they have practiced relaxation in the past, including any specific strategies or hobbies. Today's discussion focused on the topic of RELAXATION and patients reviewed and engaged in a variety of relaxation strategies techniques including deep breathing, guided imagery/meditation, and progressive muscle relaxation. After each  strategy was reviewed, group members were invited to engage in an active practice of relaxation strategies identified. Review and background on the fight-or-flight response was also provided.   A: Gail [name] was active in her participation of discussion and activity, sharing that she currently likes to relax by watching TV or reading a book. Pt appeared receptive to strategies reviewed, noting she had not tried or heard of them before and was most interested in guided imagery. Pt stated she was not sure what would be a relaxing environment for her, however was open to exploration.   P: Pt will discharge today, 08/24/2021 with plan to follow up with individual therapist and provider for ongoing therapy.  OCCUPATIONAL THERAPY DISCHARGE SUMMARY  Visits from Start of Care: 9  Current functional level related to goals / functional outcomes: Gail [name] has partially met 3/3 OT goals during her time in the [facility] program. Pt has had intermittent attendance and struggled to participate in group discussion, at times, however when prompted, has identified helping coping strategies and stress management techniques she can utilize to manage her depression/anxiety including watching TV, playing with her dog, and reading. Pt reports desire to discharge at this time due to satisfaction with current level of function and starting a new school semester on Monday. Pt discharged from [facility], effective 08/24/2021 with a plan to follow up with her individual therapist and provider for ongoing therapy and treatment needs.    Remaining deficits: See above    Education / Equipment: See above   Patient agrees to discharge. Patient goals were partially met. Patient is being discharged due to being pleased with the current functional level..       Plan - 08/24/21 1205     Occupational performance deficits (Please refer to evaluation for details): ADL's;IADL's;Rest and Sleep;Education;Work;Leisure;Social Participation    Body  Structure / Function / Physical Skills ADL    Cognitive Skills Attention;Emotional;Energy/Drive;Learn;Memory;Perception;Understand;Thought;Temperament/Personality;Safety Awareness;Problem Solve    Psychosocial Skills Coping Strategies;Environmental  Adaptations;Habits;Interpersonal Interaction;Routines and Behaviors             Patient will benefit from skilled therapeutic intervention in order to improve the following deficits and impairments:   Body Structure / Function / Physical Skills: ADL Cognitive Skills: Attention, Emotional, Energy/Drive, Learn, Memory, Perception, Understand, Thought, Temperament/Personality, Safety Awareness, Problem Solve Psychosocial Skills: Coping Strategies, Environmental  Adaptations, Habits, Interpersonal Interaction, Routines and Behaviors   Visit Diagnosis: Difficulty coping  Frontal lobe and executive function deficit  Severe episode of recurrent major depressive disorder, without psychotic features [facility])    Problem List Patient Active Problem List   Diagnosis Date Noted   MDD (major depressive disorder), severe ([place]) 07/24/2021   Panic 05/08/2021   MDD (major depressive disorder), recurrent, severe, with psychosis ([place]) 05/04/2021   Contraception management 09/24/2018   MDD (  major depressive disorder), single episode, severe with psychosis ([place]) 05/03/2018   Severe major depression with psychotic features, mood-congruent ([place]) 04/28/2018   Suicide ideation 04/28/2018   GAD (generalized anxiety disorder) 04/28/2018   08/24/2021  Ponciano Ort, MOT, OTR/L   [facility] HOSPITALIZATION PROGRAM [place], [place], [address] Phone: [phone]   Fax:  [phone]  Name: Gail [name] MRN: [id number] Date of Birth: May 08, 2001

## 2021-08-27 ENCOUNTER — Other Ambulatory Visit (HOSPITAL_COMMUNITY): Payer: 59

## 2021-08-27 ENCOUNTER — Ambulatory Visit (HOSPITAL_COMMUNITY): Payer: 59

## 2021-08-28 ENCOUNTER — Other Ambulatory Visit (HOSPITAL_COMMUNITY): Payer: 59

## 2021-08-28 ENCOUNTER — Ambulatory Visit (HOSPITAL_COMMUNITY): Payer: 59

## 2021-08-29 ENCOUNTER — Other Ambulatory Visit (HOSPITAL_COMMUNITY): Payer: 59

## 2021-08-29 ENCOUNTER — Ambulatory Visit (HOSPITAL_COMMUNITY): Payer: 59

## 2021-08-29 ENCOUNTER — Encounter (HOSPITAL_COMMUNITY): Payer: Self-pay | Admitting: Family

## 2021-08-29 NOTE — Progress Notes (Signed)
°  [facility] Partial hospitalization outpatient Program Discharge Summary  Gail [name] [id number]  Admission date: 08/07/2021 Discharge date: 08/24/2021  Reason for admission: per admission assessment note: Gail [name] [place]- Gail [name] is a 21 y.o. Hispanic female presents with depression and visual hallucinations.  She reports a history of anxiety, depression and posttraumatic stress disorder.  With a possible schizophrenia diagnosis.  She reports chronic visual hallucinations of seeing "shadows".  States she was recently inpatient where she was prescribed Zoloft and Seroquel.  States she does not feel as if the nighttime medication is helping.  Discussed titrating Seroquel 25 milligrams to 50 mg nightly she was receptive to plan.  She denies illicit drug use or substance abuse history.  Progress in Program Toward Treatment Goals: Ongoing, Franci attended and participated with daily group session with active and engaged participation.  Treatment team denied any safety concerns at discharge.  Patient to keep follow-up appointment with outpatient providers.  Patient recently completed intensive outpatient programming and has completed partial hospitalization programming.  Patient to follow-up with individual therapy and keep outpatient follow-up for medication management.  Support, encouragement and reassurance was provided.  Progress (rationale): Keep all follow-up appointments with outpatient providers  Take all medications as prescribed. Keep all follow-up appointments as scheduled.  Do not consume alcohol or use illegal drugs while on prescription medications. Report any adverse effects from your medications to your primary care provider promptly.  In the event of recurrent symptoms or worsening symptoms, call 911, a crisis hotline, or go to the nearest emergency department for evaluation.    [name], NP 08/29/2021

## 2021-08-30 ENCOUNTER — Other Ambulatory Visit (HOSPITAL_COMMUNITY): Payer: 59

## 2021-08-30 ENCOUNTER — Ambulatory Visit (HOSPITAL_COMMUNITY): Payer: 59

## 2021-08-31 ENCOUNTER — Ambulatory Visit (HOSPITAL_COMMUNITY): Payer: 59

## 2021-08-31 ENCOUNTER — Other Ambulatory Visit (HOSPITAL_COMMUNITY): Payer: 59

## 2021-09-03 ENCOUNTER — Other Ambulatory Visit (HOSPITAL_COMMUNITY): Payer: 59

## 2021-09-04 ENCOUNTER — Other Ambulatory Visit (HOSPITAL_COMMUNITY): Payer: 59

## 2021-09-12 ENCOUNTER — Ambulatory Visit: Payer: 59 | Admitting: Nurse Practitioner

## 2021-09-12 ENCOUNTER — Telehealth: Payer: Self-pay | Admitting: Nurse Practitioner

## 2021-09-12 NOTE — Telephone Encounter (Signed)
Pt is a no show for 09/12/21, she called in at 9 stating she had felt sick for days.

## 2021-09-13 ENCOUNTER — Encounter: Payer: Self-pay | Admitting: Nurse Practitioner

## 2021-09-13 NOTE — Telephone Encounter (Signed)
2nd same day cancellation due to illness - sending letter but no charge generated - mailing no show letter  Pt is rescheduled for 09/18/21

## 2021-09-17 ENCOUNTER — Ambulatory Visit: Payer: 59 | Admitting: Nurse Practitioner

## 2021-09-18 ENCOUNTER — Ambulatory Visit: Payer: 59 | Admitting: Nurse Practitioner

## 2021-09-20 NOTE — Psych (Signed)
Virtual Visit via Video Note  I connected with Gail [name] on 08/21/21 at  9:00 AM EST by a video enabled telemedicine application and verified that I am speaking with the correct person using two identifiers.  Location: Patient: patient home Provider: clinical home office   I discussed the limitations of evaluation and management by telemedicine and the availability of in person appointments. The patient expressed understanding and agreed to proceed.  I discussed the assessment and treatment plan with the patient. The patient was provided an opportunity to ask questions and all were answered. The patient agreed with the plan and demonstrated an understanding of the instructions.   The patient was advised to call back or seek an in-person evaluation if the symptoms worsen or if the condition fails to improve as anticipated.  Pt was provided 240 minutes of non-face-to-face time during this encounter.   Gail Guiles, LCSW    [employer] PHP THERAPIST PROGRESS NOTE  Gail [name] [id number]  Session Time: 9:00 - 10:00  Participation Level: Active  Behavioral Response: CasualAlertDepressed  Type of Therapy: Individual Therapy  Treatment Goals addressed: Coping  Interventions: CBT, DBT, Supportive, and Reframing  Summary: Clinician led check-in regarding current stressors and situation. Clinician utilized active listening and empathetic response and validated patient emotions. Clinician facilitated processing group on pertinent issues.   Therapist Response: Gail [name] is a 21 y.o. female who presents with depression symptoms.  Patient arrived within time allowed and reports that she is feeling "not bad." Patient rates her mood at a 7 on a scale of 1-10 with 10 being great. Pt reports her long weekend was "pretty good" and that she spent time with friends and family. Pt states she felt overwhelmed after all the social time and tried to  recover on Monday. Pt states struggle with loneliness and over committing herself. Pt able to process. Pt engaged in discussion.         Session Time: 10:00 - 11:00   Participation Level: Active   Behavioral Response: CasualAlertDepressed   Type of Therapy: Individual Therapy   Treatment Goals addressed: Coping   Interventions: CBT, DBT, Supportive and Reframing   Summary: Cln facilitated processing around difficult relationships. Pt shared struggles they face in relationships. Cln brought in topics of self-esteem, CBT thought challenging, boundaries, and communication to aid growth.    Therapist Response:  Pt reports difficulties in relationship with mom and not knowing how to set boundaries while still relying for support. Pt is able to process.          Session Time: 11:00- 12:00   Participation Level: Active   Behavioral Response: CasualAlertDepressed   Type of Therapy: Group Therapy, OT   Treatment Goals addressed: Coping   Interventions: Psychosocial skills training, Supportive   Summary: Occupational Therapy group   Therapist Response: Patient engaged in group. See OT note.          Session Time: 12:00 -1:00   Participation Level: Active   Behavioral Response: CasualAlertDepressed   Type of Therapy: Individual therapy   Treatment Goals addressed: Coping   Interventions: CBT; Solution focused; Supportive; Reframing   Summary: 12:00 - 12:50: Cln led discussion on decision making and how to apply logic to fears. Group viewed TED talk "Why you should define your fears not your goals" to aid discussion. Group discussed ways to consider how we can address fears and make them manageable.  12:50 -1:00 Clinician led check-out. Clinician assessed for immediate needs, medication compliance and  efficacy, and safety concerns   Therapist Response: 12:00 - 12:50: Pt engaged in discussion and practices decision making model.  12:50 - 1:00: At check-out, patient rates  her mood at a 7 on a scale of 1-10 with 10 being great. Pt reports afternoon plans of watching a movie. Pt demonstrates some progress as evidenced by increased social outings. Patient denies SI/HI at the end of group.    Suicidal/Homicidal: Nowithout intent/plan  Plan: Pt will continue in PHP while working to decrease depression symptoms, increase emotion regulation, and increase ability to manage symptoms in a healthy manner.   Diagnosis: Severe episode of recurrent major depressive disorder, without psychotic features (HCC) [F33.2]    1. Severe episode of recurrent major depressive disorder, without psychotic features (HCC)       Gail Guiles, LCSW 09/20/2021

## 2021-09-20 NOTE — Psych (Signed)
Virtual Visit via Video Note  I connected with Gail [name] on 08/07/21 at  9:00 AM EST by a video enabled telemedicine application and verified that I am speaking with the correct person using two identifiers.  Location: Patient: patient home Provider: clinical home office   I discussed the limitations of evaluation and management by telemedicine and the availability of in person appointments. The patient expressed understanding and agreed to proceed.  I discussed the assessment and treatment plan with the patient. The patient was provided an opportunity to ask questions and all were answered. The patient agreed with the plan and demonstrated an understanding of the instructions.   The patient was advised to call back or seek an in-person evaluation if the symptoms worsen or if the condition fails to improve as anticipated.  Pt was provided 240 minutes of non-face-to-face time during this encounter.   Gail Guiles, LCSW    [facility] PHP THERAPIST PROGRESS NOTE  Gail [name] [id number]  Session Time: 9:00 - 10:00  Participation Level: Active  Behavioral Response: CasualAlertDepressed  Type of Therapy: Group Therapy  Treatment Goals addressed: Coping  Interventions: CBT, DBT, Supportive, and Reframing  Summary: Clinician led check-in regarding current stressors and situation. Clinician utilized active listening and empathetic response and validated patient emotions. Clinician facilitated processing group on pertinent issues.   Therapist Response: Gail [name] is a 21 y.o. female who presents with depression symptoms.  Patient arrived within time allowed and reports that she is feeling "okay." Patient rates her mood at a 5 on a scale of 1-10 with 10 being great. Pt reports yesterday was "regular" and she spent time with her dog and played games on her phone. Pt states struggle with rumination and has been pacing "all the time." Pt  states she slept approximately 3 hours and has poor appetite. Pt identified self harm thoughts yesterday and managed them by staying in bed. Pt able to process. Pt engaged in discussion.         Session Time: 10:00 - 11:00   Participation Level: Active   Behavioral Response: CasualAlertDepressed   Type of Therapy: Group Therapy   Treatment Goals addressed: Coping   Interventions: CBT, DBT, Supportive and Reframing   Summary: Cln continued discussion on rest and introduced the nine types of rest: time away, permission to not be helpful, something unproductive, connection to art and nature, solitude to recharge, break from responsibility, stillness to decompress, safe space, alone time at home. Group shared ways in which they can utilize each type of rest and which ones are most problematic for them.     Therapist Response:  Pt engaged in discussion and shares resting is difficult due to racing thoughts.         Session Time: 11:00- 12:00   Participation Level: Active   Behavioral Response: CasualAlertDepressed   Type of Therapy: Group Therapy, OT   Treatment Goals addressed: Coping   Interventions: Supportive, Skills training   Summary: Occupational Therapy group   Therapist Response: Pt engaged in discussion. See note          Session Time: 12:00- 1:00   Participation Level: Active   Behavioral Response: CasualAlertDepressed   Type of Therapy: Group Therapy   Treatment Goals addressed: Coping   Interventions: CBT, DBT, Supportive and Reframing   Summary: 12:00-12:50: Cln led discussion on rejection and how the fear of rejection and/or past rejections effect [place]. Group viewed TED talk "what I learned from 100 days of rejection"  to add to discussion.  12:50 -1:00 Clinician led check-out. Clinician assessed for immediate needs, medication compliance and efficacy, and safety concerns   Therapist Response: 12:00-12:50  Pt engaged in discussion and is able to  reframe a rejection from their life. 12:50 - 1:00: At check-out, patient rates her mood at a 7 on a scale of 1-10 with 10 being great. Pt reports afternoon plans of napping. Pt demonstrates some progress as evidenced by participating in first group session. Patient denies SI/HI at the end of group.    Suicidal/Homicidal: Nowithout intent/plan  Plan: Pt will continue in PHP while working to decrease depression symptoms, increase emotion regulation, and increase ability to manage symptoms in a healthy manner.   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 MDD (major depressive disorder), severe (HCC)       Gail Guiles, LCSW 09/20/2021

## 2021-09-24 NOTE — Psych (Signed)
Virtual Visit via Video Note  I connected with Edita Weyenberg [name] on 08/23/21 at  9:00 AM EST by a video enabled telemedicine application and verified that I am speaking with the correct person using two identifiers.  Location: Patient: patient home Provider: clinical home office   I discussed the limitations of evaluation and management by telemedicine and the availability of in person appointments. The patient expressed understanding and agreed to proceed.  I discussed the assessment and treatment plan with the patient. The patient was provided an opportunity to ask questions and all were answered. The patient agreed with the plan and demonstrated an understanding of the instructions.   The patient was advised to call back or seek an in-person evaluation if the symptoms worsen or if the condition fails to improve as anticipated.  Pt was provided 240 minutes of non-face-to-face time during this encounter.   [name], LCSW    [facility] PHP THERAPIST PROGRESS NOTE  Annamarie Yamaguchi [name] [id number]  Session Time: 9:00 - 10:00  Participation Level: Active  Behavioral Response: CasualAlertDepressed  Type of Therapy: Group Therapy  Treatment Goals addressed: Coping  Interventions: CBT, DBT, Supportive, and Reframing  Summary: Clinician led check-in regarding current stressors and situation. Clinician utilized active listening and empathetic response and validated patient emotions. Clinician facilitated processing group on pertinent issues.   Therapist Response: Ruhi Kopke [name] is a 21 y.o. female who presents with depression symptoms.  Patient arrived within time allowed and reports that she is feeling "anxious." Patient rates her mood at a 4 on a scale of 1-10 with 10 being great. Pt states she continues to feel physically ill and that it is affecting her sleep and mood. Pt reports over-thinking re: schoolwork and not making progress the way she hoped.  Pt states engaging in self-harm behaviors of hitting her face on the bed. Pt denies SI. Pt able to process. Pt engaged in discussion.         Session Time: 10:00 - 11:00   Participation Level: Active   Behavioral Response: CasualAlertDepressed   Type of Therapy: Group Therapy   Treatment Goals addressed: Coping   Interventions: CBT, DBT, Supportive and Reframing   Summary: Cln led processing group for pt's current struggles. Group members shared stressors and provided support and feedback. Cln brought in topics of boundaries, healthy relationships, and unhealthy thought processes to inform discussion.     Therapist Response: Pt able to process and provide support to group.          Session Time: 11:00- 12:00   Participation Level: Active   Behavioral Response: CasualAlertDepressed   Type of Therapy: Group Therapy, OT   Treatment Goals addressed: Coping   Interventions: Psychosocial skills training, Supportive   Summary: Occupational Therapy group   Therapist Response: Patient engaged in group. See OT note.          Session Time: 12:00 -1:00   Participation Level: Active   Behavioral Response: CasualAlertDepressed   Type of Therapy: Group therapy   Treatment Goals addressed: Coping   Interventions: CBT; Solution focused; Supportive; Reframing   Summary: 12:00 - 12:50: Cln led discussion on non-suicidal self-harm thoughts. Cln provided context for self-harm thoughts as intrusive thoughts in CBT framework. Cln discussed how to apply minfulness techniques and coping skill substitutions to manage self-harm thoughts. Group discussed their experiences with self-harm thoughts and how they can handle them in the future.  12:50 -1:00 Clinician led check-out. Clinician assessed for immediate needs, medication compliance and efficacy,  and safety concerns   Therapist Response: 12:00 - 12:50: Pt engaged in discussion and reports ways she can apply what was discussed.   12:50 - 1:00: At check-out, patient rates her mood at a 4 on a scale of 1-10 with 10 being great. Pt reports afternoon plans of going to a hiring event. Pt demonstrates some progress as evidenced by increased willingness to address negative thinking. Patient denies SI/HI at the end of group.    Suicidal/Homicidal: Nowithout intent/plan  Plan: Pt will continue in PHP while working to decrease depression symptoms, increase emotion regulation, and increase ability to manage symptoms in a healthy manner.   Diagnosis: Severe episode of recurrent major depressive disorder, without psychotic features (HCC) [F33.2]    1. Severe episode of recurrent major depressive disorder, without psychotic features (HCC)   2. GAD (generalized anxiety disorder)       [name], LCSW 09/24/2021

## 2021-09-24 NOTE — Psych (Signed)
Pt did not attend PHP group stating that she was experiencing mouth pain.

## 2021-10-04 NOTE — Psych (Signed)
Virtual Visit via Video Note  I connected with Gail [name] on 08/24/21 at  9:00 AM EST by a video enabled telemedicine application and verified that I am speaking with the correct person using two identifiers.  Location: Patient: patient home Provider: clinical home office   I discussed the limitations of evaluation and management by telemedicine and the availability of in person appointments. The patient expressed understanding and agreed to proceed.  I discussed the assessment and treatment plan with the patient. The patient was provided an opportunity to ask questions and all were answered. The patient agreed with the plan and demonstrated an understanding of the instructions.   The patient was advised to call back or seek an in-person evaluation if the symptoms worsen or if the condition fails to improve as anticipated.  Pt was provided 240 minutes of non-face-to-face time during this encounter.   [name], LCSW    [facility] PHP THERAPIST PROGRESS NOTE  Gail [name] [id number]  Session Time: 9:00 - 10:00  Participation Level: Active  Behavioral Response: CasualAlertDepressed  Type of Therapy: Group Therapy  Treatment Goals addressed: Coping  Interventions: CBT, DBT, Supportive, and Reframing  Summary: Clinician led check-in regarding current stressors and situation. Clinician utilized active listening and empathetic response and validated patient emotions. Clinician facilitated processing group on pertinent issues.   Therapist Response: Gail [name] is a 21 y.o. female who presents with depression symptoms.  Patient arrived within time allowed and reports that she is feeling "tired." Patient rates her mood at a 5 on a scale of 1-10 with 10 being great. Pt states struggling with rumination regarding school yesterday. Pt states she went to the hiring event as planned and went to school to finalize matters for her new semester.  Pt states sleeping well. Pt denies SI. Pt able to process. Pt engaged in discussion.         Session Time: 10:00 - 11:00   Participation Level: Active   Behavioral Response: CasualAlertDepressed   Type of Therapy: Group Therapy   Treatment Goals addressed: Coping   Interventions: CBT, DBT, Supportive and Reframing   Summary: Cln led discussion on ways to reinforce new habits. Group shared ways in which they can set themselves up for good habits. Cln encouraged reminders in phone, post-it notes, educating support system, and practicing in low-stress environments.     Therapist Response:  Pt engaged in discussion and is able to determine ways to reinforce positive habits.         Session Time: 11:00- 12:00   Participation Level: Active   Behavioral Response: CasualAlertDepressed   Type of Therapy: Group Therapy, OT   Treatment Goals addressed: Coping   Interventions: Psychosocial skills training, Supportive   Summary: Occupational Therapy group   Therapist Response: Patient engaged in group. See OT note.          Session Time: 12:00 -1:00   Participation Level: Active   Behavioral Response: CasualAlertDepressed   Type of Therapy: Group therapy   Treatment Goals addressed: Coping   Interventions: CBT; Solution focused; Supportive; Reframing   Summary: 12:00 - 12:50: Cln introduced DBT emotion regulation skill, PLEASE. Cln discussed the importance of taking care of our whole body as a way to aid in stabilizing emotions. Group discussed ways they strugge to manage the elements of PLEASE and how to remove barriers.  12:50 -1:00 Clinician led check-out. Clinician assessed for immediate needs, medication compliance and efficacy, and safety concerns   Therapist Response: 12:00 -  12:50: Pt engaged in discussion and is able to identify ways to utilize PLEASE skill.  12:50 - 1:00: At check-out, patient rates her mood at a 6 on a scale of 1-10 with 10 being great. Pt  reports afternoon plans of spending time with family. Pt demonstrates some progress as evidenced by increased goal directed behaviors. Patient denies SI/HI at the end of group.    Suicidal/Homicidal: Nowithout intent/plan  Plan: Pt will discharge from PHP due to meeting treatment goals of decreased depression symptoms, increased emotion regulation, and increased ability to manage symptoms in a healthy manner. Pt has declined IOP due to schedule conflicts with school semester. Pt will engage in outpatient services at [facility] counseling center and support groups at the [facility]. Pt and provider are aligned with discharge plan. Pt denies SI/HI at time of discharge.   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 MDD (major depressive disorder), severe (HCC)   2. GAD (generalized anxiety disorder)       [name], LCSW 10/04/2021

## 2021-10-22 NOTE — Psych (Signed)
Virtual Visit via Video Note  I connected with Gail [name] on 08/17/21 at  9:00 AM EST by a video enabled telemedicine application and verified that I am speaking with the correct person using two identifiers.  Location: Patient: patient home Provider: clinical home office   I discussed the limitations of evaluation and management by telemedicine and the availability of in person appointments. The patient expressed understanding and agreed to proceed.  I discussed the assessment and treatment plan with the patient. The patient was provided an opportunity to ask questions and all were answered. The patient agreed with the plan and demonstrated an understanding of the instructions.   The patient was advised to call back or seek an in-person evaluation if the symptoms worsen or if the condition fails to improve as anticipated.  Pt was provided 240 minutes of non-face-to-face time during this encounter.   Gail Guiles, LCSW    [facility] PHP THERAPIST PROGRESS NOTE  Gail [name] [id number]  Session Time: 9:00 - 10:00  Participation Level: Active  Behavioral Response: CasualAlertDepressed  Type of Therapy: Group Therapy  Treatment Goals addressed: Coping  Interventions: CBT, DBT, Supportive, and Reframing  Summary: Clinician led check-in regarding current stressors and situation. Clinician utilized active listening and empathetic response and validated patient emotions. Clinician facilitated processing group on pertinent issues.   Therapist Response: Gail [name] is a 21 y.o. female who presents with depression symptoms.  Patient arrived within time allowed and reports that she is feeling "anxious." Patient rates her mood at a 5.5 on a scale of 1-10 with 10 being great. Pt reports racing thoughts and continued struggles with rumination and negative thoughts. Pt states she slept all day yesterday due to stress. Pt identifies self-harm  thoughts and being able to manage them successfully.Pt states continued issues with appetite. Pt able to process. Pt engaged in discussion.         Session Time: 10:00 - 11:00   Participation Level: Active   Behavioral Response: CasualAlertDepressed   Type of Therapy: Group Therapy   Treatment Goals addressed: Coping   Interventions: CBT, DBT, Supportive and Reframing   Summary: Cln led discussion on "spiraling" or when our thoughts compound on one another to descend our feelings into worse and worse situations. Group members discussed the ways in which spiraling is difficult for them and when they are especially vulnerable. Cln offered DBT distraction skills as a way to halt the spiral once you recognize it.     Therapist Response:  Pt engaged in discussion and reports spiraling is a major issue for her. Pt is able to increase awareness of spiraling throughout the discussion.         Session Time: 11:00- 12:00   Participation Level: Active   Behavioral Response: CasualAlertDepressed   Type of Therapy: Group Therapy, OT   Treatment Goals addressed: Coping   Interventions: Supportive, Skills training   Summary: Occupational Therapy group   Therapist Response: Pt engaged in discussion. See note          Session Time: 12:00- 1:00   Participation Level: Active   Behavioral Response: CasualAlertDepressed   Type of Therapy: Group Therapy   Treatment Goals addressed: Coping   Interventions: CBT, DBT, Supportive and Reframing   Summary: 12:00-12:50: Cln led discussion on support groups and the role they can provide in recovery and ongoing treatment. Pt discussed success and barriers they have had with past support groups or they have with considering support groups. Cln provided  resources for local support groups 12:50 -1:00 Clinician led check-out. Clinician assessed for immediate needs, medication compliance and efficacy, and safety concerns   Therapist Response:  12:00-12:50  Pt engaged in discussion and reports interest in engaging in support groups.  12:50 - 1:00: At check-out, patient rates her mood at a 6 on a scale of 1-10 with 10 being great. Pt reports afternoon plans of watching movies. Pt demonstrates some progress as evidenced by increased effort to create change. Patient denies SI/HI at the end of group.    Suicidal/Homicidal: Nowithout intent/plan  Plan: Pt will continue in PHP while working to decrease depression symptoms, increase emotion regulation, and increase ability to manage symptoms in a healthy manner.   Diagnosis: Severe episode of recurrent major depressive disorder, without psychotic features (HCC) [F33.2]    1. Severe episode of recurrent major depressive disorder, without psychotic features (HCC)       Gail Guiles, LCSW 10/22/2021

## 2021-10-30 ENCOUNTER — Other Ambulatory Visit: Payer: Self-pay

## 2021-10-30 ENCOUNTER — Ambulatory Visit (INDEPENDENT_AMBULATORY_CARE_PROVIDER_SITE_OTHER): Payer: 59

## 2021-10-30 DIAGNOSIS — Z3042 Encounter for surveillance of injectable contraceptive: Secondary | ICD-10-CM

## 2021-10-30 MED ORDER — MEDROXYPROGESTERONE ACETATE 150 MG/ML IM SUSP
150.0000 mg | Freq: Once | INTRAMUSCULAR | Status: AC
  Administered 2021-10-30: 150 mg via INTRAMUSCULAR

## 2021-10-30 NOTE — Progress Notes (Addendum)
Per the orders of Alysia Penna NP, Pt is here for Depo-Provera injection. Pt received injection in the left upper outer quadrant of the buttocks 15:15 pm pt tolerated injection well. Injection given by Wynema Birch. CMA ( Supervised by [place] L. CMA/CPT) ?

## 2021-12-03 ENCOUNTER — Telehealth (HOSPITAL_COMMUNITY): Payer: Self-pay | Admitting: Psychiatry

## 2022-01-07 ENCOUNTER — Encounter: Payer: 59 | Admitting: Nurse Practitioner

## 2022-01-08 ENCOUNTER — Encounter: Payer: 59 | Admitting: Nurse Practitioner

## 2022-01-15 ENCOUNTER — Ambulatory Visit (INDEPENDENT_AMBULATORY_CARE_PROVIDER_SITE_OTHER): Payer: 59

## 2022-01-15 DIAGNOSIS — Z3042 Encounter for surveillance of injectable contraceptive: Secondary | ICD-10-CM | POA: Diagnosis not present

## 2022-01-15 MED ORDER — MEDROXYPROGESTERONE ACETATE 150 MG/ML IM SUSP
150.0000 mg | Freq: Once | INTRAMUSCULAR | Status: AC
  Administered 2022-01-15: 150 mg via INTRAMUSCULAR

## 2022-01-15 NOTE — Progress Notes (Signed)
Injected in pt's left glute at 2:11pm, pt tolerated well.

## 2022-01-29 ENCOUNTER — Ambulatory Visit (INDEPENDENT_AMBULATORY_CARE_PROVIDER_SITE_OTHER): Payer: 59 | Admitting: Obstetrics & Gynecology

## 2022-01-29 ENCOUNTER — Encounter: Payer: Self-pay | Admitting: Obstetrics & Gynecology

## 2022-01-29 ENCOUNTER — Other Ambulatory Visit (HOSPITAL_COMMUNITY)
Admission: RE | Admit: 2022-01-29 | Discharge: 2022-01-29 | Disposition: A | Discharge status: Home / Self Care | Payer: 59 | Source: Ambulatory Visit | Attending: Obstetrics & Gynecology | Admitting: Obstetrics & Gynecology

## 2022-01-29 VITALS — BP 94/62 | HR 77 | Ht 60.75 in | Wt 112.0 lb

## 2022-01-29 DIAGNOSIS — Z113 Encounter for screening for infections with a predominantly sexual mode of transmission: Secondary | ICD-10-CM | POA: Insufficient documentation

## 2022-01-29 DIAGNOSIS — Z01419 Encounter for gynecological examination (general) (routine) without abnormal findings: Secondary | ICD-10-CM

## 2022-01-29 DIAGNOSIS — Z3042 Encounter for surveillance of injectable contraceptive: Secondary | ICD-10-CM

## 2022-01-29 NOTE — Progress Notes (Signed)
Gail [name] [birth year] [id number]   History:    21 y.o. G0 Single.  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   RP:  Established patient presenting for annual gyn exam    HPI:  Well on DepoProvera started 09/2018.  No BTB.  No pelvic pain.  Had IC with condoms in the past, not currently sexually active.  STI screen Negative 09/2018.  Pap reflex/Gono-Chlam today.  Full STI screen.  Breasts normal. BMI 21.34.  Physically active.  Major Depression stable on Zoloft and Seroquel.   Past medical history,surgical history, family history and social history were all reviewed and documented in the EPIC chart.  Gynecologic History No LMP recorded. Patient has had an injection.  Obstetric History OB History  Gravida Para Term Preterm AB Living  0 0 0 0 0 0  SAB IAB Ectopic Multiple Live Births  0 0 0 0 0     ROS: A ROS was performed and pertinent positives and negatives are included in the history.  GENERAL: No fevers or chills. HEENT: No change in vision, no earache, sore throat or sinus congestion. NECK: No pain or stiffness. CARDIOVASCULAR: No chest pain or pressure. No palpitations. PULMONARY: No shortness of breath, cough or wheeze. GASTROINTESTINAL: No abdominal pain, nausea, vomiting or diarrhea, melena or bright red blood per rectum. GENITOURINARY: No urinary frequency, urgency, hesitancy or dysuria. MUSCULOSKELETAL: No joint or muscle pain, no back pain, no recent trauma. DERMATOLOGIC: No rash, no itching, no lesions. ENDOCRINE: No polyuria, polydipsia, no heat or cold intolerance. No recent change in weight. HEMATOLOGICAL: No anemia or easy bruising or bleeding. NEUROLOGIC: No headache, seizures, numbness, tingling or weakness. PSYCHIATRIC: No depression, no loss of interest in normal activity or change in sleep pattern.     Exam:   BP 94/62   Pulse 77   Ht 5' 0.75" (1.543 m)   Wt 112 lb (50.8 kg)   SpO2 99%   BMI 21.34 kg/m   Body mass index is  21.34 kg/m.  General appearance : Well developed well nourished female. No acute distress HEENT: Eyes: no retinal hemorrhage or exudates,  Neck supple, trachea midline, no carotid bruits, no thyroidmegaly Lungs: Clear to auscultation, no rhonchi or wheezes, or rib retractions  Heart: Regular rate and rhythm, no murmurs or gallops Breast:Examined in sitting and supine position were symmetrical in appearance, no palpable masses or tenderness,  no skin retraction, no nipple inversion, no nipple discharge, no skin discoloration, no axillary or supraclavicular lymphadenopathy Abdomen: no palpable masses or tenderness, no rebound or guarding Extremities: no edema or skin discoloration or tenderness  Pelvic: Vulva: Normal             Vagina: No gross lesions or discharge  Cervix: No gross lesions or discharge.  Pap reflex/Gono-Chlam done.  Uterus  AV, normal size, shape and consistency, non-tender and mobile  Adnexa  Without masses or tenderness  Anus: Normal   Assessment/Plan:  21 y.o. female for annual exam   1. Encounter for routine gynecological examination with Papanicolaou smear of cervix Well on DepoProvera started 09/2018.  No BTB.  No pelvic pain.  Had IC with condoms in the past, not currently sexually active.  STI screen Negative 09/2018.  Pap reflex/Gono-Chlam today.  Full STI screen.  Breasts normal. BMI 21.34.  Physically active.  Major Depression stable on Zoloft and Seroquel. - Cytology - PAP( [place])  2. Surveillance for Depo-Provera contraception Well on DepoProvera contraception.  Prescribed  and administered by her Fam NP.  3. Screen for STD (sexually transmitted disease) Strongly recommend condom use as needed. - HIV antibody (with reflex) - RPR - Hepatitis B Surface AntiGEN - Hepatitis C Antibody - Cytology - PAP( [place]) - Liston Alba MD, 9:08 AM 01/29/2022

## 2022-01-30 LAB — CYTOLOGY - PAP
Chlamydia: NEGATIVE
Comment: NEGATIVE
Comment: NORMAL
Diagnosis: NEGATIVE
Neisseria Gonorrhea: NEGATIVE

## 2022-01-30 LAB — HIV ANTIBODY (ROUTINE TESTING W REFLEX): HIV 1&2 Ab, 4th Generation: NONREACTIVE

## 2022-01-30 LAB — HEPATITIS B SURFACE ANTIGEN: Hepatitis B Surface Ag: NONREACTIVE

## 2022-01-30 LAB — HEPATITIS C ANTIBODY
Hepatitis C Ab: NONREACTIVE
SIGNAL TO CUT-OFF: 0.08 (ref <1.00)

## 2022-01-30 LAB — RPR: RPR Ser Ql: NONREACTIVE

## 2022-03-06 ENCOUNTER — Emergency Department (HOSPITAL_BASED_OUTPATIENT_CLINIC_OR_DEPARTMENT_OTHER)
Admission: EM | Admit: 2022-03-06 | Discharge: 2022-03-06 | Disposition: A | Discharge status: Home / Self Care | Payer: 59 | Attending: Emergency Medicine | Admitting: Emergency Medicine

## 2022-03-06 ENCOUNTER — Other Ambulatory Visit: Payer: Self-pay

## 2022-03-06 ENCOUNTER — Emergency Department (HOSPITAL_BASED_OUTPATIENT_CLINIC_OR_DEPARTMENT_OTHER): Payer: 59

## 2022-03-06 ENCOUNTER — Encounter (HOSPITAL_BASED_OUTPATIENT_CLINIC_OR_DEPARTMENT_OTHER): Payer: Self-pay | Admitting: Emergency Medicine

## 2022-03-06 DIAGNOSIS — M549 Dorsalgia, unspecified: Secondary | ICD-10-CM | POA: Diagnosis present

## 2022-03-06 LAB — PREGNANCY, URINE: Preg Test, Ur: NEGATIVE

## 2022-03-06 MED ORDER — CYCLOBENZAPRINE HCL 5 MG PO TABS
5.0000 mg | ORAL_TABLET | Freq: Once | ORAL | Status: AC
Start: 2022-03-06 — End: 2022-03-06
  Administered 2022-03-06: 5 mg via ORAL
  Filled 2022-03-06: qty 1

## 2022-03-06 MED ORDER — CYCLOBENZAPRINE HCL 10 MG PO TABS: 10.0000 mg | ORAL_TABLET | Freq: Two times a day (BID) | ORAL | 0 refills | Status: DC | PRN

## 2022-03-06 MED ORDER — LIDOCAINE 5 % EX PTCH
1.0000 | MEDICATED_PATCH | CUTANEOUS | Status: DC
  Administered 2022-03-06: 1 via TRANSDERMAL
  Filled 2022-03-06: qty 1

## 2022-03-06 NOTE — Discharge Instructions (Addendum)
Your x-ray today was negative for any fractures.  This is likely muscular in nature.  Continue taking your meloxicam at home and I have sent you in a muscle relaxer which may help you feel better.  If you like the lidocaine patch, you can pick that up over-the-counter.  Is called Salonpas.  Please remember to get up and move around several times a day as long periods of stillness can increase muscle pain and discomfort.  You can also place a heating pad over the area to help loosen the muscles.  You may notice that it takes a week or 2 to feel back to normal.

## 2022-03-06 NOTE — ED Triage Notes (Signed)
Patient arrived via POV c/o left mid back pain x 4 days. Patient states pain starts below left shoulder blade and continues down to mid back. Patient seen by UC Monday, given Rx that is not helping. Patient states pain 9/10. Patient states taking meloxicam at 1130. Patient is AO x 4, VS WDL, normal gait.

## 2022-03-06 NOTE — ED Notes (Signed)
Patient transported to X-ray 

## 2022-03-06 NOTE — ED Provider Notes (Signed)
[facility] EMERGENCY DEPARTMENT Provider Note   CSN: [id number] Arrival date & time: 03/06/22  2157     History  Chief Complaint  Patient presents with   Back Pain    Gail [name] is a 21 y.o. female who presents to the ED for evaluation of left mid back pain x4 days.  Patient states that she went to urgent care 2 days ago and was given meloxicam, but her pain is not improving.  Pain is worsened with palpation, movement and if she stands too long.  Patient denies known trauma or injury, however she does note that she is a very heavy sleeper and sleeps in very awkward positions.  She denies chest pain, shortness of breath, cough, congestion, abdominal pain, nausea, vomiting or diarrhea.   Back Pain Associated symptoms: no abdominal pain and no chest pain        Home Medications Prior to Admission medications   Medication Sig Start Date End Date Taking? Authorizing Provider  QUEtiapine (SEROQUEL) 50 MG tablet Take 1 tablet (50 mg total) by mouth at bedtime. 08/07/21   [name], NP  sertraline (ZOLOFT) 50 MG tablet Take 1 tablet (50 mg total) by mouth daily. 07/27/21   Estella Husk, MD  pantoprazole (PROTONIX) 40 MG tablet Take 1 tablet (40 mg total) by mouth 2 (two) times daily. Patient not taking: Reported on 06/16/2020 07/21/19 06/16/20  Tressia Danas, MD  promethazine (PHENERGAN) 25 MG tablet Take 1 tablet (25 mg total) by mouth every 6 (six) hours as needed for nausea or vomiting. Patient not taking: Reported on 06/16/2020 05/27/19 06/16/20  Jacalyn Lefevre, MD      Allergies    Patient has no known allergies.    Review of Systems   Review of Systems  Respiratory:  Negative for shortness of breath.   Cardiovascular:  Negative for chest pain.  Gastrointestinal:  Negative for abdominal pain, diarrhea, nausea and vomiting.  Musculoskeletal:  Positive for back pain.    Physical Exam Updated Vital Signs BP (!) 128/100 (BP  Location: Left Arm)   Pulse 87   Temp 98.3 F (36.8 C) (Oral)   Resp 17   Ht 5\' 1"  (1.549 m)   Wt 49.9 kg   SpO2 100%   BMI 20.78 kg/m  Physical Exam Vitals and nursing note reviewed.  Constitutional:      General: She is not in acute distress.    Appearance: She is not ill-appearing.  HENT:     Head: Atraumatic.  Eyes:     Conjunctiva/sclera: Conjunctivae normal.  Cardiovascular:     Rate and Rhythm: Normal rate and regular rhythm.     Pulses: Normal pulses.     Heart sounds: No murmur heard. Pulmonary:     Effort: Pulmonary effort is normal. No respiratory distress.     Breath sounds: Normal breath sounds.  Abdominal:     General: Abdomen is flat. There is no distension.     Palpations: Abdomen is soft.     Tenderness: There is no abdominal tenderness.  Musculoskeletal:        General: Normal range of motion.       Arms:     Cervical back: Normal range of motion.     Comments: Tender to palpation at the left thoracic musculature just below the left shoulder blade.  No midline thoracic tenderness.  Skin:    General: Skin is warm and dry.     Capillary Refill: Capillary refill takes  less than 2 seconds.  Neurological:     General: No focal deficit present.     Mental Status: She is alert.  Psychiatric:        Mood and Affect: Mood normal.     ED Results / Procedures / Treatments   Labs (all labs ordered are listed, but only abnormal results are displayed) Labs Reviewed  PREGNANCY, URINE    EKG None  Radiology No results found.  Procedures Procedures    Medications Ordered in ED Medications  lidocaine (LIDODERM) 5 % 1 patch (has no administration in time range)    ED Course/ Medical Decision Making/ A&P                           Medical Decision Making Amount and/or Complexity of Data Reviewed Radiology: ordered.  Risk Prescription drug management.   Social determinants of health:  Social History   Socioeconomic History   Marital  status: Single    Spouse name: Not on file   Number of children: 0   Years of education: Not on file   Highest education level: Some college, no degree  Occupational History   Not on file  Tobacco Use   Smoking status: Never   Smokeless tobacco: Never  Vaping Use   Vaping Use: Never used  Substance and Sexual Activity   Alcohol use: Yes    Comment: Rare   Drug use: Yes    Frequency: 1.0 times per week    Types: Marijuana   Sexual activity: Not Currently    Birth control/protection: Injection    Comment: 1st intercourse- 16, partners- less than 5  Other Topics Concern   Not on file  Social History Narrative   Not on file   Social Determinants of Health   Financial Resource Strain: Not on file  Food Insecurity: Not on file  Transportation Needs: Not on file  Physical Activity: Not on file  Stress: Not on file  Social Connections: Not on file  Intimate Partner Violence: Not on file     Initial impression:  This patient presents to the ED for concern of back pain, this involves an extensive number of treatment options, and is a complaint that carries with it a high risk of complications and morbidity.   Differentials include rib fracture, muscular strain   Comorbidities affecting care:  none  Additional history obtained: mother  Imaging Studies ordered:  I ordered imaging studies including  Tspine xray normal  I independently visualized and interpreted imaging and I agree with the radiologist interpretation.    Medicines ordered and prescription drug management:  I ordered medication including: Lidoderm patch   Reevaluation of the patient after these medicines showed that the patient improved I have reviewed the patients home medicines and have made adjustments as needed   ED Course/Re-evaluation: Presents in no acute distress and is nontoxic-appearing.  Vitals without significant abnormality.  She has tenderness to palpation of the thoracic musculature the  left side near the shoulder blade.  Full range of motion of the left shoulder.  Patient given Lidoderm patch here in the emergency department.  She declines Toradol as she had this at urgent care 2 days ago and it did not help her pain. Considered PE but patient is PERC negative.  Patient given lidocaine patch with some improvement.  X-ray was normal.  Patient's symptoms are likely muscular due to her awkward sleeping positions.  Recommend Salonpas over-the-counter, for patient  to continue her meloxicam and I have also sent her home with Flexeril.  Assured patient that symptoms will improve, but may take a week or 2 before resolution.  Patient expresses understanding and is amenable to plan.  Disposition:  After consideration of the diagnostic results, physical exam, history and the patients response to treatment feel that the patent would benefit from discharge.   Mid back pain on left side: Plan and management as described above. Discharged home in good condition.  Final Clinical Impression(s) / ED Diagnoses Final diagnoses:  Mid back pain on left side    Rx / DC Orders ED Discharge Orders     None         Janell Quiet, PA-C 03/06/22 2334    Virgina Norfolk, DO 03/07/22 1600

## 2022-03-07 ENCOUNTER — Ambulatory Visit: Payer: 59 | Admitting: Nurse Practitioner

## 2022-03-08 ENCOUNTER — Ambulatory Visit: Payer: 59 | Admitting: Nurse Practitioner

## 2022-03-21 ENCOUNTER — Ambulatory Visit (INDEPENDENT_AMBULATORY_CARE_PROVIDER_SITE_OTHER): Payer: 59 | Admitting: Nurse Practitioner

## 2022-03-21 ENCOUNTER — Encounter: Payer: Self-pay | Admitting: Nurse Practitioner

## 2022-03-21 VITALS — BP 110/60 | HR 86 | Temp 97.4°F | Ht 61.0 in | Wt 111.6 lb

## 2022-03-21 DIAGNOSIS — F32A Depression, unspecified: Secondary | ICD-10-CM | POA: Insufficient documentation

## 2022-03-21 DIAGNOSIS — Z0000 Encounter for general adult medical examination without abnormal findings: Secondary | ICD-10-CM | POA: Diagnosis not present

## 2022-03-21 DIAGNOSIS — F3341 Major depressive disorder, recurrent, in partial remission: Secondary | ICD-10-CM

## 2022-03-21 DIAGNOSIS — Z23 Encounter for immunization: Secondary | ICD-10-CM

## 2022-03-21 LAB — COMPREHENSIVE METABOLIC PANEL
ALT: 9 U/L (ref 0–35)
AST: 14 U/L (ref 0–37)
Albumin: 4.5 g/dL (ref 3.5–5.2)
Alkaline Phosphatase: 48 U/L (ref 39–117)
BUN: 9 mg/dL (ref 6–23)
CO2: 27 mEq/L (ref 19–32)
Calcium: 9.6 mg/dL (ref 8.4–10.5)
Chloride: 104 mEq/L (ref 96–112)
Creatinine, Ser: 0.91 mg/dL (ref 0.40–1.20)
GFR: 90.05 mL/min (ref >60.00)
Glucose, Bld: 89 mg/dL (ref 70–99)
Potassium: 4.5 mEq/L (ref 3.5–5.1)
Sodium: 137 mEq/L (ref 135–145)
Total Bilirubin: 1.6 mg/dL — ABNORMAL HIGH (ref 0.2–1.2)
Total Protein: 7.3 g/dL (ref 6.0–8.3)

## 2022-03-21 LAB — CBC
HCT: 41.9 % (ref 36.0–46.0)
Hemoglobin: 13.7 g/dL (ref 12.0–15.0)
MCHC: 32.7 g/dL (ref 30.0–36.0)
MCV: 93.4 fl (ref 78.0–100.0)
Platelets: 323 10*3/uL (ref 150.0–400.0)
RBC: 4.49 Mil/uL (ref 3.87–5.11)
RDW: 12.6 % (ref 11.5–15.5)
WBC: 4.4 10*3/uL (ref 4.0–10.5)

## 2022-03-21 NOTE — Patient Instructions (Addendum)
Go to lab  Gail [name] with school Schedule appt for eye exam  Preventive Care 22-22 Years Old, Female Preventive care refers to lifestyle choices and visits with your health care provider that can promote health and wellness. At this stage in your life, you may start seeing a primary care physician instead of a pediatrician for your preventive care. Preventive care visits are also called wellness exams. What can I expect for my preventive care visit? Counseling During your preventive care visit, your health care provider may ask about your: Medical history, including: Past medical problems. Family medical history. Pregnancy history. Current health, including: Menstrual cycle. Method of birth control. Emotional well-being. Home life and relationship well-being. Sexual activity and sexual health. Lifestyle, including: Alcohol, nicotine or tobacco, and drug use. Access to firearms. Diet, exercise, and sleep habits. Sunscreen use. Motor vehicle safety. Physical exam Your health care provider may check your: Height and weight. These may be used to calculate your BMI (body mass index). BMI is a measurement that tells if you are at a healthy weight. Waist circumference. This measures the distance around your waistline. This measurement also tells if you are at a healthy weight and may help predict your risk of certain diseases, such as type 2 diabetes and high blood pressure. Heart rate and blood pressure. Body temperature. Skin for abnormal spots. Breasts. What immunizations do I need?  Vaccines are usually given at various ages, according to a schedule. Your health care provider will recommend vaccines for you based on your age, medical history, and lifestyle or other factors, such as travel or where you work. What tests do I need? Screening Your health care provider may recommend screening tests for certain conditions. This may include: Vision and hearing tests. Lipid and  cholesterol levels. Pelvic exam and Pap test. Hepatitis B test. Hepatitis C test. HIV (human immunodeficiency virus) test. STI (sexually transmitted infection) testing, if you are at risk. Tuberculosis skin test if you have symptoms. BRCA-related cancer screening. This may be done if you have a family history of breast, ovarian, tubal, or peritoneal cancers. Talk with your health care provider about your test results, treatment options, and if necessary, the need for more tests. Follow these instructions at home: Eating and drinking  Eat a healthy diet that includes fresh fruits and vegetables, whole grains, lean protein, and low-fat dairy products. Drink enough fluid to keep your urine pale yellow. Do not drink alcohol if: Your health care provider tells you not to drink. You are pregnant, may be pregnant, or are planning to become pregnant. You are under the legal drinking age. In the [place] the legal drinking age is 14. If you drink alcohol: Limit how much you have to 0-1 drink a day. Know how much alcohol is in your drink. In the [place] one drink equals one 12 oz bottle of beer (355 mL), one 5 oz glass of wine (148 mL), or one 1 oz glass of hard liquor (44 mL). Lifestyle Brush your teeth every morning and night with fluoride toothpaste. Floss one time each day. Exercise for at least 30 minutes 5 or more days of the week. Do not use any products that contain nicotine or tobacco. These products include cigarettes, chewing tobacco, and vaping devices, such as e-cigarettes. If you need help quitting, ask your health care provider. Do not use drugs. If you are sexually active, practice safe sex. Use a condom or other form of protection to prevent STIs. If you do not wish to  become pregnant, use a form of birth control. If you plan to become pregnant, see your health care provider for a prepregnancy visit. Find healthy ways to manage stress, such as: Meditation, yoga, or listening to  music. Journaling. Talking to a trusted person. Spending time with friends and family. Safety Always wear your seat belt while driving or riding in a vehicle. Do not drive: If you have been drinking alcohol. Do not ride with someone who has been drinking. When you are tired or distracted. While texting. If you have been using any mind-altering substances or drugs. Wear a helmet and other protective equipment during sports activities. If you have firearms in your house, make sure you follow all gun safety procedures. Seek help if you have been bullied, physically abused, or sexually abused. Use the internet responsibly to avoid dangers, such as online bullying and online sex predators. What's next? Go to your health care provider once a year for an annual wellness visit. Ask your health care provider how often you should have your eyes and teeth checked. Stay up to date on all vaccines. This information is not intended to replace advice given to you by your health care provider. Make sure you discuss any questions you have with your health care provider. Document Revised: 01/31/2021 Document Reviewed: 01/31/2021 Elsevier Patient Education  [employer].

## 2022-03-21 NOTE — Assessment & Plan Note (Signed)
Stable mood with Seroquel and zoloft. Denies any SI or hallucinations Under the care of pasychiatry and counselor.

## 2022-03-21 NOTE — Progress Notes (Signed)
Complete physical exam  Patient: Gail [name]   DOB: 09/24/2000   20 y.o. Female  MRN: [id number] Visit Date: 03/21/2022  Subjective:    Chief Complaint  Patient presents with   Annual Exam    CPE Pt fasting  HPV & TDAP given today No concerns    Gail [name] is a 21 y.o. female who presents today for a complete physical exam. She reports consuming a general diet.  none  She generally feels fairly well. She reports sleeping fairly well. She does not have additional problems to discuss today.  Vision:No Dental:Yes STD Screen:No  Wt Readings from Last 3 Encounters:  03/21/22 111 lb 9.6 oz (50.6 kg)  03/06/22 110 lb (49.9 kg)  01/29/22 112 lb (50.8 kg)    BP Readings from Last 3 Encounters:  03/21/22 110/60  03/06/22 101/75  01/29/22 94/62    Most recent fall risk assessment:    03/21/2022   10:00 AM  Fall Risk   Falls in the past year? 0  Number falls in past yr: 0  Injury with Fall? 0   Most recent depression screenings:    03/21/2022   10:36 AM 08/21/2021   10:40 AM  PHQ 2/9 Scores  PHQ - 2 Score 2   PHQ- 9 Score 2      Information is confidential and restricted. Go to Review Flowsheets to unlock data.      03/21/2022   10:36 AM 08/21/2021   10:40 AM 08/06/2021   10:32 AM  Depression screen PHQ 2/9  Decreased Interest 1    Down, Depressed, Hopeless 1    PHQ - 2 Score 2    Altered sleeping 0    Tired, decreased energy 0    Change in appetite 0    Feeling bad or failure about yourself  0    Trouble concentrating 0    Moving slowly or fidgety/restless 0    Suicidal thoughts 0    PHQ-9 Score 2    Difficult doing work/chores Not difficult at all       Information is confidential and restricted. Go to Review Flowsheets to unlock data.       03/21/2022   10:36 AM 01/02/2021    1:08 PM 06/07/2019    9:23 AM 09/23/2018    4:00 PM  GAD 7 : Generalized Anxiety Score  Nervous, Anxious, on Edge 0 2 2 1   Control/stop worrying 1 2 2 1    Worry too much - different things 1 2 1 1   Trouble relaxing 0 1 3 1   Restless 0 0 2 0  Easily annoyed or irritable 0 0 3 1  Afraid - awful might happen 0 2 1 1   Total GAD 7 Score 2 9 14 6   Anxiety Difficulty Not difficult at all Somewhat difficult        HPI  Depression Stable mood with Seroquel and zoloft. Denies any SI or hallucinations Under the care of pasychiatry and counselor.   Past Medical History:  Diagnosis Date   Anxiety    Depression    Medical history non-contributory    Suicide ideation 04/28/2018   Vision abnormalities    Past Surgical History:  Procedure Laterality Date   WISDOM TOOTH EXTRACTION     Social History   Socioeconomic History   Marital status: Single    Spouse name: Not on file   Number of children: 0   Years of education: Not on file   Highest education level:  Some college, no degree  Occupational History   Not on file  Tobacco Use   Smoking status: Never   Smokeless tobacco: Never  Vaping Use   Vaping Use: Some days   Start date: 08/21/2020   Substances: CBD  Substance and Sexual Activity   Alcohol use: Yes    Comment: Rare   Drug use: Not Currently    Frequency: 1.0 times per week   Sexual activity: Yes    Birth control/protection: Injection    Comment: 1st intercourse- 16, partners- less than 5  Other Topics Concern   Not on file  Social History Narrative   Not on file   Social Determinants of Health   Financial Resource Strain: Not on file  Food Insecurity: Not on file  Transportation Needs: Not on file  Physical Activity: Not on file  Stress: Not on file  Social Connections: Not on file  Intimate Partner Violence: Not on file   Family Status  Relation Name Status   Mother  Alive   Father  (Not Specified)   Sister  Alive   MGM  (Not Specified)   Cousin  Deceased   Family History  Problem Relation Age of Onset   Rheum arthritis Mother    Lupus Mother    Obesity Mother    Hypertension Mother    Kidney  disease Mother    Diabetes Father    Bipolar disorder Sister    Healthy Sister    Breast cancer Maternal Grandmother    Cancer Maternal Grandmother        breast cancer   Diabetes Maternal Grandmother    Hypertension Maternal Grandmother    Hyperlipidemia Maternal Grandmother    Breast cancer Cousin    Post-traumatic stress disorder Cousin    No Known Allergies  Patient Care Team: Demichael Traum, Bonna Gains, NP as PCP - General (Internal Medicine) Genia Del, MD as Consulting Physician (Obstetrics and Gynecology)   Medications: Outpatient Medications Prior to Visit  Medication Sig   cyclobenzaprine (FLEXERIL) 10 MG tablet Take 1 tablet (10 mg total) by mouth 2 (two) times daily as needed for muscle spasms.   QUEtiapine (SEROQUEL) 50 MG tablet Take 1 tablet (50 mg total) by mouth at bedtime.   sertraline (ZOLOFT) 50 MG tablet Take 1 tablet (50 mg total) by mouth daily.   No facility-administered medications prior to visit.    Review of Systems  Constitutional:  Negative for fever.  HENT:  Negative for congestion and sore throat.   Eyes:        Negative for visual changes  Respiratory:  Negative for cough and shortness of breath.   Cardiovascular:  Negative for chest pain, palpitations and leg swelling.  Gastrointestinal:  Negative for blood in stool, constipation and diarrhea.  Genitourinary:  Negative for dysuria, frequency and urgency.  Musculoskeletal:  Negative for myalgias.  Skin:  Negative for rash.  Neurological:  Negative for dizziness and headaches.  Hematological:  Does not bruise/bleed easily.  Psychiatric/Behavioral:  Negative for suicidal ideas. The patient is not nervous/anxious.         Objective:  BP 110/60 (BP Location: Right Arm, Patient Position: Sitting, Cuff Size: Small)   Pulse 86   Temp (!) 97.4 F (36.3 C) (Temporal)   Ht 5\' 1"  (1.549 m)   Wt 111 lb 9.6 oz (50.6 kg)   SpO2 96%   BMI 21.09 kg/m       Physical Exam Vitals reviewed.   Constitutional:  General: She is not in acute distress. HENT:     Right Ear: Tympanic membrane, ear canal and external ear normal.     Left Ear: Tympanic membrane, ear canal and external ear normal.     Nose: Nose normal.     Mouth/Throat:     Pharynx: No oropharyngeal exudate.  Eyes:     General: No scleral icterus.    Extraocular Movements: Extraocular movements intact.     Conjunctiva/sclera: Conjunctivae normal.  Cardiovascular:     Rate and Rhythm: Normal rate and regular rhythm.     Pulses: Normal pulses.     Heart sounds: Normal heart sounds.  Pulmonary:     Effort: Pulmonary effort is normal. No respiratory distress.     Breath sounds: Normal breath sounds.  Abdominal:     General: Bowel sounds are normal. There is no distension.     Palpations: Abdomen is soft.  Musculoskeletal:        General: Normal range of motion.     Cervical back: Normal range of motion and neck supple.     Right lower leg: No edema.     Left lower leg: No edema.  Lymphadenopathy:     Cervical: No cervical adenopathy.  Skin:    General: Skin is warm and dry.  Neurological:     Mental Status: She is alert and oriented to person, place, and time.  Psychiatric:        Mood and Affect: Mood normal.        Behavior: Behavior normal.        Thought Content: Thought content normal.      Results for orders placed or performed in visit on 03/21/22  CBC  Result Value Ref Range   WBC 4.4 4.0 - 10.5 K/uL   RBC 4.49 3.87 - 5.11 Mil/uL   Platelets 323.0 150.0 - 400.0 K/uL   Hemoglobin 13.7 12.0 - 15.0 g/dL   HCT 03.0 09.2 - 33.0 %   MCV 93.4 78.0 - 100.0 fl   MCHC 32.7 30.0 - 36.0 g/dL   RDW 07.6 22.6 - 33.3 %  Comprehensive metabolic panel  Result Value Ref Range   Sodium 137 135 - 145 mEq/L   Potassium 4.5 3.5 - 5.1 mEq/L   Chloride 104 96 - 112 mEq/L   CO2 27 19 - 32 mEq/L   Glucose, Bld 89 70 - 99 mg/dL   BUN 9 6 - 23 mg/dL   Creatinine, Ser 5.45 0.40 - 1.20 mg/dL   Total  Bilirubin 1.6 (H) 0.2 - 1.2 mg/dL   Alkaline Phosphatase 48 39 - 117 U/L   AST 14 0 - 37 U/L   ALT 9 0 - 35 U/L   Total Protein 7.3 6.0 - 8.3 g/dL   Albumin 4.5 3.5 - 5.2 g/dL   GFR 62.56 >38.93 mL/min   Calcium 9.6 8.4 - 10.5 mg/dL      Assessment & Plan:    Routine Health Maintenance and Physical Exam  Immunization History  Administered Date(s) Administered   HPV 9-valent 03/21/2022   Hpv-Unspecified 09/13/2010, 09/16/2010   Influenza,inj,Quad PF,6+ Mos 04/29/2018, 06/07/2019, 08/08/2021   PFIZER(Purple Top)SARS-COV-2 Vaccination 03/14/2020, 04/02/2020   Tdap 07/15/2011, 03/21/2022    Health Maintenance  Topic Date Due   INFLUENZA VACCINE  03/19/2022   COVID-19 Vaccine (3 - Pfizer series) 04/06/2022 (Originally 05/28/2020)   PAP-Cervical Cytology Screening  01/29/2025   PAP SMEAR-Modifier  01/29/2025   TETANUS/TDAP  03/21/2032   HPV VACCINES  Completed   Hepatitis C Screening  Completed   HIV Screening  Completed   Discussed health benefits of physical activity, and encouraged her to engage in regular exercise appropriate for her age and condition.  Problem List Items Addressed This Visit       Other   Depression    Stable mood with Seroquel and zoloft. Denies any SI or hallucinations Under the care of pasychiatry and counselor.       Other Visit Diagnoses     Preventative health care    -  Primary   Relevant Orders   CBC (Completed)   Comprehensive metabolic panel (Completed)   Need for Tdap vaccination       Relevant Orders   Tdap vaccine greater than or equal to 7yo IM (Completed)   Need for HPV vaccination       Relevant Orders   HPV 9-valent vaccine,Recombinat (Completed)      Return in about 1 year (around 03/22/2023) for CPE (fasting).     Alysia Penna, NP

## 2022-04-17 ENCOUNTER — Ambulatory Visit: Payer: 59

## 2022-08-02 ENCOUNTER — Other Ambulatory Visit: Payer: Self-pay | Admitting: Family

## 2022-08-02 DIAGNOSIS — F322 Major depressive disorder, single episode, severe without psychotic features: Secondary | ICD-10-CM

## 2022-08-17 IMAGING — DX DG CHEST 1V PORT
1 series · 1 of 1 positions shown · non-contrast
Comparison: April 02, 2020

CLINICAL DATA: Productive cough with runny nose, sore throat and
generalized body aches.

EXAM:
PORTABLE CHEST 1 VIEW

[chest ap]
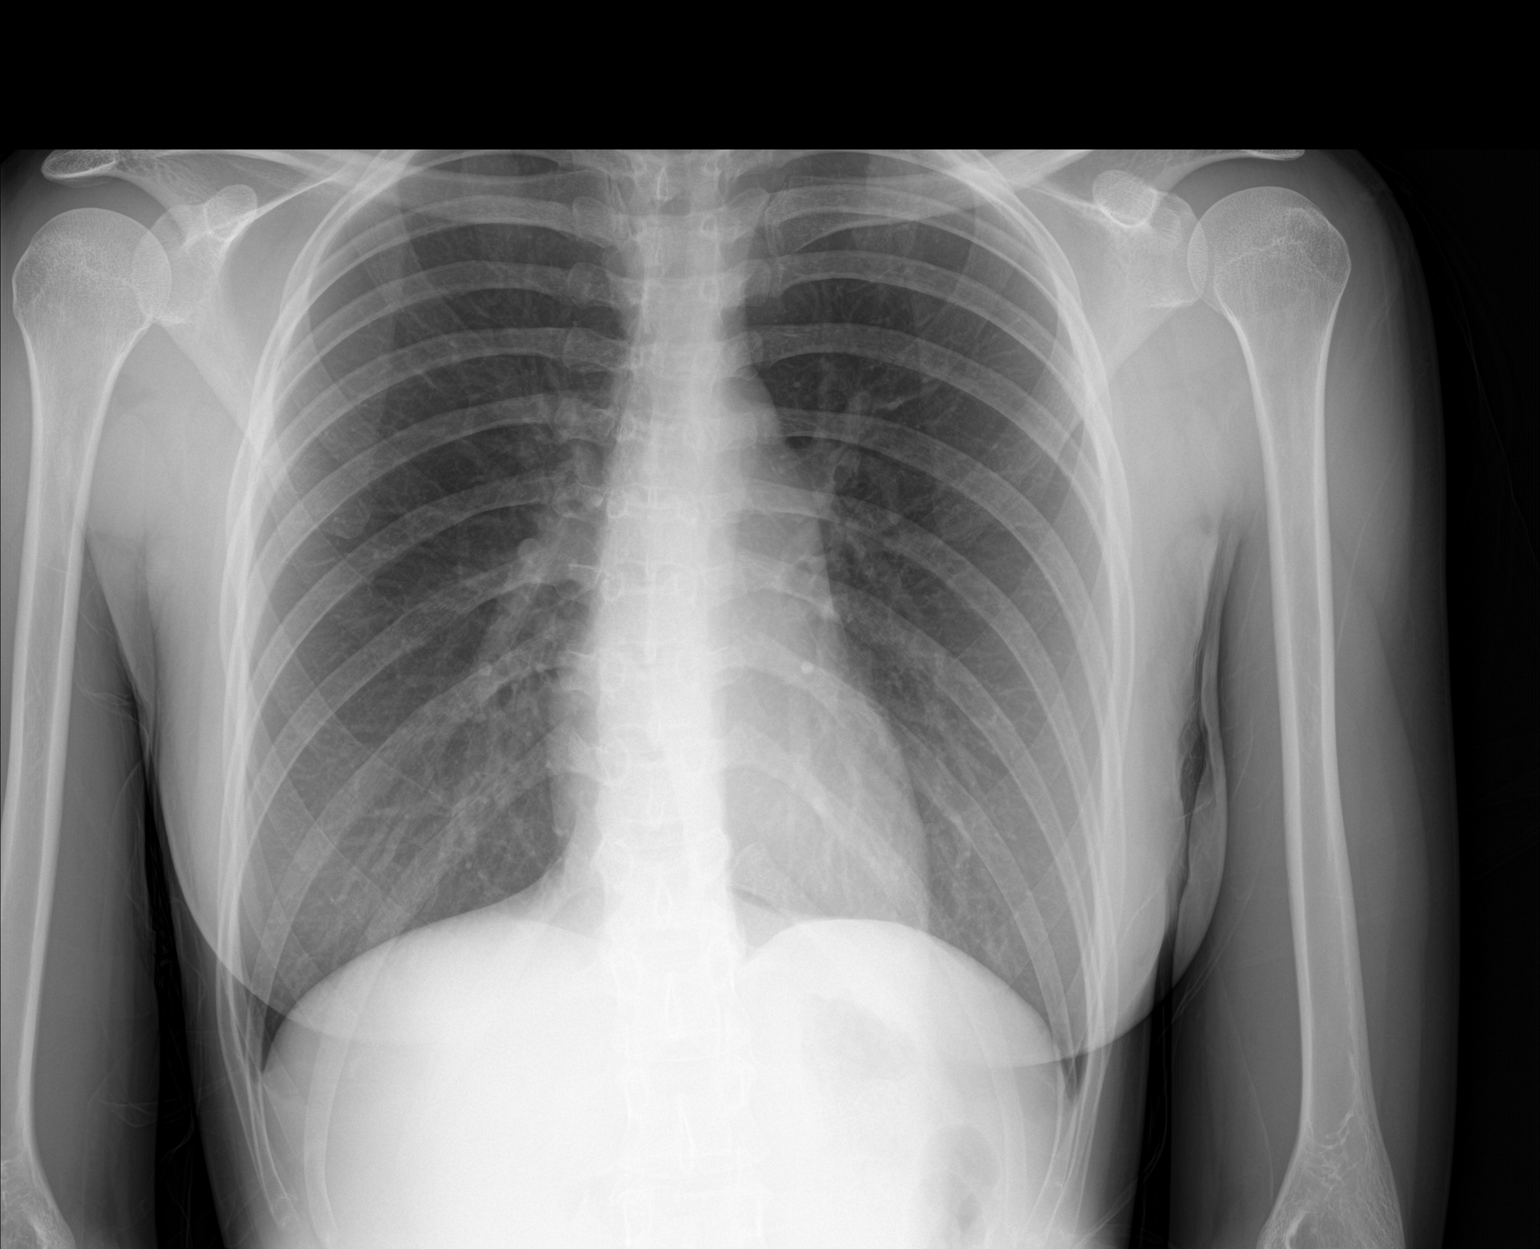

[1 of 1 positions shown; findings below may reference images not displayed]

FINDINGS: The heart size and mediastinal contours are within normal limits.
Both lungs are clear. The visualized skeletal structures are
unremarkable.
IMPRESSION: No active disease.

## 2023-01-28 ENCOUNTER — Encounter: Payer: Self-pay | Admitting: Podiatry

## 2023-01-28 ENCOUNTER — Ambulatory Visit (INDEPENDENT_AMBULATORY_CARE_PROVIDER_SITE_OTHER): Payer: 59 | Admitting: Podiatry

## 2023-01-28 DIAGNOSIS — L603 Nail dystrophy: Secondary | ICD-10-CM | POA: Diagnosis not present

## 2023-01-28 DIAGNOSIS — B353 Tinea pedis: Secondary | ICD-10-CM | POA: Diagnosis not present

## 2023-01-28 MED ORDER — TERBINAFINE HCL 250 MG PO TABS: 250.0000 mg | ORAL_TABLET | Freq: Every day | ORAL | 0 refills | Status: DC

## 2023-01-28 NOTE — Progress Notes (Signed)
  Subjective:  Patient ID: Gail [name], female    DOB: [birth year],  MRN: [id number] HPI Chief Complaint  Patient presents with   Skin Problem    Between toes bilateral - skin gets blistered, macerated x years, tried OTC creams, helps for awhile but comes back, also noticing toenails discoloring   New Patient (Initial Visit)    22 y.o. female presents with the above complaint.   ROS: Denies fever chills nausea vomit muscle aches pains calf pain back pain chest pain shortness of breath.  Past Medical History:  Diagnosis Date   Anxiety    Depression    Medical history non-contributory    Suicide ideation 04/28/2018   Vision abnormalities    Past Surgical History:  Procedure Laterality Date   WISDOM TOOTH EXTRACTION      Current Outpatient Medications:    terbinafine (LAMISIL) 250 MG tablet, Take 1 tablet (250 mg total) by mouth daily., Disp: 30 tablet, Rfl: 0  No Known Allergies Review of Systems Objective:  There were no vitals filed for this visit.  General: Well developed, nourished, in no acute distress, alert and oriented x3   Dermatological: Skin is warm, dry and supple bilateral. Nails x 10 are well maintained; remaining integument appears unremarkable at this time. There are no open sores, no preulcerative lesions, no rash or signs of infection present.  Interdigital tinea pedis and what appears to be nail dystrophy or onychomycosis to the medial border of the hallux left.  Vascular: Dorsalis Pedis artery and Posterior Tibial artery pedal pulses are 2/4 bilateral with immedate capillary fill time. Pedal hair growth present. No varicosities and no lower extremity edema present bilateral.   Neruologic: Grossly intact via light touch bilateral. Vibratory intact via tuning fork bilateral. Protective threshold with Semmes Wienstein monofilament intact to all pedal sites bilateral. Patellar and Achilles deep tendon reflexes 2+ bilateral. No Babinski or clonus  noted bilateral.   Musculoskeletal: No gross boney pedal deformities bilateral. No pain, crepitus, or limitation noted with foot and ankle range of motion bilateral. Muscular strength 5/5 in all groups tested bilateral.  Gait: Unassisted, Nonantalgic.    Radiographs:  None taken  Assessment & Plan:   Assessment: Tinea pedis nail dystrophy.  Plan: Samples of the skin and nail were taken today for pathologic evaluation.  I will start her on Lamisil 250 mg tablets 1 p.o. daily #30 and I will follow-up with her in 1 month     Gail [name] T. [place], [place]

## 2023-01-31 ENCOUNTER — Ambulatory Visit (INDEPENDENT_AMBULATORY_CARE_PROVIDER_SITE_OTHER): Payer: 59 | Admitting: Obstetrics & Gynecology

## 2023-01-31 ENCOUNTER — Other Ambulatory Visit (HOSPITAL_COMMUNITY)
Admission: RE | Admit: 2023-01-31 | Discharge: 2023-01-31 | Disposition: A | Discharge status: Home / Self Care | Payer: 59 | Source: Ambulatory Visit | Attending: Obstetrics & Gynecology | Admitting: Obstetrics & Gynecology

## 2023-01-31 ENCOUNTER — Encounter: Payer: Self-pay | Admitting: Obstetrics & Gynecology

## 2023-01-31 VITALS — BP 108/72 | HR 100 | Resp 18 | Ht 60.63 in | Wt 111.8 lb

## 2023-01-31 DIAGNOSIS — Z113 Encounter for screening for infections with a predominantly sexual mode of transmission: Secondary | ICD-10-CM

## 2023-01-31 DIAGNOSIS — Z01419 Encounter for gynecological examination (general) (routine) without abnormal findings: Secondary | ICD-10-CM | POA: Insufficient documentation

## 2023-01-31 DIAGNOSIS — Z789 Other specified health status: Secondary | ICD-10-CM

## 2023-01-31 NOTE — Progress Notes (Signed)
Gail [name] 22-Jan-2001 [id number]   History:    22 y.o. G0 Single.  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   RP:  Established patient presenting for annual gyn exam    HPI:  Stopped Depo-Provera, last injection 05/2022.  Menses regular normal every month.  LMP 01/13/23.  No BTB.  No pelvic pain.  Had IC with condoms in the past, not currently sexually active. Pap reflex/Gono-Chlam, full STI screen Neg 01/2022. Breasts normal. BMI 21.38.   Physically active. Not on Anti-Depressants anymore.   Past medical history,surgical history, family history and social history were all reviewed and documented in the EPIC chart.  Gynecologic History Patient's last menstrual period was 01/13/2023 (approximate).  Obstetric History OB History  Gravida Para Term Preterm AB Living  0 0 0 0 0 0  SAB IAB Ectopic Multiple Live Births  0 0 0 0 0     ROS: A ROS was performed and pertinent positives and negatives are included in the history. GENERAL: No fevers or chills. HEENT: No change in vision, no earache, sore throat or sinus congestion. NECK: No pain or stiffness. CARDIOVASCULAR: No chest pain or pressure. No palpitations. PULMONARY: No shortness of breath, cough or wheeze. GASTROINTESTINAL: No abdominal pain, nausea, vomiting or diarrhea, melena or bright red blood per rectum. GENITOURINARY: No urinary frequency, urgency, hesitancy or dysuria. MUSCULOSKELETAL: No joint or muscle pain, no back pain, no recent trauma. DERMATOLOGIC: No rash, no itching, no lesions. ENDOCRINE: No polyuria, polydipsia, no heat or cold intolerance. No recent change in weight. HEMATOLOGICAL: No anemia or easy bruising or bleeding. NEUROLOGIC: No headache, seizures, numbness, tingling or weakness. PSYCHIATRIC: No depression, no loss of interest in normal activity or change in sleep pattern.     Exam:   BP 108/72 (BP Location: Right Arm, Patient Position: Sitting)   Pulse 100   Resp 18   Ht 5' 0.63" (1.54 m)   Wt 111  lb 12.8 oz (50.7 kg)   LMP 01/13/2023 (Approximate)   SpO2 98%   BMI 21.38 kg/m   Body mass index is 21.38 kg/m.  General appearance : Well developed well nourished female. No acute distress HEENT: Eyes: no retinal hemorrhage or exudates,  Neck supple, trachea midline, no carotid bruits, no thyroidmegaly Lungs: Clear to auscultation, no rhonchi or wheezes, or rib retractions  Heart: Regular rate and rhythm, no murmurs or gallops Breast:Examined in sitting and supine position were symmetrical in appearance, no palpable masses or tenderness,  no skin retraction, no nipple inversion, no nipple discharge, no skin discoloration, no axillary or supraclavicular lymphadenopathy Abdomen: no palpable masses or tenderness, no rebound or guarding Extremities: no edema or skin discoloration or tenderness  Pelvic: Vulva: Normal             Vagina: No gross lesions or discharge  Cervix: No gross lesions or discharge  Uterus  AV, normal size, shape and consistency, non-tender and mobile  Adnexa  Without masses or tenderness  Anus: Normal   Assessment/Plan:  22 y.o. female for annual exam   1. Encounter for routine gynecological examination with Papanicolaou smear of cervix Stopped Depo-Provera, last injection 05/2022.  Menses regular normal every month.  LMP 01/13/23.  No BTB.  No pelvic pain.  Had IC with condoms in the past, not currently sexually active. Pap reflex/Gono-Chlam, full STI screen Neg 01/2022. Breasts normal. BMI 21.38.   Physically active. Not on Anti-Depressants anymore. - Cytology - PAP( [place])  2. Use of condoms for contraception  Declines other types of contraception.  Currently abstinent.  3. Screen for STD (sexually transmitted disease) - HIV antibody (with reflex) - RPR - Hepatitis B Surface AntiGEN - Hepatitis C Antibody - Cytology - PAP( [employer]) with Liston Alba MD, 8:45 AM

## 2023-02-01 LAB — RPR: RPR Ser Ql: NONREACTIVE

## 2023-02-01 LAB — HEPATITIS B SURFACE ANTIGEN: Hepatitis B Surface Ag: NONREACTIVE

## 2023-02-01 LAB — HEPATITIS C ANTIBODY: Hepatitis C Ab: NONREACTIVE

## 2023-02-01 LAB — HIV ANTIBODY (ROUTINE TESTING W REFLEX): HIV 1&2 Ab, 4th Generation: NONREACTIVE

## 2023-02-03 LAB — CYTOLOGY - PAP
Chlamydia: NEGATIVE
Comment: NEGATIVE
Comment: NORMAL
Diagnosis: NEGATIVE
Neisseria Gonorrhea: NEGATIVE

## 2023-03-11 ENCOUNTER — Ambulatory Visit (INDEPENDENT_AMBULATORY_CARE_PROVIDER_SITE_OTHER): Payer: 59 | Admitting: Podiatry

## 2023-03-11 DIAGNOSIS — L603 Nail dystrophy: Secondary | ICD-10-CM | POA: Diagnosis not present

## 2023-03-11 MED ORDER — TERBINAFINE HCL 250 MG PO TABS: 250.0000 mg | ORAL_TABLET | Freq: Every day | ORAL | 0 refills | Status: DC

## 2023-03-12 NOTE — Progress Notes (Signed)
She presents today for follow-up of her nail fungus and the results of her biopsy.  She had no pain or problems taking the first 30 days of Lamisil.  Objective: No changes on physical exam.  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  Assessment: Onychomycosis confirmed.  Plan: Another 90 days of Lamisil.

## 2023-03-24 ENCOUNTER — Encounter: Payer: 59 | Admitting: Nurse Practitioner

## 2023-03-24 ENCOUNTER — Telehealth: Payer: Self-pay | Admitting: Nurse Practitioner

## 2023-03-24 NOTE — Telephone Encounter (Signed)
8.5.24 no show letter sent

## 2023-03-27 NOTE — Telephone Encounter (Signed)
1st missed visit since 08/2021, letter sent

## 2023-04-01 ENCOUNTER — Telehealth: Payer: Self-pay | Admitting: Nurse Practitioner

## 2023-04-01 NOTE — Telephone Encounter (Signed)
Pt would like for you give her a call. Pt has questions about the depo birth control shot

## 2023-04-01 NOTE — Telephone Encounter (Signed)
Called and talked to patient. She is considering getting the Depo provera injections again and had some questions.  1) What are the side affects? 2) Will this have any affects on fertility in the future?

## 2023-04-02 NOTE — Telephone Encounter (Signed)
Called and talked to patient.  Made her aware of Charlotte's instructions to contact GYN.

## 2023-04-03 ENCOUNTER — Encounter (INDEPENDENT_AMBULATORY_CARE_PROVIDER_SITE_OTHER): Payer: Self-pay

## 2023-05-12 ENCOUNTER — Encounter: Payer: 59 | Admitting: Nurse Practitioner

## 2023-07-09 ENCOUNTER — Ambulatory Visit: Payer: 59 | Admitting: Podiatry

## 2023-09-22 ENCOUNTER — Encounter: Payer: Self-pay | Admitting: Nurse Practitioner

## 2023-09-22 ENCOUNTER — Ambulatory Visit: Payer: 59 | Admitting: Nurse Practitioner

## 2023-09-22 ENCOUNTER — Other Ambulatory Visit (HOSPITAL_COMMUNITY)
Admission: RE | Admit: 2023-09-22 | Discharge: 2023-09-22 | Disposition: A | Discharge status: Home / Self Care | Payer: 59 | Source: Ambulatory Visit | Attending: Nurse Practitioner | Admitting: Nurse Practitioner

## 2023-09-22 VITALS — BP 100/60 | HR 84 | Temp 98.1°F | Ht 61.0 in | Wt 113.2 lb

## 2023-09-22 DIAGNOSIS — Z0001 Encounter for general adult medical examination with abnormal findings: Secondary | ICD-10-CM

## 2023-09-22 DIAGNOSIS — Z1159 Encounter for screening for other viral diseases: Secondary | ICD-10-CM

## 2023-09-22 DIAGNOSIS — Z23 Encounter for immunization: Secondary | ICD-10-CM

## 2023-09-22 DIAGNOSIS — Z30013 Encounter for initial prescription of injectable contraceptive: Secondary | ICD-10-CM

## 2023-09-22 DIAGNOSIS — Z0000 Encounter for general adult medical examination without abnormal findings: Secondary | ICD-10-CM

## 2023-09-22 LAB — CBC
HCT: 38.3 % (ref 36.0–46.0)
Hemoglobin: 12.5 g/dL (ref 12.0–15.0)
MCHC: 32.6 g/dL (ref 30.0–36.0)
MCV: 96.4 fL (ref 78.0–100.0)
Platelets: 329 10*3/uL (ref 150.0–400.0)
RBC: 3.98 Mil/uL (ref 3.87–5.11)
RDW: 13.5 % (ref 11.5–15.5)
WBC: 5.3 10*3/uL (ref 4.0–10.5)

## 2023-09-22 LAB — COMPREHENSIVE METABOLIC PANEL
ALT: 15 U/L (ref 0–35)
AST: 19 U/L (ref 0–37)
Albumin: 4 g/dL (ref 3.5–5.2)
Alkaline Phosphatase: 43 U/L (ref 39–117)
BUN: 8 mg/dL (ref 6–23)
CO2: 26 meq/L (ref 19–32)
Calcium: 8.6 mg/dL (ref 8.4–10.5)
Chloride: 105 meq/L (ref 96–112)
Creatinine, Ser: 0.69 mg/dL (ref 0.40–1.20)
GFR: 122.5 mL/min (ref >60.00)
Glucose, Bld: 80 mg/dL (ref 70–99)
Potassium: 4.5 meq/L (ref 3.5–5.1)
Sodium: 137 meq/L (ref 135–145)
Total Bilirubin: 1.5 mg/dL — ABNORMAL HIGH (ref 0.2–1.2)
Total Protein: 6.7 g/dL (ref 6.0–8.3)

## 2023-09-22 LAB — TSH: TSH: 0.59 u[IU]/mL (ref 0.35–5.50)

## 2023-09-22 LAB — POCT URINE PREGNANCY: Preg Test, Ur: NEGATIVE

## 2023-09-22 MED ORDER — MEDROXYPROGESTERONE ACETATE 150 MG/ML IM SUSP
150.0000 mg | Freq: Once | INTRAMUSCULAR | Status: AC
  Administered 2023-09-22: 150 mg via INTRAMUSCULAR

## 2023-09-22 NOTE — Progress Notes (Signed)
Complete physical exam  Patient: Gail [name]   DOB: 09-23-2000   [name]  MRN: [id number] Visit Date: 09/22/2023  Subjective:    Chief Complaint  Patient presents with   Annual Exam    Need for STD screen and contraception. Request to start, Flu vaccine   Gail [name] is a 23 y.o. female who presents today for a complete physical exam. She reports consuming a general diet.  Exercise 2x/week  She generally feels well. She reports sleeping well. She does have additional problems to discuss today.  Vision:no Dental:No STD Screen:Yes  BP Readings from Last 3 Encounters:  09/22/23 100/60  01/31/23 108/72  03/21/22 110/60   Wt Readings from Last 3 Encounters:  09/22/23 113 lb 3.2 oz (51.3 kg)  01/31/23 111 lb 12.8 oz (50.7 kg)  03/21/22 111 lb 9.6 oz (50.6 kg)   Most recent fall risk assessment:    09/22/2023    9:22 AM  Fall Risk   Falls in the past year? 0  Number falls in past yr: 0  Injury with Fall? 0  Risk for fall due to : No Fall Risks  Follow up Falls evaluation completed     Depression screen:Yes - No Depression Most recent depression screenings:    09/22/2023    9:22 AM 03/21/2022   10:36 AM  PHQ 2/9 Scores  PHQ - 2 Score 0 2  PHQ- 9 Score 3 2    HPI  Contraception management Wants to resume use of depo provera. UTD with PAP-2024 normal She agreed to STD screen today  LMP 09/20/2023, has menstrual cycle every 28-30days Current use of condoms  Negative urine pregnancy today Administered deprovera injection Advised to continue use of condoms and schedule nurse visit for depo Provera injection every 90days Repeat PAP 2027   Past Medical History:  Diagnosis Date   Anxiety    Depression    Medical history non-contributory    Suicide ideation 04/28/2018   Vision abnormalities    Past Surgical History:  Procedure Laterality Date   WISDOM TOOTH EXTRACTION     Social History   Socioeconomic History   Marital  status: Single    Spouse name: Not on file   Number of children: 0   Years of education: Not on file   Highest education level: Some college, no degree  Occupational History   Not on file  Tobacco Use   Smoking status: Never   Smokeless tobacco: Never  Vaping Use   Vaping status: Some Days   Start date: 08/21/2020   Substances: CBD  Substance and Sexual Activity   Alcohol use: Yes    Comment: Rare   Drug use: Not Currently    Frequency: 1.0 times per week   Sexual activity: Not Currently    Birth control/protection: Abstinence    Comment: 1st intercourse- 16, partners- less than 5  Other Topics Concern   Not on file  Social History Narrative   Not on file   Social Drivers of Health   Financial Resource Strain: Not on file  Food Insecurity: Not on file  Transportation Needs: Not on file  Physical Activity: Not on file  Stress: Not on file  Social Connections: Not on file  Intimate Partner Violence: Not on file   Family Status  Relation Name Status   Mother  Alive   Father  (Not Specified)   Sister  Alive   MGM  (Not Specified)   Cousin  Deceased  No partnership data on file   Family History  Problem Relation Age of Onset   Rheum arthritis Mother    Lupus Mother    Obesity Mother    Hypertension Mother    Kidney disease Mother    Diabetes Father    Bipolar disorder Sister    Healthy Sister    Breast cancer Maternal Grandmother    Cancer Maternal Grandmother        breast cancer   Diabetes Maternal Grandmother    Hypertension Maternal Grandmother    Hyperlipidemia Maternal Grandmother    Breast cancer Cousin    Post-traumatic stress disorder Cousin    No Known Allergies  Patient Care Team: Maddyx Wieck, Bonna Gains, NP as PCP - General (Internal Medicine) Genia Del, MD as Consulting Physician (Obstetrics and Gynecology)   Medications: Outpatient Medications Prior to Visit  Medication Sig   medroxyPROGESTERone (DEPO-PROVERA) 150 MG/ML injection  Inject 1 mL (150 mg total) into the muscle every 3 (three) months. Needs to maintain annual visits with pcp   [DISCONTINUED] terbinafine (LAMISIL) 250 MG tablet Take 1 tablet (250 mg total) by mouth daily. (Patient not taking: Reported on 09/22/2023)   [DISCONTINUED] terbinafine (LAMISIL) 250 MG tablet Take 1 tablet (250 mg total) by mouth daily. (Patient not taking: Reported on 09/22/2023)   No facility-administered medications prior to visit.    Review of Systems  Constitutional:  Negative for activity change, appetite change and unexpected weight change.  Respiratory: Negative.    Cardiovascular: Negative.   Gastrointestinal: Negative.   Endocrine: Negative for cold intolerance and heat intolerance.  Genitourinary: Negative.   Musculoskeletal: Negative.   Skin: Negative.   Neurological: Negative.   Hematological: Negative.   Psychiatric/Behavioral:  Negative for behavioral problems, decreased concentration, dysphoric mood, hallucinations, self-injury, sleep disturbance and suicidal ideas. The patient is not nervous/anxious.        Objective:  BP 100/60 (BP Location: Right Arm, Patient Position: Sitting, Cuff Size: Small)   Pulse 84   Temp 98.1 F (36.7 C)   Ht 5\' 1"  (1.549 m)   Wt 113 lb 3.2 oz (51.3 kg)   LMP 09/21/2023   SpO2 100%   BMI 21.39 kg/m     Physical Exam Vitals and nursing note reviewed.  Constitutional:      General: She is not in acute distress. HENT:     Right Ear: Tympanic membrane, ear canal and external ear normal.     Left Ear: Tympanic membrane, ear canal and external ear normal.     Nose: Nose normal.  Eyes:     Extraocular Movements: Extraocular movements intact.     Conjunctiva/sclera: Conjunctivae normal.     Pupils: Pupils are equal, round, and reactive to light.  Neck:     Thyroid: No thyroid mass, thyromegaly or thyroid tenderness.  Cardiovascular:     Rate and Rhythm: Normal rate and regular rhythm.     Pulses: Normal pulses.     Heart  sounds: Normal heart sounds.  Pulmonary:     Effort: Pulmonary effort is normal.     Breath sounds: Normal breath sounds.  Abdominal:     General: Bowel sounds are normal.     Palpations: Abdomen is soft.  Musculoskeletal:        General: Normal range of motion.     Cervical back: Normal range of motion and neck supple.     Right lower leg: No edema.     Left lower leg: No edema.  Lymphadenopathy:  Cervical: No cervical adenopathy.  Skin:    General: Skin is warm and dry.  Neurological:     Mental Status: She is alert and oriented to person, place, and time.     Cranial Nerves: No cranial nerve deficit.  Psychiatric:        Mood and Affect: Mood normal.        Behavior: Behavior normal.        Thought Content: Thought content normal.      Results for orders placed or performed in visit on 09/22/23  POCT urine pregnancy  Result Value Ref Range   Preg Test, Ur Negative Negative      Assessment & Plan:    Routine Health Maintenance and Physical Exam  Immunization History  Administered Date(s) Administered   HPV 9-valent 03/21/2022   Hpv-Unspecified 09/13/2010, 09/16/2010   Influenza, Seasonal, Injecte, Preservative Fre 09/22/2023   Influenza,inj,Quad PF,6+ Mos 04/29/2018, 06/07/2019, 08/08/2021   PFIZER(Purple Top)SARS-COV-2 Vaccination 03/14/2020, 04/02/2020   Tdap 07/15/2011, 03/21/2022   Health Maintenance  Topic Date Due   COVID-19 Vaccine (3 - 2024-25 season) 10/08/2023 (Originally 04/20/2023)   Cervical Cancer Screening (Pap smear)  01/30/2026   DTaP/Tdap/Td (3 - Td or Tdap) 03/21/2032   INFLUENZA VACCINE  Completed   HPV VACCINES  Completed   Hepatitis C Screening  Completed   HIV Screening  Completed   Discussed health benefits of physical activity, and encouraged her to engage in regular exercise appropriate for her age and condition.  Problem List Items Addressed This Visit     Contraception management   Wants to resume use of depo provera. UTD  with PAP-2024 normal She agreed to STD screen today  LMP 09/20/2023, has menstrual cycle every 28-30days Current use of condoms  Negative urine pregnancy today Administered deprovera injection Advised to continue use of condoms and schedule nurse visit for depo Provera injection every 90days Repeat PAP 2027      Relevant Medications   medroxyPROGESTERone (DEPO-PROVERA) 150 MG/ML injection   Other Relevant Orders   POCT urine pregnancy (Completed)   Other Visit Diagnoses       Encounter for preventative adult health care exam with abnormal findings    -  Primary   Relevant Orders   CBC   Comprehensive metabolic panel   TSH     Encounter for screening for viral disease       Relevant Orders   Urine cytology ancillary only   HIV Antibody (routine testing w rflx)   RPR     Immunization due       Relevant Orders   Flu vaccine trivalent PF, 6mos and older(Flulaval,Afluria,Fluarix,Fluzone) (Completed)      Return in about 1 year (around 09/21/2024) for CPE (fasting).     Alysia Penna, NP

## 2023-09-22 NOTE — Patient Instructions (Signed)
Go to lab Maintain Heart healthy diet and daily exercise. Schedule nurse visit for depoProvera injection every 3months. Continue use of condoms. Ok to use colace or miralax as needed for constipation  Preventive Care 74-24 Years Old, Female Preventive care refers to lifestyle choices and visits with your health care provider that can promote health and wellness. Preventive care visits are also called wellness exams. What can I expect for my preventive care visit? Counseling During your preventive care visit, your health care provider may ask about your: Medical history, including: Past medical problems. Family medical history. Pregnancy history. Current health, including: Menstrual cycle. Method of birth control. Emotional well-being. Home life and relationship well-being. Sexual activity and sexual health. Lifestyle, including: Alcohol, nicotine or tobacco, and drug use. Access to firearms. Diet, exercise, and sleep habits. Work and work Astronomer. Sunscreen use. Safety issues such as seatbelt and bike helmet use. Physical exam Your health care provider may check your: Height and weight. These may be used to calculate your BMI (body mass index). BMI is a measurement that tells if you are at a healthy weight. Waist circumference. This measures the distance around your waistline. This measurement also tells if you are at a healthy weight and may help predict your risk of certain diseases, such as type 2 diabetes and high blood pressure. Heart rate and blood pressure. Body temperature. Skin for abnormal spots. What immunizations do I need?  Vaccines are usually given at various ages, according to a schedule. Your health care provider will recommend vaccines for you based on your age, medical history, and lifestyle or other factors, such as travel or where you work. What tests do I need? Screening Your health care provider may recommend screening tests for certain conditions.  This may include: Pelvic exam and Pap test. Lipid and cholesterol levels. Diabetes screening. This is done by checking your blood sugar (glucose) after you have not eaten for a while (fasting). Hepatitis B test. Hepatitis C test. HIV (human immunodeficiency virus) test. STI (sexually transmitted infection) testing, if you are at risk. BRCA-related cancer screening. This may be done if you have a family history of breast, ovarian, tubal, or peritoneal cancers. Talk with your health care provider about your test results, treatment options, and if necessary, the need for more tests. Follow these instructions at home: Eating and drinking  Eat a healthy diet that includes fresh fruits and vegetables, whole grains, lean protein, and low-fat dairy products. Take vitamin and mineral supplements as recommended by your health care provider. Do not drink alcohol if: Your health care provider tells you not to drink. You are pregnant, may be pregnant, or are planning to become pregnant. If you drink alcohol: Limit how much you have to 0-1 drink a day. Know how much alcohol is in your drink. In the [place] one drink equals one 12 oz bottle of beer (355 mL), one 5 oz glass of wine (148 mL), or one 1 oz glass of hard liquor (44 mL). Lifestyle Brush your teeth every morning and night with fluoride toothpaste. Floss one time each day. Exercise for at least 30 minutes 5 or more days each week. Do not use any products that contain nicotine or tobacco. These products include cigarettes, chewing tobacco, and vaping devices, such as e-cigarettes. If you need help quitting, ask your health care provider. Do not use drugs. If you are sexually active, practice safe sex. Use a condom or other form of protection to prevent STIs. If you do not wish  to become pregnant, use a form of birth control. If you plan to become pregnant, see your health care provider for a prepregnancy visit. Find healthy ways to manage stress,  such as: Meditation, yoga, or listening to music. Journaling. Talking to a trusted person. Spending time with friends and family. Minimize exposure to UV radiation to reduce your risk of skin cancer. Safety Always wear your seat belt while driving or riding in a vehicle. Do not drive: If you have been drinking alcohol. Do not ride with someone who has been drinking. If you have been using any mind-altering substances or drugs. While texting. When you are tired or distracted. Wear a helmet and other protective equipment during sports activities. If you have firearms in your house, make sure you follow all gun safety procedures. Seek help if you have been physically or sexually abused. What's next? Go to your health care provider once a year for an annual wellness visit. Ask your health care provider how often you should have your eyes and teeth checked. Stay up to date on all vaccines. This information is not intended to replace advice given to you by your health care provider. Make sure you discuss any questions you have with your health care provider. Document Revised: 01/31/2021 Document Reviewed: 01/31/2021 Elsevier Patient Education  2024 [employer].

## 2023-09-22 NOTE — Assessment & Plan Note (Addendum)
Wants to resume use of depo provera. UTD with PAP-2024 normal She agreed to STD screen today  LMP 09/20/2023, has menstrual cycle every 28-30days Current use of condoms  Negative urine pregnancy today Administered deprovera injection Advised to continue use of condoms and schedule nurse visit for depo Provera injection every 90days Repeat PAP 2027

## 2023-09-23 ENCOUNTER — Encounter: Payer: Self-pay | Admitting: Nurse Practitioner

## 2023-09-23 LAB — HIV ANTIBODY (ROUTINE TESTING W REFLEX): HIV 1&2 Ab, 4th Generation: NONREACTIVE

## 2023-09-23 LAB — URINE CYTOLOGY ANCILLARY ONLY
Chlamydia: NEGATIVE
Comment: NEGATIVE
Comment: NEGATIVE
Comment: NORMAL
Neisseria Gonorrhea: NEGATIVE
Trichomonas: NEGATIVE

## 2023-09-23 LAB — RPR: RPR Ser Ql: NONREACTIVE

## 2023-12-15 ENCOUNTER — Ambulatory Visit

## 2023-12-15 ENCOUNTER — Ambulatory Visit (INDEPENDENT_AMBULATORY_CARE_PROVIDER_SITE_OTHER)

## 2023-12-15 ENCOUNTER — Ambulatory Visit (INDEPENDENT_AMBULATORY_CARE_PROVIDER_SITE_OTHER): Payer: 59 | Admitting: Nurse Practitioner

## 2023-12-15 ENCOUNTER — Other Ambulatory Visit: Payer: Self-pay | Admitting: Nurse Practitioner

## 2023-12-15 ENCOUNTER — Encounter: Payer: Self-pay | Admitting: Nurse Practitioner

## 2023-12-15 VITALS — BP 102/68 | HR 82 | Temp 98.3°F | Ht 61.0 in | Wt 114.2 lb

## 2023-12-15 DIAGNOSIS — Z3042 Encounter for surveillance of injectable contraceptive: Secondary | ICD-10-CM

## 2023-12-15 MED ORDER — MEDROXYPROGESTERONE ACETATE 150 MG/ML IM SUSP
150.0000 mg | Freq: Once | INTRAMUSCULAR | Status: AC
  Administered 2023-12-15: 150 mg via INTRAMUSCULAR

## 2023-12-15 NOTE — Progress Notes (Signed)
 Per orders of Kandace Organ, NP, injection of Depo-Provera   given by Stefani Edin in right deltoid. Patient tolerated injection well. Patient will make appointment for 3 month.

## 2023-12-16 NOTE — Progress Notes (Signed)
 This encounter was created in error - please disregard.

## 2023-12-30 ENCOUNTER — Ambulatory Visit (INDEPENDENT_AMBULATORY_CARE_PROVIDER_SITE_OTHER): Admitting: Podiatry

## 2023-12-30 DIAGNOSIS — L600 Ingrowing nail: Secondary | ICD-10-CM

## 2023-12-30 MED ORDER — NEOMYCIN-POLYMYXIN-HC 3.5-10000-1 OT SOLN: OTIC | 0 refills | Status: DC

## 2023-12-30 NOTE — Patient Instructions (Signed)

## 2023-12-30 NOTE — Progress Notes (Signed)
 She presents today with some chief complaint of a painful ingrown toenail to the first nail on her left foot.  She states that both corners are ingrown and they appear to have fungus.  States that the antifungal worked on all of the other nails but just not these.  Objective: Vital signs are stable alert oriented x 3 sharply abraded nail margins which are thickened and tender there is mild erythema tenderness on palpation no purulence no malodor.  Assessment: Ingrown nail to be inferior border of the hallux.  Plan: Chemical matricectomy was performed today after local anesthetic was administered.  She tolerated procedure well without complications was given both oral and written home-going instructions for the care and soaking of the toe.  She also received a prescription for Cortisporin Otic to be applied twice daily after soaking.  Follow-up with her in 1 to 2 weeks.

## 2024-01-02 ENCOUNTER — Encounter: Payer: Self-pay | Admitting: Nurse Practitioner

## 2024-01-02 ENCOUNTER — Ambulatory Visit (INDEPENDENT_AMBULATORY_CARE_PROVIDER_SITE_OTHER): Admitting: Nurse Practitioner

## 2024-01-02 VITALS — BP 106/60 | HR 95

## 2024-01-02 DIAGNOSIS — N898 Other specified noninflammatory disorders of vagina: Secondary | ICD-10-CM

## 2024-01-02 DIAGNOSIS — Z3042 Encounter for surveillance of injectable contraceptive: Secondary | ICD-10-CM | POA: Diagnosis not present

## 2024-01-02 DIAGNOSIS — Z113 Encounter for screening for infections with a predominantly sexual mode of transmission: Secondary | ICD-10-CM | POA: Diagnosis not present

## 2024-01-02 NOTE — Progress Notes (Signed)
   Acute Office Visit  Subjective:    Patient ID: Gail [name], female    DOB: Apr 09, 2001, 23 y.o.   MRN: [id number]   HPI 23 y.o. G0 presents today for brown discharge. Restarted Depo in February. Had period in March and has had daily brown spotting since that requires wearing a panty liner. Last dose 12/15/23. No vaginal itching or odor. Prior to this she was on depo from age 31 until August 2023. No period until Jan 2024, then cycles were regular until she restarted Depo a few months ago. Sexually active.   Patient's last menstrual period was 11/14/2023 (approximate).    Review of Systems  Constitutional: Negative.   Genitourinary:  Positive for vaginal bleeding and vaginal discharge. Negative for vaginal pain.       Objective:     Physical Exam Constitutional:      Appearance: Normal appearance.  Genitourinary:    General: Normal vulva.     Vagina: Vaginal discharge present. No erythema.     Cervix: Normal.     BP 106/60 (BP Location: Left Arm, Patient Position: Sitting)   Pulse 95   LMP 11/14/2023 (Approximate) Comment: On depo shot, states her period has not been regular since Febuary  SpO2 97%  Wt Readings from Last 3 Encounters:  12/15/23 114 lb 3.2 oz (51.8 kg)  09/22/23 113 lb 3.2 oz (51.3 kg)  01/31/23 111 lb 12.8 oz (50.7 kg)        Patient informed chaperone available to be present for breast and/or pelvic exam. Patient has requested no chaperone to be present. Patient has been advised what will be completed during breast and pelvic exam.   Assessment & Plan:   Problem List Items Addressed This Visit   None Visit Diagnoses       Screening examination for STD (sexually transmitted disease)    -  Primary   Relevant Orders   RPR   SureSwab Advanced Vaginitis Plus,TMA   HIV Antibody (routine testing w rflx)     Vaginal discharge       Relevant Orders   SureSwab Advanced Vaginitis Plus,TMA     Surveillance for Depo-Provera   contraception          Plan: Vaginitis/STD panel pending. Discussed Depo and bleeding that can occur with use, especially first 3-6 months.   Return if symptoms worsen or fail to improve.    Andee Bamberger DNP, 2:21 PM 01/02/2024

## 2024-01-03 LAB — HIV ANTIBODY (ROUTINE TESTING W REFLEX): HIV 1&2 Ab, 4th Generation: NONREACTIVE

## 2024-01-03 LAB — SURESWAB® ADVANCED VAGINITIS PLUS,TMA
C. trachomatis RNA, TMA: NOT DETECTED
CANDIDA SPECIES: NOT DETECTED
Candida glabrata: NOT DETECTED
N. gonorrhoeae RNA, TMA: NOT DETECTED
SURESWAB(R) ADV BACTERIAL VAGINOSIS(BV),TMA: NEGATIVE
TRICHOMONAS VAGINALIS (TV),TMA: NOT DETECTED

## 2024-01-03 LAB — RPR: RPR Ser Ql: NONREACTIVE

## 2024-01-05 ENCOUNTER — Ambulatory Visit: Payer: Self-pay | Admitting: Nurse Practitioner

## 2024-01-13 ENCOUNTER — Encounter: Payer: Self-pay | Admitting: Podiatry

## 2024-01-13 ENCOUNTER — Ambulatory Visit (INDEPENDENT_AMBULATORY_CARE_PROVIDER_SITE_OTHER): Admitting: Podiatry

## 2024-01-13 DIAGNOSIS — L600 Ingrowing nail: Secondary | ICD-10-CM

## 2024-01-13 NOTE — Progress Notes (Signed)
 She presents today for 2-week follow-up of her matrixectomy hallux left.  She denies fever chills nausea vomiting muscle aches pains calf pain back pain chest pain shortness of breath.  States that she continues to soak in antibacterial soap and warm water.  Objective: Vital signs are stable alert and oriented x 3.  Pulses are palpable.  There is no erythema edema salines drainage or odor assessment is right with surgical toe.  Plan: Follow-up with her as needed.

## 2024-02-04 ENCOUNTER — Ambulatory Visit (INDEPENDENT_AMBULATORY_CARE_PROVIDER_SITE_OTHER): Admitting: Radiology

## 2024-02-04 ENCOUNTER — Encounter: Payer: Self-pay | Admitting: Radiology

## 2024-02-04 VITALS — BP 100/60 | Ht 60.63 in | Wt 113.8 lb

## 2024-02-04 DIAGNOSIS — Z1331 Encounter for screening for depression: Secondary | ICD-10-CM | POA: Diagnosis not present

## 2024-02-04 DIAGNOSIS — Z3042 Encounter for surveillance of injectable contraceptive: Secondary | ICD-10-CM | POA: Diagnosis not present

## 2024-02-04 DIAGNOSIS — Z01419 Encounter for gynecological examination (general) (routine) without abnormal findings: Secondary | ICD-10-CM

## 2024-02-04 NOTE — Progress Notes (Signed)
 Gail [name] 10/09/2000 [id number]   History:  23 y.o. G0 presents for annual exam. Np gyn concerns. On depo for [place], gets at PCP. Negative STI screen last month, no new partners.  Gynecologic History No LMP recorded. Patient has had an injection.   Contraception/Family planning: Depo-Provera  injections Sexually active: yes Last Pap: 2024. Results were: normal   Obstetric History OB History  Gravida Para Term Preterm AB Living  0 0 0 0 0 0  SAB IAB Ectopic Multiple Live Births  0 0 0 0 0       09/22/2023    9:22 AM 03/21/2022   10:36 AM 08/21/2021   10:40 AM  Depression screen PHQ 2/9  Decreased Interest 0 1   Down, Depressed, Hopeless 0 1   PHQ - 2 Score 0 2   Altered sleeping 1 0   Tired, decreased energy 1 0   Change in appetite 0 0   Feeling bad or failure about yourself  0 0   Trouble concentrating 1 0   Moving slowly or fidgety/restless 0 0   Suicidal thoughts 0 0   PHQ-9 Score 3 2   Difficult doing work/chores Not difficult at all Not difficult at all      Information is confidential and restricted. Go to Review Flowsheets to unlock data.     The following portions of the patient's history were reviewed and updated as appropriate: allergies, current medications, past family history, past medical history, past social history, past surgical history, and problem list.  Review of Systems  All other systems reviewed and are negative.   Past medical history, past surgical history, family history and social history were all reviewed and documented in the EPIC chart.  Exam:  Vitals:   02/04/24 1036  BP: 100/60  SpO2: 99%  Weight: 113 lb 12.8 oz (51.6 kg)  Height: 5' 0.63 (1.54 m)   Body mass index is 21.77 kg/m.  Physical Exam Vitals and nursing note reviewed. Exam conducted with a chaperone present.  Constitutional:      Appearance: Normal appearance. She is normal weight.  HENT:     Head: Normocephalic and atraumatic.  Neck:      Thyroid : No thyroid  mass, thyromegaly or thyroid  tenderness.   Cardiovascular:     Rate and Rhythm: Regular rhythm.     Heart sounds: Normal heart sounds.  Pulmonary:     Effort: Pulmonary effort is normal.     Breath sounds: Normal breath sounds.  Chest:  Breasts:    Breasts are symmetrical.     Right: Normal. No inverted nipple, mass, nipple discharge, skin change or tenderness.     Left: Normal. No inverted nipple, mass, nipple discharge, skin change or tenderness.  Abdominal:     General: Abdomen is flat. Bowel sounds are normal.     Palpations: Abdomen is soft.  Genitourinary:    General: Normal vulva.     Vagina: Normal. No vaginal discharge, bleeding or lesions.     Cervix: Normal. No discharge or lesion.     Uterus: Normal. Not enlarged and not tender.      Adnexa: Right adnexa normal and left adnexa normal.       Right: No mass, tenderness or fullness.         Left: No mass, tenderness or fullness.    Lymphadenopathy:     Upper Body:     Right upper body: No axillary adenopathy.     Left upper body: No axillary  adenopathy.   Skin:    General: Skin is warm and dry.   Neurological:     Mental Status: She is alert and oriented to person, place, and time.   Psychiatric:        Mood and Affect: Mood normal.        Thought Content: Thought content normal.        Judgment: Judgment normal.      Ellis Guys, CMA present for exam  Assessment/Plan:   1. Well woman exam with routine gynecological exam (Primary) Pap 2027  2. On Depo-Provera  for contraception Receives at PCP, next appt 7/25  3. Depression screening negative    Discussed SBE, colonoscopy and DEXA screening as directed/appropriate. Recommend of exercise weekly, including weight bearing exercise. Encouraged the use of seatbelts and sunscreen.  No follow-ups on file.  Synetta Eves B WHNP-BC 10:54 AM 02/04/2024

## 2024-02-04 NOTE — Patient Instructions (Signed)

## 2024-03-16 ENCOUNTER — Ambulatory Visit (INDEPENDENT_AMBULATORY_CARE_PROVIDER_SITE_OTHER)

## 2024-03-16 DIAGNOSIS — Z3042 Encounter for surveillance of injectable contraceptive: Secondary | ICD-10-CM | POA: Diagnosis not present

## 2024-03-16 MED ORDER — MEDROXYPROGESTERONE ACETATE 150 MG/ML IM SUSP
150.0000 mg | INTRAMUSCULAR | Status: AC
  Administered 2024-03-16: 150 mg via INTRAMUSCULAR

## 2024-03-16 NOTE — Progress Notes (Signed)
 Per orders of Roselie Mood, NP, injection of Depo-Provera   given by Joeseph Piety in left deltoid. Patient tolerated injection well. Patient will make appointment for 3 month.

## 2024-06-01 ENCOUNTER — Ambulatory Visit (INDEPENDENT_AMBULATORY_CARE_PROVIDER_SITE_OTHER): Admitting: Nurse Practitioner

## 2024-06-01 VITALS — BP 108/60 | HR 92 | Temp 98.4°F | Ht 61.0 in | Wt 117.2 lb

## 2024-06-01 DIAGNOSIS — K529 Noninfective gastroenteritis and colitis, unspecified: Secondary | ICD-10-CM | POA: Diagnosis not present

## 2024-06-01 LAB — COMPREHENSIVE METABOLIC PANEL WITH GFR
ALT: 11 U/L (ref 0–35)
AST: 16 U/L (ref 0–37)
Albumin: 4.2 g/dL (ref 3.5–5.2)
Alkaline Phosphatase: 50 U/L (ref 39–117)
BUN: 10 mg/dL (ref 6–23)
CO2: 28 meq/L (ref 19–32)
Calcium: 9.2 mg/dL (ref 8.4–10.5)
Chloride: 104 meq/L (ref 96–112)
Creatinine, Ser: 0.81 mg/dL (ref 0.40–1.20)
GFR: 101.97 mL/min (ref >60.00)
Glucose, Bld: 83 mg/dL (ref 70–99)
Potassium: 3.7 meq/L (ref 3.5–5.1)
Sodium: 138 meq/L (ref 135–145)
Total Bilirubin: 1.1 mg/dL (ref 0.2–1.2)
Total Protein: 7.2 g/dL (ref 6.0–8.3)

## 2024-06-01 LAB — CBC
HCT: 39.9 % (ref 36.0–46.0)
Hemoglobin: 13.1 g/dL (ref 12.0–15.0)
MCHC: 32.7 g/dL (ref 30.0–36.0)
MCV: 94 fl (ref 78.0–100.0)
Platelets: 300 K/uL (ref 150.0–400.0)
RBC: 4.24 Mil/uL (ref 3.87–5.11)
RDW: 12.7 % (ref 11.5–15.5)
WBC: 4.8 K/uL (ref 4.0–10.5)

## 2024-06-01 LAB — LIPASE: Lipase: 40 U/L (ref 11.0–59.0)

## 2024-06-01 NOTE — Patient Instructions (Signed)
 Maintain Brat diet and adequate oral hydration Ok to use peptobismol as directed on package for abdominal pain and diarrhea. Collect stool sample prior to using peptobismol.  Diarrhea, Adult Diarrhea is when you pass loose and sometimes watery poop (stool) often. Diarrhea can make you feel weak and cause you to lose water in your body (get dehydrated). Losing water in your body can cause you to: Feel tired and thirsty. Have a dry mouth. Go pee (urinate) less often. Diarrhea often lasts 2-3 days. It can last longer if it is a sign of something more serious. Be sure to treat your diarrhea as told by your doctor. Follow these instructions at home: Eating and drinking     Follow these instructions as told by your doctor: Take an ORS (oral rehydration solution). This is a drink that helps you replace fluids and minerals your body lost. It is sold at pharmacies and stores. Drink enough fluid to keep your pee (urine) pale yellow. Drink fluids such as: Water. You can also get fluids by sucking on ice chips. Diluted fruit juice. Low-calorie sports drinks. Milk. Avoid drinking fluids that have a lot of sugar or caffeine in them. These include soda, energy drinks, and regular sports drinks. Avoid alcohol. Eat bland, easy-to-digest foods in small amounts as you are able. These foods include: Bananas. Applesauce. Rice. Low-fat (lean) meats. Toast. Crackers. Avoid spicy or fatty foods.  Medicines Take over-the-counter and prescription medicines only as told by your doctor. If you were prescribed antibiotics, take them as told by your doctor. Do not stop taking them even if you start to feel better. General instructions  Wash your hands often using soap and water for 20 seconds. If soap and water are not available, use hand sanitizer. Others in your home should wash their hands as well. Wash your hands: After using the toilet or changing a diaper. Before preparing, cooking, or serving  food. While caring for a sick person. While visiting someone in a hospital. Rest at home while you get better. Take a warm bath to help with any burning or pain from having diarrhea. Watch your condition for any changes. Contact a doctor if: You have a fever. Your diarrhea gets worse. You have new symptoms. You vomit every time you eat or drink. You feel light-headed, dizzy, or you have a headache. You have muscle cramps. You have signs of losing too much water in your body, such as: Dark pee, very little pee, or no pee. Cracked lips. Dry mouth. Sunken eyes. Sleepiness. Weakness. You have bloody or black poop or poop that looks like tar. You have very bad pain, cramping, or bloating in your belly (abdomen). Your skin feels cold and clammy. You feel confused. Get help right away if: You have chest pain. Your heart is beating very quickly. You have trouble breathing or you are breathing very quickly. You feel very weak or you faint. These symptoms may be an emergency. Get help right away. Call 911. Do not wait to see if the symptoms will go away. Do not drive yourself to the hospital. This information is not intended to replace advice given to you by your health care provider. Make sure you discuss any questions you have with your health care provider. Document Revised: 01/22/2022 Document Reviewed: 01/22/2022 Elsevier Patient Education  2024 [employer].

## 2024-06-01 NOTE — Progress Notes (Signed)
 Established Patient Visit  Patient: Gail [name]   DOB: 04-02-2001   23 y.o. Female  MRN: [id number] Visit Date: 06/01/2024  Subjective:    Chief Complaint  Patient presents with   Acute Visit    Discuss stomach issues-Every time that I get eat I have diarrhea like for 1 month    Diarrhea  This is a new problem. The current episode started 1 to 4 weeks ago. The problem occurs 2 to 4 times per day. The problem has been unchanged. The stool consistency is described as Watery. The patient states that diarrhea does not awaken her from sleep. Associated symptoms include abdominal pain, bloating and a URI. Pertinent negatives include no arthralgias, chills, coughing, fever, headaches, increased  flatus, myalgias, sweats, vomiting or weight loss. Exacerbated by: any food. Risk factors include ill contacts. She has tried nothing for the symptoms. There is no history of bowel resection, inflammatory bowel disease, irritable bowel syndrome, malabsorption, a recent abdominal surgery or short gut syndrome.  Reports URI with diarrhea 60month ago, but URI now resolved  Reviewed medical, surgical, and social history today  Medications: Outpatient Medications Prior to Visit  Medication Sig   medroxyPROGESTERone  (DEPO-PROVERA ) 150 MG/ML injection Inject 1 mL (150 mg total) into the muscle every 3 (three) months. Needs to maintain annual visits with pcp   [DISCONTINUED] neomycin -polymyxin-hydrocortisone (CORTISPORIN) OTIC solution Apply one to two drops to toe after soaking twice daily. (Patient not taking: Reported on 06/01/2024)   Facility-Administered Medications Prior to Visit  Medication Dose Route Frequency Provider   medroxyPROGESTERone  (DEPO-PROVERA ) injection 150 mg  150 mg Intramuscular Q90 days Izola Teague, Roselie Rockford, NP   Reviewed past medical and social history.   ROS per HPI above      Objective:  BP 108/60 (BP Location: Left Arm, Patient Position: Sitting,  Cuff Size: Normal)   Pulse 92   Temp 98.4 F (36.9 C) (Oral)   Ht 5' 1 (1.549 m)   Wt 117 lb 3.2 oz (53.2 kg)   SpO2 97%   BMI 22.14 kg/m      Physical Exam Vitals and nursing note reviewed.  Cardiovascular:     Rate and Rhythm: Normal rate and regular rhythm.     Pulses: Normal pulses.  Pulmonary:     Effort: Pulmonary effort is normal.  Abdominal:     General: There is no distension.     Palpations: There is no mass.     Tenderness: There is abdominal tenderness in the periumbilical area and suprapubic area. There is no right CVA tenderness, left CVA tenderness, guarding or rebound.     Hernia: No hernia is present.  Neurological:     Mental Status: She is alert.     No results found for any visits on 06/01/24.    Assessment & Plan:    Problem List Items Addressed This Visit   None Visit Diagnoses       Gastroenteritis    -  Primary   Relevant Orders   CBC   Comprehensive metabolic panel with GFR   Lipase   Urinalysis w microscopic + reflex cultur   Stool, WBC/Lactoferrin   Clostridium difficile culture-fecal   GI pathogen panel by PCR, stool     Maintain Brat diet and adequate oral hydration Ok to use peptobismol as directed on package for abdominal pain and diarrhea. Collect stool sample prior to using peptobismol. Refer to GI  if persistent diarrhea and normal above labs.  Return if symptoms worsen or fail to improve.     Roselie Mood, NP

## 2024-06-01 NOTE — Progress Notes (Signed)
 Per orders of Roselie Mood, NP, injection of Depo-Provera   given by Joeseph Piety in left deltoid. Patient tolerated injection well. Patient will make appointment for 3 month (Dec 30-Jan 13).

## 2024-06-02 ENCOUNTER — Ambulatory Visit: Payer: Self-pay | Admitting: Nurse Practitioner

## 2024-06-02 LAB — URINALYSIS W MICROSCOPIC + REFLEX CULTURE
Bilirubin Urine: NEGATIVE
Glucose, UA: NEGATIVE
Hyaline Cast: NONE SEEN /LPF
Ketones, ur: NEGATIVE
Leukocyte Esterase: NEGATIVE
Nitrites, Initial: NEGATIVE
Protein, ur: NEGATIVE
Specific Gravity, Urine: 1.026 (ref 1.001–1.035)
WBC, UA: NONE SEEN /HPF (ref 0–5)
pH: 5.5 (ref 5.0–8.0)

## 2024-06-02 LAB — NO CULTURE INDICATED

## 2024-08-25 ENCOUNTER — Ambulatory Visit

## 2024-08-25 DIAGNOSIS — Z3042 Encounter for surveillance of injectable contraceptive: Secondary | ICD-10-CM | POA: Diagnosis not present

## 2024-08-25 MED ORDER — MEDROXYPROGESTERONE ACETATE 150 MG/ML IM SUSP
150.0000 mg | INTRAMUSCULAR | Status: AC
  Administered 2024-08-25: 150 mg via INTRAMUSCULAR

## 2024-08-25 NOTE — Progress Notes (Signed)
 Per orders of [facility], NP/Dr. Rudd-DOD , injection of Depo-Provera   given by Joeseph Piety in Right deltoid Patient tolerated injection well. Patient will make appointment for 3 month(s) March 25-April 8

## 2024-09-23 ENCOUNTER — Encounter: Payer: 59 | Admitting: Nurse Practitioner

## 2024-11-17 ENCOUNTER — Ambulatory Visit

## 2025-02-04 ENCOUNTER — Ambulatory Visit: Admitting: Radiology
